# Patient Record
Sex: Male | Born: 1935 | Race: White | Hispanic: No | Marital: Married | Smoking: Former smoker
Health system: Southern US, Community
[De-identification: ages and names within clinical notes are randomized; demographics above are authoritative.]

## PROBLEM LIST (undated history)

## (undated) DIAGNOSIS — N400 Enlarged prostate without lower urinary tract symptoms: Secondary | ICD-10-CM

## (undated) DIAGNOSIS — G8929 Other chronic pain: Secondary | ICD-10-CM

## (undated) DIAGNOSIS — K635 Polyp of colon: Secondary | ICD-10-CM

## (undated) DIAGNOSIS — M545 Low back pain: Secondary | ICD-10-CM

## (undated) DIAGNOSIS — K222 Esophageal obstruction: Secondary | ICD-10-CM

## (undated) DIAGNOSIS — M1990 Unspecified osteoarthritis, unspecified site: Secondary | ICD-10-CM

## (undated) DIAGNOSIS — K219 Gastro-esophageal reflux disease without esophagitis: Secondary | ICD-10-CM

## (undated) DIAGNOSIS — G458 Other transient cerebral ischemic attacks and related syndromes: Secondary | ICD-10-CM

## (undated) DIAGNOSIS — E785 Hyperlipidemia, unspecified: Secondary | ICD-10-CM

## (undated) DIAGNOSIS — J339 Nasal polyp, unspecified: Secondary | ICD-10-CM

## (undated) DIAGNOSIS — I10 Essential (primary) hypertension: Secondary | ICD-10-CM

## (undated) HISTORY — DX: Nasal polyp, unspecified: J33.9

## (undated) HISTORY — DX: Polyp of colon: K63.5

## (undated) HISTORY — DX: Esophageal obstruction: K22.2

## (undated) HISTORY — DX: Other transient cerebral ischemic attacks and related syndromes: G45.8

## (undated) HISTORY — PX: JOINT REPLACEMENT: SHX530

## (undated) HISTORY — DX: Benign prostatic hyperplasia without lower urinary tract symptoms: N40.0

## (undated) HISTORY — PX: COLONOSCOPY W/ POLYPECTOMY: SHX1380

## (undated) HISTORY — PX: CATARACT EXTRACTION W/ INTRAOCULAR LENS  IMPLANT, BILATERAL: SHX1307

## (undated) HISTORY — DX: Essential (primary) hypertension: I10

## (undated) HISTORY — DX: Hyperlipidemia, unspecified: E78.5

---

## 1995-07-25 DIAGNOSIS — G458 Other transient cerebral ischemic attacks and related syndromes: Secondary | ICD-10-CM

## 1995-07-25 HISTORY — DX: Other transient cerebral ischemic attacks and related syndromes: G45.8

## 1995-07-25 HISTORY — PX: SUBCLAVIAN STENT PLACEMENT: SUR1038

## 1997-07-24 HISTORY — PX: GANGLION CYST EXCISION: SHX1691

## 1998-07-24 HISTORY — PX: PROSTATE BIOPSY: SHX241

## 1998-11-26 ENCOUNTER — Other Ambulatory Visit: Admission: RE | Admit: 1998-11-26 | Discharge: 1998-11-26 | Payer: Self-pay | Admitting: Urology

## 1999-07-25 HISTORY — PX: KNEE ARTHROSCOPY: SUR90

## 2000-01-31 ENCOUNTER — Ambulatory Visit (HOSPITAL_BASED_OUTPATIENT_CLINIC_OR_DEPARTMENT_OTHER): Admission: RE | Admit: 2000-01-31 | Discharge: 2000-01-31 | Payer: Self-pay | Admitting: Orthopedic Surgery

## 2001-07-24 HISTORY — PX: ESOPHAGEAL DILATION: SHX303

## 2001-08-20 ENCOUNTER — Encounter: Payer: Self-pay | Admitting: Orthopedic Surgery

## 2001-08-20 ENCOUNTER — Encounter: Admission: RE | Admit: 2001-08-20 | Discharge: 2001-08-20 | Payer: Self-pay | Admitting: Orthopedic Surgery

## 2001-08-22 ENCOUNTER — Encounter (INDEPENDENT_AMBULATORY_CARE_PROVIDER_SITE_OTHER): Payer: Self-pay | Admitting: *Deleted

## 2001-08-22 ENCOUNTER — Ambulatory Visit (HOSPITAL_BASED_OUTPATIENT_CLINIC_OR_DEPARTMENT_OTHER): Admission: RE | Admit: 2001-08-22 | Discharge: 2001-08-22 | Payer: Self-pay | Admitting: Orthopedic Surgery

## 2004-09-27 ENCOUNTER — Ambulatory Visit: Payer: Self-pay | Admitting: Gastroenterology

## 2004-10-13 ENCOUNTER — Ambulatory Visit: Payer: Self-pay | Admitting: Gastroenterology

## 2004-12-12 ENCOUNTER — Ambulatory Visit: Payer: Self-pay | Admitting: Internal Medicine

## 2005-01-09 ENCOUNTER — Ambulatory Visit: Payer: Self-pay | Admitting: Internal Medicine

## 2005-03-20 ENCOUNTER — Ambulatory Visit: Payer: Self-pay | Admitting: Internal Medicine

## 2005-04-10 ENCOUNTER — Ambulatory Visit: Payer: Self-pay

## 2005-05-15 ENCOUNTER — Ambulatory Visit: Payer: Self-pay | Admitting: Internal Medicine

## 2005-08-23 ENCOUNTER — Ambulatory Visit: Payer: Self-pay | Admitting: Internal Medicine

## 2005-12-19 ENCOUNTER — Ambulatory Visit: Payer: Self-pay | Admitting: Internal Medicine

## 2006-01-02 ENCOUNTER — Ambulatory Visit (HOSPITAL_BASED_OUTPATIENT_CLINIC_OR_DEPARTMENT_OTHER): Admission: RE | Admit: 2006-01-02 | Discharge: 2006-01-02 | Payer: Self-pay | Admitting: Orthopedic Surgery

## 2006-01-02 ENCOUNTER — Encounter (INDEPENDENT_AMBULATORY_CARE_PROVIDER_SITE_OTHER): Payer: Self-pay | Admitting: Specialist

## 2006-01-16 ENCOUNTER — Ambulatory Visit: Payer: Self-pay | Admitting: Internal Medicine

## 2006-12-04 ENCOUNTER — Encounter: Payer: Self-pay | Admitting: Internal Medicine

## 2006-12-04 ENCOUNTER — Ambulatory Visit: Payer: Self-pay | Admitting: Internal Medicine

## 2006-12-12 DIAGNOSIS — K222 Esophageal obstruction: Secondary | ICD-10-CM | POA: Insufficient documentation

## 2006-12-12 DIAGNOSIS — R972 Elevated prostate specific antigen [PSA]: Secondary | ICD-10-CM | POA: Insufficient documentation

## 2006-12-12 DIAGNOSIS — Z872 Personal history of diseases of the skin and subcutaneous tissue: Secondary | ICD-10-CM | POA: Insufficient documentation

## 2006-12-12 DIAGNOSIS — R42 Dizziness and giddiness: Secondary | ICD-10-CM | POA: Insufficient documentation

## 2006-12-12 DIAGNOSIS — R7989 Other specified abnormal findings of blood chemistry: Secondary | ICD-10-CM | POA: Insufficient documentation

## 2006-12-12 DIAGNOSIS — Z9889 Other specified postprocedural states: Secondary | ICD-10-CM | POA: Insufficient documentation

## 2007-04-11 ENCOUNTER — Ambulatory Visit: Payer: Self-pay | Admitting: Internal Medicine

## 2007-04-11 DIAGNOSIS — E782 Mixed hyperlipidemia: Secondary | ICD-10-CM | POA: Insufficient documentation

## 2007-04-11 DIAGNOSIS — K219 Gastro-esophageal reflux disease without esophagitis: Secondary | ICD-10-CM | POA: Insufficient documentation

## 2007-04-11 DIAGNOSIS — I10 Essential (primary) hypertension: Secondary | ICD-10-CM | POA: Insufficient documentation

## 2007-04-11 DIAGNOSIS — N400 Enlarged prostate without lower urinary tract symptoms: Secondary | ICD-10-CM | POA: Insufficient documentation

## 2007-04-15 ENCOUNTER — Encounter (INDEPENDENT_AMBULATORY_CARE_PROVIDER_SITE_OTHER): Payer: Self-pay | Admitting: *Deleted

## 2007-04-16 ENCOUNTER — Ambulatory Visit: Payer: Self-pay | Admitting: Gastroenterology

## 2007-04-17 ENCOUNTER — Ambulatory Visit: Payer: Self-pay | Admitting: Internal Medicine

## 2007-04-18 ENCOUNTER — Encounter (INDEPENDENT_AMBULATORY_CARE_PROVIDER_SITE_OTHER): Payer: Self-pay | Admitting: *Deleted

## 2007-04-23 ENCOUNTER — Telehealth (INDEPENDENT_AMBULATORY_CARE_PROVIDER_SITE_OTHER): Payer: Self-pay | Admitting: *Deleted

## 2007-05-15 ENCOUNTER — Ambulatory Visit: Payer: Self-pay | Admitting: Gastroenterology

## 2007-05-15 DIAGNOSIS — K449 Diaphragmatic hernia without obstruction or gangrene: Secondary | ICD-10-CM | POA: Insufficient documentation

## 2007-06-27 ENCOUNTER — Ambulatory Visit: Payer: Self-pay | Admitting: Internal Medicine

## 2007-07-01 LAB — CONVERTED CEMR LAB
ALT: 18 units/L (ref 0–53)
AST: 17 units/L (ref 0–37)
Cholesterol: 165 mg/dL (ref 0–200)
HDL: 45.5 mg/dL (ref >39.0)
Hgb A1c MFr Bld: 5.9 % (ref 4.6–6.0)
LDL Cholesterol: 91 mg/dL (ref 0–99)
Total CHOL/HDL Ratio: 3.6
Triglycerides: 145 mg/dL (ref 0–149)
VLDL: 29 mg/dL (ref 0–40)

## 2007-09-21 ENCOUNTER — Encounter: Payer: Self-pay | Admitting: Internal Medicine

## 2007-10-14 ENCOUNTER — Encounter: Payer: Self-pay | Admitting: Internal Medicine

## 2007-10-25 ENCOUNTER — Encounter: Admission: RE | Admit: 2007-10-25 | Discharge: 2007-10-25 | Payer: Self-pay | Admitting: Sports Medicine

## 2007-11-13 ENCOUNTER — Encounter: Payer: Self-pay | Admitting: Internal Medicine

## 2007-11-25 ENCOUNTER — Encounter: Payer: Self-pay | Admitting: Internal Medicine

## 2007-12-12 ENCOUNTER — Telehealth (INDEPENDENT_AMBULATORY_CARE_PROVIDER_SITE_OTHER): Payer: Self-pay | Admitting: *Deleted

## 2007-12-30 ENCOUNTER — Ambulatory Visit: Payer: Self-pay | Admitting: Internal Medicine

## 2008-01-06 LAB — CONVERTED CEMR LAB
ALT: 18 units/L (ref 0–53)
AST: 20 units/L (ref 0–37)
Cholesterol: 164 mg/dL (ref 0–200)
HDL: 41.7 mg/dL (ref >39.0)
Hgb A1c MFr Bld: 6.2 % — ABNORMAL HIGH (ref 4.6–6.0)
LDL Cholesterol: 91 mg/dL (ref 0–99)
Total CHOL/HDL Ratio: 3.9
Triglycerides: 159 mg/dL — ABNORMAL HIGH (ref 0–149)
VLDL: 32 mg/dL (ref 0–40)

## 2008-03-16 ENCOUNTER — Telehealth (INDEPENDENT_AMBULATORY_CARE_PROVIDER_SITE_OTHER): Payer: Self-pay | Admitting: *Deleted

## 2008-03-17 ENCOUNTER — Telehealth: Payer: Self-pay | Admitting: Gastroenterology

## 2008-06-04 ENCOUNTER — Ambulatory Visit: Payer: Self-pay | Admitting: Internal Medicine

## 2008-06-04 DIAGNOSIS — M129 Arthropathy, unspecified: Secondary | ICD-10-CM | POA: Insufficient documentation

## 2008-06-04 DIAGNOSIS — M48061 Spinal stenosis, lumbar region without neurogenic claudication: Secondary | ICD-10-CM | POA: Insufficient documentation

## 2008-06-08 ENCOUNTER — Ambulatory Visit: Payer: Self-pay | Admitting: Internal Medicine

## 2008-06-16 ENCOUNTER — Encounter (INDEPENDENT_AMBULATORY_CARE_PROVIDER_SITE_OTHER): Payer: Self-pay | Admitting: *Deleted

## 2008-06-16 LAB — CONVERTED CEMR LAB
ALT: 23 units/L (ref 0–53)
AST: 26 units/L (ref 0–37)
Albumin: 4.3 g/dL (ref 3.5–5.2)
Alkaline Phosphatase: 62 units/L (ref 39–117)
BUN: 24 mg/dL — ABNORMAL HIGH (ref 6–23)
Basophils Absolute: 0 10*3/uL (ref 0.0–0.1)
Basophils Relative: 0.3 % (ref 0.0–3.0)
Bilirubin, Direct: 0.1 mg/dL (ref 0.0–0.3)
Cholesterol: 152 mg/dL (ref 0–200)
Creatinine, Ser: 1.3 mg/dL (ref 0.4–1.5)
Eosinophils Absolute: 0.2 10*3/uL (ref 0.0–0.7)
Eosinophils Relative: 4 % (ref 0.0–5.0)
HCT: 41 % (ref 39.0–52.0)
HDL: 44.8 mg/dL (ref >39.0)
Hemoglobin: 14.5 g/dL (ref 13.0–17.0)
Hgb A1c MFr Bld: 6 % (ref 4.6–6.0)
LDL Cholesterol: 88 mg/dL (ref 0–99)
Lymphocytes Relative: 34.5 % (ref 12.0–46.0)
MCHC: 35.3 g/dL (ref 30.0–36.0)
MCV: 93.2 fL (ref 78.0–100.0)
Monocytes Absolute: 0.3 10*3/uL (ref 0.1–1.0)
Monocytes Relative: 7.3 % (ref 3.0–12.0)
Neutro Abs: 2.4 10*3/uL (ref 1.4–7.7)
Neutrophils Relative %: 53.9 % (ref 43.0–77.0)
Platelets: 186 10*3/uL (ref 150–400)
Potassium: 5 meq/L (ref 3.5–5.1)
RBC: 4.4 M/uL (ref 4.22–5.81)
RDW: 12.7 % (ref 11.5–14.6)
TSH: 2.35 microintl units/mL (ref 0.35–5.50)
Total Bilirubin: 1 mg/dL (ref 0.3–1.2)
Total CHOL/HDL Ratio: 3.4
Total Protein: 7.4 g/dL (ref 6.0–8.3)
Triglycerides: 96 mg/dL (ref 0–149)
Uric Acid, Serum: 6.9 mg/dL (ref 4.0–7.8)
VLDL: 19 mg/dL (ref 0–40)
WBC: 4.4 10*3/uL — ABNORMAL LOW (ref 4.5–10.5)

## 2008-07-24 HISTORY — PX: TOTAL SHOULDER ARTHROPLASTY: SHX126

## 2008-10-02 ENCOUNTER — Telehealth (INDEPENDENT_AMBULATORY_CARE_PROVIDER_SITE_OTHER): Payer: Self-pay | Admitting: *Deleted

## 2008-10-05 ENCOUNTER — Ambulatory Visit: Payer: Self-pay | Admitting: Gastroenterology

## 2008-10-07 ENCOUNTER — Encounter: Payer: Self-pay | Admitting: Internal Medicine

## 2008-10-19 ENCOUNTER — Ambulatory Visit: Payer: Self-pay | Admitting: Gastroenterology

## 2008-10-19 LAB — HM COLONOSCOPY: HM Colonoscopy: ABNORMAL

## 2008-10-20 ENCOUNTER — Encounter: Payer: Self-pay | Admitting: Gastroenterology

## 2008-10-26 ENCOUNTER — Encounter: Payer: Self-pay | Admitting: Internal Medicine

## 2008-11-06 ENCOUNTER — Encounter: Payer: Self-pay | Admitting: Internal Medicine

## 2008-11-24 ENCOUNTER — Telehealth (INDEPENDENT_AMBULATORY_CARE_PROVIDER_SITE_OTHER): Payer: Self-pay | Admitting: *Deleted

## 2008-12-01 ENCOUNTER — Ambulatory Visit: Payer: Self-pay | Admitting: Gastroenterology

## 2008-12-01 DIAGNOSIS — K5900 Constipation, unspecified: Secondary | ICD-10-CM | POA: Insufficient documentation

## 2008-12-01 DIAGNOSIS — Z86010 Personal history of colonic polyps: Secondary | ICD-10-CM | POA: Insufficient documentation

## 2008-12-01 LAB — CONVERTED CEMR LAB
Ferritin: 69.5 ng/mL (ref 22.0–322.0)
Folate: 8.5 ng/mL
Iron: 103 ug/dL (ref 42–165)
Saturation Ratios: 27.2 % (ref 20.0–50.0)
Transferrin: 270.5 mg/dL (ref 212.0–360.0)
Vitamin B-12: 552 pg/mL (ref 211–911)

## 2009-01-19 ENCOUNTER — Encounter: Payer: Self-pay | Admitting: Internal Medicine

## 2009-01-22 ENCOUNTER — Ambulatory Visit: Payer: Self-pay | Admitting: Internal Medicine

## 2009-01-22 DIAGNOSIS — M1990 Unspecified osteoarthritis, unspecified site: Secondary | ICD-10-CM | POA: Insufficient documentation

## 2009-02-01 ENCOUNTER — Encounter: Admission: RE | Admit: 2009-02-01 | Discharge: 2009-02-01 | Payer: Self-pay | Admitting: Family Medicine

## 2009-02-01 ENCOUNTER — Encounter: Payer: Self-pay | Admitting: Internal Medicine

## 2009-02-02 ENCOUNTER — Encounter (INDEPENDENT_AMBULATORY_CARE_PROVIDER_SITE_OTHER): Payer: Self-pay | Admitting: *Deleted

## 2009-02-10 ENCOUNTER — Encounter: Payer: Self-pay | Admitting: Internal Medicine

## 2009-02-16 ENCOUNTER — Inpatient Hospital Stay (HOSPITAL_COMMUNITY): Admission: RE | Admit: 2009-02-16 | Discharge: 2009-02-18 | Payer: Self-pay | Admitting: Orthopedic Surgery

## 2009-03-04 ENCOUNTER — Encounter: Payer: Self-pay | Admitting: Internal Medicine

## 2009-03-31 ENCOUNTER — Encounter: Payer: Self-pay | Admitting: Internal Medicine

## 2009-04-28 ENCOUNTER — Encounter: Payer: Self-pay | Admitting: Internal Medicine

## 2009-06-28 ENCOUNTER — Encounter: Payer: Self-pay | Admitting: Internal Medicine

## 2009-07-07 ENCOUNTER — Telehealth (INDEPENDENT_AMBULATORY_CARE_PROVIDER_SITE_OTHER): Payer: Self-pay | Admitting: *Deleted

## 2009-07-13 ENCOUNTER — Telehealth (INDEPENDENT_AMBULATORY_CARE_PROVIDER_SITE_OTHER): Payer: Self-pay | Admitting: *Deleted

## 2009-07-29 ENCOUNTER — Encounter: Payer: Self-pay | Admitting: Internal Medicine

## 2009-09-15 ENCOUNTER — Telehealth (INDEPENDENT_AMBULATORY_CARE_PROVIDER_SITE_OTHER): Payer: Self-pay | Admitting: *Deleted

## 2009-10-07 ENCOUNTER — Ambulatory Visit: Payer: Self-pay | Admitting: Internal Medicine

## 2009-10-11 LAB — CONVERTED CEMR LAB
ALT: 22 units/L (ref 0–53)
AST: 21 units/L (ref 0–37)
Albumin: 4.7 g/dL (ref 3.5–5.2)
Alkaline Phosphatase: 68 units/L (ref 39–117)
BUN: 25 mg/dL — ABNORMAL HIGH (ref 6–23)
Basophils Absolute: 0 10*3/uL (ref 0.0–0.1)
Basophils Relative: 0.7 % (ref 0.0–3.0)
Bilirubin, Direct: 0.2 mg/dL (ref 0.0–0.3)
CO2: 31 meq/L (ref 19–32)
Calcium: 9.5 mg/dL (ref 8.4–10.5)
Chloride: 103 meq/L (ref 96–112)
Cholesterol: 151 mg/dL (ref 0–200)
Creatinine, Ser: 1.2 mg/dL (ref 0.4–1.5)
Eosinophils Absolute: 0.2 10*3/uL (ref 0.0–0.7)
Eosinophils Relative: 2.7 % (ref 0.0–5.0)
GFR calc non Af Amer: 62.33 mL/min (ref >60)
Glucose, Bld: 116 mg/dL — ABNORMAL HIGH (ref 70–99)
HCT: 42.1 % (ref 39.0–52.0)
HDL: 53.6 mg/dL (ref >39.00)
Hemoglobin: 14 g/dL (ref 13.0–17.0)
Hgb A1c MFr Bld: 6.1 % (ref 4.6–6.5)
LDL Cholesterol: 71 mg/dL (ref 0–99)
Lymphocytes Relative: 24.2 % (ref 12.0–46.0)
Lymphs Abs: 1.4 10*3/uL (ref 0.7–4.0)
MCHC: 33.4 g/dL (ref 30.0–36.0)
MCV: 95.1 fL (ref 78.0–100.0)
Monocytes Absolute: 0.4 10*3/uL (ref 0.1–1.0)
Monocytes Relative: 7 % (ref 3.0–12.0)
Neutro Abs: 3.6 10*3/uL (ref 1.4–7.7)
Neutrophils Relative %: 65.4 % (ref 43.0–77.0)
Platelets: 173 10*3/uL (ref 150.0–400.0)
Potassium: 4.6 meq/L (ref 3.5–5.1)
RBC: 4.42 M/uL (ref 4.22–5.81)
RDW: 13 % (ref 11.5–14.6)
Sodium: 140 meq/L (ref 135–145)
TSH: 3.1 microintl units/mL (ref 0.35–5.50)
Total Bilirubin: 1.1 mg/dL (ref 0.3–1.2)
Total CHOL/HDL Ratio: 3
Total Protein: 7.4 g/dL (ref 6.0–8.3)
Triglycerides: 131 mg/dL (ref 0.0–149.0)
VLDL: 26.2 mg/dL (ref 0.0–40.0)
WBC: 5.6 10*3/uL (ref 4.5–10.5)

## 2009-10-28 ENCOUNTER — Encounter: Payer: Self-pay | Admitting: Internal Medicine

## 2009-12-27 ENCOUNTER — Telehealth (INDEPENDENT_AMBULATORY_CARE_PROVIDER_SITE_OTHER): Payer: Self-pay | Admitting: *Deleted

## 2010-01-17 ENCOUNTER — Encounter: Payer: Self-pay | Admitting: Internal Medicine

## 2010-01-28 ENCOUNTER — Telehealth: Payer: Self-pay | Admitting: Gastroenterology

## 2010-02-25 ENCOUNTER — Encounter: Payer: Self-pay | Admitting: Internal Medicine

## 2010-08-14 ENCOUNTER — Encounter: Payer: Self-pay | Admitting: Sports Medicine

## 2010-08-21 LAB — CONVERTED CEMR LAB
ALT: 20 units/L (ref 0–53)
ALT: 20 units/L (ref 0–53)
AST: 16 units/L (ref 0–37)
AST: 21 units/L (ref 0–37)
Albumin: 3.8 g/dL (ref 3.5–5.2)
Alkaline Phosphatase: 64 units/L (ref 39–117)
BUN: 22 mg/dL (ref 6–23)
BUN: 24 mg/dL — ABNORMAL HIGH (ref 6–23)
Basophils Absolute: 0 10*3/uL (ref 0.0–0.1)
Basophils Relative: 0.5 % (ref 0.0–1.0)
Bilirubin, Direct: 0.1 mg/dL (ref 0.0–0.3)
CO2: 30 meq/L (ref 19–32)
Calcium: 9.2 mg/dL (ref 8.4–10.5)
Chloride: 104 meq/L (ref 96–112)
Cholesterol, target level: 200 mg/dL
Cholesterol: 188 mg/dL (ref 0–200)
Creatinine, Ser: 0.9 mg/dL (ref 0.4–1.5)
Creatinine, Ser: 1 mg/dL (ref 0.4–1.5)
Eosinophils Absolute: 0.1 10*3/uL (ref 0.0–0.6)
Eosinophils Relative: 1.7 % (ref 0.0–5.0)
GFR calc Af Amer: 94 mL/min
GFR calc non Af Amer: 78 mL/min
Glucose, Bld: 111 mg/dL — ABNORMAL HIGH (ref 70–99)
HCT: 41.6 % (ref 39.0–52.0)
HDL goal, serum: 40 mg/dL
HDL: 44.3 mg/dL (ref >39.0)
Hemoglobin: 14.4 g/dL (ref 13.0–17.0)
Hgb A1c MFr Bld: 6 % (ref 4.6–6.5)
Hgb A1c MFr Bld: 6.2 % — ABNORMAL HIGH (ref 4.6–6.0)
LDL Cholesterol: 116 mg/dL — ABNORMAL HIGH (ref 0–99)
LDL Goal: 130 mg/dL
Lymphocytes Relative: 19.6 % (ref 12.0–46.0)
MCHC: 34.6 g/dL (ref 30.0–36.0)
MCV: 92.4 fL (ref 78.0–100.0)
Monocytes Absolute: 0.4 10*3/uL (ref 0.2–0.7)
Monocytes Relative: 5.3 % (ref 3.0–11.0)
Neutro Abs: 4.9 10*3/uL (ref 1.4–7.7)
Neutrophils Relative %: 72.9 % (ref 43.0–77.0)
Platelets: 189 10*3/uL (ref 150–400)
Potassium: 4.6 meq/L (ref 3.5–5.1)
Potassium: 4.7 meq/L (ref 3.5–5.1)
RBC: 4.5 M/uL (ref 4.22–5.81)
RDW: 12.7 % (ref 11.5–14.6)
Sodium: 139 meq/L (ref 135–145)
Total Bilirubin: 1 mg/dL (ref 0.3–1.2)
Total CHOL/HDL Ratio: 4.2
Total Protein: 6.9 g/dL (ref 6.0–8.3)
Triglycerides: 137 mg/dL (ref 0–149)
VLDL: 27 mg/dL (ref 0–40)
WBC: 6.7 10*3/uL (ref 4.5–10.5)

## 2010-08-23 NOTE — Letter (Signed)
Summary: Letter Concerning Spinal Orthosis Device/[facility]  Letter Concerning Spinal Orthosis Device/[facility]   Imported By: Lanelle Bal 06/09/2008 09:06:26  _____________________________________________________________________  External Attachment:    Type:   Image     Comment:   External Document

## 2010-08-23 NOTE — Letter (Signed)
Summary: [facility]  [facility]   Imported By: Lanelle Bal 11/15/2009 11:45:35  _____________________________________________________________________  External Attachment:    Type:   Image     Comment:   External Document

## 2010-08-23 NOTE — Procedures (Signed)
Summary: Colonoscopy   Colonoscopy  Procedure date:  10/19/2008  Findings:      Location:  [facility].    Procedures Next Due Date:    Colonoscopy: 10/2013  COLONOSCOPY PROCEDURE REPORT  PATIENT:  Joshua, Lopez  MR#:  [id number] BIRTHDATE:   09/10/1929, 78 yrs. old   GENDER:   male  ENDOSCOPIST:   Vania Rea. Jarold Motto, MD, [facility] Referred by: Marga Melnick, M.D.  PROCEDURE DATE:  10/19/2008 PROCEDURE:  Colonoscopy with snare polypectomy ASA CLASS:   Class II INDICATIONS: history of hyperplastic polyps   MEDICATIONS:    Fentanyl 50 mcg IV, Versed 4 mg IV  DESCRIPTION OF PROCEDURE:   After the risks benefits and alternatives of the procedure were thoroughly explained, informed consent was obtained.  Digital rectal exam was performed and revealed no abnormalities.   The LB [id number] endoscope was introduced through the anus and advanced to the cecum, which was identified by both the appendix and ileocecal valve, without limitations.  The quality of the prep was good, using MoviPrep.  The instrument was then slowly withdrawn as the colon was fully examined. <<PROCEDUREIMAGES>>      <<OLD IMAGES>>  FINDINGS:  A sessile polyp was found in the distal transverse colon. Polyp was snared without cautery. Retrieval was successful. snare polyp  This was otherwise a normal examination of the colon. PERIANAL PAPILLAE AND SMALL INTERNAL HEMORRHOIDS NOTED.   Retroflexed views in the rectum revealed no abnormalities.    The scope was then withdrawn from the patient and the procedure completed.  COMPLICATIONS:   None  ENDOSCOPIC IMPRESSION:  1) Sessile polyp in the distal transverse colon  2) Otherwise normal examination  PROBABLE HYPERPLASTIC POLYP.I DOUBT HE HAS ADENOMAS. RECOMMENDATIONS:  1) await pathology results  REPEAT EXAM:   No   _______________________________ Vania Rea. Jarold Motto, MD, [facility]  CC:        REPORT OF SURGICAL PATHOLOGY   Case #:  [id number] Patient Name: Joshua Lopez, Joshua Lopez. Office Chart Number:  [id number]   MRN: [id number] Pathologist: Beulah Gandy. Luisa Hart, MD DOB/Age  01-28-1930 (Age: 27)    Gender: M Date Taken:  10/19/2008 Date Received: 10/19/2008   FINAL DIAGNOSIS   ***MICROSCOPIC EXAMINATION AND DIAGNOSIS***   COLON, TRANSVERSE POLYP:  TUBULAR ADENOMA.  NO HIGH GRADE DYSPLASIA OR MALIGNANCY IDENTIFIED.   mw Date Reported:  10/20/2008     Beulah Gandy. Luisa Hart, MD *** Electronically Signed Out By JDP ***     October 20, 2008 MRN: [id number]    [facility] [address] [place], [place]  [address]    Dear Joshua Lopez,  I am pleased to inform you that the colon polyp(s) removed during your recent colonoscopy was (were) found to be benign (no cancer detected) upon pathologic examination.  I recommend you have a repeat colonoscopy examination in 5 years to look for recurrent polyps, as having colon polyps increases your risk for having recurrent polyps or even colon cancer in the future. Your small polyp was an adenoma. Depending on your health in 5 years we will consider repeat exam. However, at this time I see no need for further followup currently.  Should you develop new or worsening symptoms of abdominal pain, bowel habit changes or bleeding from the rectum or bowels, please schedule an evaluation with either your primary care physician or with me.  Additional information/recommendations:  __ No further action with gastroenterology is needed at this time. Please      follow-up with your primary  care physician for your other healthcare      needs.  __ Please call [phone] to schedule a return visit to review your      situation.  __ Please keep your follow-up visit as already scheduled.  _xx_ Continue treatment plan as outlined the day of your exam.  Please call us if you are having persistent problems or have questions about your condition that have not been fully answered at this time.   Sincerely,  Mardella Layman MD [facility]  This letter has been electronically signed by your physician.   This report was created from the original endoscopy report, which was reviewed and signed by the above listed endoscopist.

## 2010-08-23 NOTE — Letter (Signed)
Summary: Results Follow up Letter  [facility] [place]  [address] [place], [place] [address]   Phone: [phone]  Fax: [phone]    02/02/2009 MRN: [id number]  Joshua Lopez [address] [place], [place]  [address]  Dear Mr. Stella,  The following are the results of your recent test(s):  Test         Result    Pap Smear:        Normal _____  Not Normal _____ Comments: ______________________________________________________ Cholesterol: LDL(Bad cholesterol):         Your goal is less than:         HDL (Good cholesterol):       Your goal is more than: Comments:  ______________________________________________________ Mammogram:        Normal _____  Not Normal _____ Comments:  ___________________________________________________________________ Hemoccult:        Normal _____  Not normal _______ Comments:    _____________________________________________________________________ Other Tests: PLEASE COPY OF LABS FROM 01/22/09 AND COMMENTS    We routinely do not discuss normal results over the telephone.  If you desire a copy of the results, or you have any questions about this information we can discuss them at your next office visit.   Sincerely,

## 2010-08-23 NOTE — Assessment & Plan Note (Signed)
Summary: surgery clearance//fd   Vital Signs:  Patient profile:   77 year old male Height:      69.5 inches Weight:      211.38 pounds Temp:     97.7 degrees F oral Pulse rate:   78 / minute Resp:     16 per minute BP sitting:   114 / 68  (left arm)  Vitals Entered By: Ardyth Man (January 22, 2009 11:41 AM) CC: surgery clearance, Pre-op Evaluation Is Patient Diabetic? No Pain Assessment Patient in pain? no       Have you ever been in a relationship where you felt threatened, hurt or afraid?No   Does patient need assistance? Functional Status Self care Ambulation Normal   Primary Care Provider:  Marga Melnick, MD  CC:  surgery clearance and Pre-op Evaluation.  History of Present Illness: He has seen Dr Farris Has & Dr Kellie Simmering for shoulder syndrome with intermittent positional pain & chronic soreness @ rest; injections did not help.Dr Kellie Simmering referred to  Dr Shelle Iron who  recommended surgery; he preferred Dr Sherene Sires group  (Dr Dion Saucier) peform surgery. PMH & Dr Jinny Sanders Urology eval & Dr Norval Gable colonoscopy findings this year  reviewed. (elevated PSA,BPH, sessile transverse colon polyp). End stage OA of shoulder diagnosed by Dr Kellie Simmering.  Pre-Op Evaluation      I  saw  this delightful patient today for Pre-op Evaluation.  The patient denies respiratory symptoms, GI bleeding, chest pain, edema, PND, heavy ETOH use, and smoking.  Patient has no history of unstable or severe angina and decompensated CHF.  Positive PMH placing the patient at moderate risk for surgery includes advanced age(l).  Patient has no history of mild angina(m), previous MI(m), compensated CHF(m), diabetes(m), renal insufficiency(m), abnormal ECG(l), rhythm other than sinus(l), low functional capacity(l), stroke history(l), and uncontrolled HTN(l).  There is no history of antiplatelet agents, chronic steroids, warfarin, diabetes meds, antianginal meds, and bleeding disorder.    Allergies: 1)  ! Asa  Past  History:  Past Medical History: Esophageal stricture ; fasting hyperglycemia Hypertension Benign prostatic hypertrophy;Elevated PSA, Dr Darvin Neighbours Colonic polyps, hx of 2006 & 2010 Hyperlipidemia  Past Surgical History: RIGHT SUBCLAVIAN STENOSIS 1997 PROSTATE BIOSPY-11/1998 Colon polypectomy 2006 &  2010, Dr Karie Georges 1999  Review of Systems General:  Denies fatigue and weight loss. ENT:  Denies difficulty swallowing and hoarseness. CV:  Denies leg cramps with exertion and shortness of breath with exertion. GI:  Denies bloody stools, dark tarry stools, and indigestion. MS:  See HPI; Complains of joint pain and low back pain; denies joint redness, joint swelling, mid back pain, cramps, muscle weakness, and thoracic pain. Derm:  Denies poor wound healing. Neuro:  Denies brief paralysis, disturbances in coordination, and weakness; L ulnar radicular symptoms; ? tennis elbow. Endo:  Denies excessive hunger, excessive thirst, and excessive urination.  Physical Exam  General:  Appears younger than age,well-nourished,in no acute distress; alert,appropriate and cooperative throughout examination Neck:  No deformities, masses, or tenderness noted. Lungs:  Normal respiratory effort, chest expands symmetrically. Lungs are clear to auscultation, no crackles or wheezes. Heart:  Normal rate and regular rhythm. S1 and S2 normal without gallop, click, rub . R subclavian bruit with R carotid & R heart base radiation Abdomen:  Bowel sounds positive,abdomen soft and non-tender without masses, organomegaly or hernias noted. Pulses:  R and L carotid,radial,dorsalis pedis and posterior tibial pulses are full and equal bilaterally. See Heart Extremities:  Pain with superior ROM of R  shoulder Neurologic:  alert & oriented X3 and DTRs symmetrical and normal.   Skin:  Intact without suspicious lesions or rashes Cervical Nodes:  No lymphadenopathy noted Axillary Nodes:  No palpable  lymphadenopathy Psych:  memory intact for recent and remote, normally interactive, and good eye contact.     Impression & Recommendations:  Problem # 1:  DEGENERATIVE JOINT DISEASE, ADVANCED (ICD-715.90)  His updated medication list for this problem includes:    Tramadol Hcl 50 Mg Tabs (Tramadol hcl) .Marland Kitchen... Take 1-2 q 6-8 hrs as needed  1 q 6-8 hrs as needed arthritis    Tylenol Ex St Arthritis Pain 500 Mg Tabs (Acetaminophen) .Marland Kitchen..Marland Kitchen Two tablets by mouth four times a day  Problem # 2:  HYPERTENSION, ESSENTIAL NOS (ICD-401.9)  Controlled His updated medication list for this problem includes:    Norvasc 5 Mg Tabs (Amlodipine besylate) .Marland Kitchen... 1 by mouth qd    Benazepril-hydrochlorothiazide 20-25 Mg Tabs (Benazepril-hydrochlorothiazide) .Marland Kitchen... 1 tablet by mouth once daily  Orders: EKG w/ Interpretation ([address]) Venipuncture ([address]) TLB-Creatinine, Blood (82565-CREA) TLB-Potassium (K+) (84132-K) TLB-ALT (SGPT) (84460-ALT) TLB-AST (SGOT) (84450-SGOT) TLB-BUN (Urea Nitrogen) (84520-BUN)  Problem # 3:  HYPERLIPIDEMIA (ICD-272.2)  His updated medication list for this problem includes:    Pravastatin Sodium 20 Mg Tabs (Pravastatin sodium) .Marland Kitchen... Take one tablet daily.  Orders: EKG w/ Interpretation (93000) TLB-ALT (SGPT) (84460-ALT) TLB-AST (SGOT) (84450-SGOT)  Problem # 4:  HYPERGLYCEMIA, HX OF (ICD-790.6)  Orders: TLB-A1C / Hgb A1C (Glycohemoglobin) (83036-A1C)  Problem # 5:  PSA, INCREASED (ICD-790.93) as per Dr Earlene Plater  Complete Medication List: 1)  Norvasc 5 Mg Tabs (Amlodipine besylate) .Marland Kitchen.. 1 by mouth qd 2)  Benazepril-hydrochlorothiazide 20-25 Mg Tabs (Benazepril-hydrochlorothiazide) .Marland Kitchen.. 1 tablet by mouth once daily 3)  Fish Oil Oil (Fish oil) .... 3 tabs daily 4)  Saw Palmetto  5)  Vit B12  6)  Pravastatin Sodium 20 Mg Tabs (Pravastatin sodium) .... Take one tablet daily. 7)  Protonix 40 Mg Tbec (Pantoprazole sodium) .Marland Kitchen.. 1 each day 30 minutes before meal 8)  Calcium  630mg /vit-d 400iu  .Marland Kitchen.. 1 by mouth two times a day 9)  Tramadol Hcl 50 Mg Tabs (Tramadol hcl) .... Take 1-2 q 6-8 hrs as needed  1 q 6-8 hrs as needed arthritis 10)  Tylenol Ex St Arthritis Pain 500 Mg Tabs (Acetaminophen) .... Two tablets by mouth four times a day  Patient Instructions: 1)  Make any corrections; take to Dr Dion Saucier with EKG copy. You are cleared for surgery

## 2010-08-23 NOTE — Letter (Signed)
Summary: Results Follow up Letter  [name] [place]  [address] [place], [place] [address]   Phone: [phone]  Fax: [phone]    04/18/2007 MRN: [id number]  Joshua Lopez [address] [place], [place]  [address]  Dear Mr. [name],  The following are the results of your recent test(s):  Test         Result    Pap Smear:        Normal _____  Not Normal _____ Comments: ______________________________________________________ Cholesterol: LDL(Bad cholesterol):         Your goal is less than:         HDL (Good cholesterol):       Your goal is more than: Comments:  ______________________________________________________ Mammogram:        Normal _____  Not Normal _____ Comments:  ___________________________________________________________________ Hemoccult:        Normal _X____  Not normal _______ Comments:    _____________________________________________________________________ Other Tests:    We routinely do not discuss normal results over the telephone.  If you desire a copy of the results, or you have any questions about this information we can discuss them at your next office visit.   Sincerely,

## 2010-08-23 NOTE — Assessment & Plan Note (Signed)
Summary: YEARLY/CDJ   Vital Signs:  Patient profile:   77 year old male Height:      70 inches Weight:      211.6 pounds BMI:     30.47 Temp:     97.4 degrees F oral Pulse rate:   66 / minute Resp:     16 per minute BP sitting:   156 / 85  (left arm) Cuff size:   regular  Vitals Entered By: Shonna Chock (October 07, 2009 8:37 AM) CC: Yearly follow-up, Last EKG was 01/2009, Hypertension Management, Lipid Management Comments REVIEWED MED LIST, PATIENT AGREED DOSE AND INSTRUCTION CORRECT    Primary Care Provider:  Marga Melnick, MD  CC:  Yearly follow-up, Last EKG was 01/2009, Hypertension Management, and Lipid Management.  History of Present Illness: See BP; BP 105/75  & 107/64 earlier this week. CVE as walking & machines @  gym 2-4X/week for 1 hr w/o symptoms.No diet. Last EKG pre op shoulder surgery, 9 months ago  Hypertension History:      He denies headache, chest pain, palpitations, dyspnea with exertion, orthopnea, PND, peripheral edema, visual symptoms, neurologic problems, syncope, and side effects from treatment.  He notes no problems with any antihypertensive medication side effects.        Positive major cardiovascular risk factors include male age 7 years old or older, hyperlipidemia, and hypertension.  Negative major cardiovascular risk factors include no history of diabetes, negative family history for ischemic heart disease, and non-tobacco-user status.        Further assessment for target organ damage reveals no history of ASHD, stroke/TIA, or peripheral vascular disease.    Lipid Management History:      Positive NCEP/ATP III risk factors include male age 28 years old or older and hypertension.  Negative NCEP/ATP III risk factors include non-diabetic, no family history for ischemic heart disease, non-tobacco-user status, no ASHD (atherosclerotic heart disease), no prior stroke/TIA, no peripheral vascular disease, and no history of aortic aneurysm.       Allergies: 1)  ! Jonne Ply  Past History:  Past Surgical History: RIGHT SUBCLAVIAN STENOSIS 1997 PROSTATE BIOSPY 2000, Dr Earlene Plater; Esophageal Dilation Colon polypectomy 2006 &  2010, Dr Jarold Motto GANGLIONECTOMY  LUE 1999; Total R shoulder , Dr Dion Saucier 2010  Family History: Father: murdered Mother: HTN,skin CA Siblings: bro skin CA, HTN; bro CBAG;  P uncle colon CA; PGM breast CA; P aunt breast CA; P aunt  ECT,colon CA; P uncle MI   Social History: no diet Retired, former Academic librarian Married Alcohol use-no Regular exercise-yes  Review of Systems Eyes:  Denies blurring, double vision, and vision loss-both eyes. ENT:  Complains of difficulty swallowing; denies hoarseness; Dysphagia 4X/year; "not enough for endoscopy". CV:  Denies leg cramps with exertion. GI:  Denies abdominal pain, bloody stools, dark tarry stools, and indigestion; Rapiflow caused constipation. GU:  He sees Dr Earlene Plater  annually. MS:  Complains of joint pain and low back pain; denies joint redness, joint swelling, mid back pain, and thoracic pain; L shoulder pain. Derm:  Denies lesion(s). Neuro:  Denies numbness and tingling. Endo:  Denies cold intolerance, excessive hunger, excessive thirst, excessive urination, and heat intolerance.  Physical Exam  General:  well-nourished; alert,appropriate and cooperative throughout examination; appears younger than age Neck:  No deformities, masses, or tenderness noted. Lungs:  Normal respiratory effort, chest expands symmetrically. Lungs are clear to auscultation, no crackles or wheezes. Heart:  normal rate, no murmur, no gallop, no rub, no JVD, no HJR, and  grade  1/2 -1/6 systolic murmur.   Abdomen:  Bowel sounds positive,abdomen soft and non-tender without masses, organomegaly or hernias noted. Genitalia:  Dr Earlene Plater Pulses:  R and L carotid,radial,dorsalis pedis and posterior tibial pulses are full and equal bilaterally Extremities:  No clubbing, cyanosis, edema. Flexion  &lateral deviation of fingers; crepitus of L > R shoulder Neurologic:  alert & oriented X3 and DTRs symmetrical and normal.   Skin:  Intact without suspicious lesions or rashes Cervical Nodes:  No lymphadenopathy noted Axillary Nodes:  No palpable lymphadenopathy Psych:  memory intact for recent and remote, normally interactive, and good eye contact.     Impression & Recommendations:  Problem # 1:  HYPERTENSION, ESSENTIAL NOS (ICD-401.9)  His updated medication list for this problem includes:    Norvasc 5 Mg Tabs (Amlodipine besylate) .Marland Kitchen... 1 by mouth qd  Orders: Venipuncture ([address]) TLB-BMP (Basic Metabolic Panel-BMET) (80048-METABOL)  Problem # 2:  HYPERLIPIDEMIA (ICD-272.2)  His updated medication list for this problem includes:    Pravastatin Sodium 20 Mg Tabs (Pravastatin sodium) .Marland Kitchen... Take one tablet daily.  Orders: Venipuncture ([address]) TLB-Lipid Panel (80061-LIPID) TLB-Hepatic/Liver Function Pnl (80076-HEPATIC) TLB-TSH (Thyroid Stimulating Hormone) (84443-TSH)  Problem # 3:  HYPERGLYCEMIA, HX OF (ICD-790.6)  Orders: Venipuncture ([address]11) TLB-A1C / Hgb A1C (Glycohemoglobin) (83036-A1C)  Problem # 4:  DEGENERATIVE JOINT DISEASE, ADVANCED (ICD-715.90)  His updated medication list for this problem includes:    Tramadol Hcl 50 Mg Tabs (Tramadol hcl) .Marland Kitchen... Take 1-2 q 6-8 hrs as needed  1 q 6-8 hrs as needed arthritis    Tylenol Ex St Arthritis Pain 500 Mg Tabs (Acetaminophen) .Marland Kitchen..Marland Kitchen Two tablets by mouth four times a day  Problem # 5:  COLONIC POLYPS, HX OF (ICD-V12.72)  as per Dr Jarold Motto  Orders: TLB-CBC Platelet - w/Differential (85025-CBCD)  Complete Medication List: 1)  Norvasc 5 Mg Tabs (Amlodipine besylate) .Marland Kitchen.. 1 by mouth qd 2)  Benazepril-hydrochlorothiazide 40-25 Mg Tabs  .Marland Kitchen.. 1 tablet by mouth once daily 3)  Fish Oil Oil (Fish oil) .... 3 tabs daily 4)  Saw Palmetto  5)  Vit B12  6)  Pravastatin Sodium 20 Mg Tabs (Pravastatin sodium) .... Take one  tablet daily. 7)  Protonix 40 Mg Tbec (Pantoprazole sodium) .Marland Kitchen.. 1 each day 30 minutes before meal 8)  Calcium 630mg /vit-d 400iu  .Marland Kitchen.. 1 by mouth two times a day 9)  Tramadol Hcl 50 Mg Tabs (Tramadol hcl) .... Take 1-2 q 6-8 hrs as needed  1 q 6-8 hrs as needed arthritis 10)  Tylenol Ex St Arthritis Pain 500 Mg Tabs (Acetaminophen) .... Two tablets by mouth four times a day 11)  Rapaflo 4 Mg Caps (Silodosin) .Marland Kitchen.. 1 by mouth at bedtime  Hypertension Assessment/Plan:      The patient's hypertensive risk group is category B: At least one risk factor (excluding diabetes) with no target organ damage.  His calculated 10 year risk of coronary heart disease is 18 %.  Today's blood pressure is 156/85.    Lipid Assessment/Plan:      Based on NCEP/ATP III, the patient's risk factor category is "2 or more risk factors and a calculated 10 year CAD risk of < 20%".  The patient's lipid goals are as follows: Total cholesterol goal is 200; LDL cholesterol goal is 130; HDL cholesterol goal is 40; Triglyceride goal is 150.  His LDL cholesterol goal has been met.    Patient Instructions: 1)  Check your Blood Pressure regularly. If it is above: 140/90 ON AVERAGE  you should make an appointment.Take BP cuff to ALL doctor visits. Prescriptions: PRAVASTATIN SODIUM 20 MG  TABS (PRAVASTATIN SODIUM) Take one tablet daily.  #90 x 3   Entered and Authorized by:   Marga Melnick MD   Signed by:   Marga Melnick MD on 10/07/2009   Method used:   Printed then faxed to ...       [facility]* (mail-order)             ,          Ph: [phone]       Fax: [phone]   RxID:   [id number] NORVASC 5 MG  TABS (AMLODIPINE BESYLATE) 1 by mouth qd  #90 x 3   Entered and Authorized by:   Marga Melnick MD   Signed by:   Marga Melnick MD on 10/07/2009   Method used:   Printed then faxed to ...       [facility]* (mail-order)             ,          Ph: [phone]       Fax: [phone]   RxID:    [id number] BENAZEPRIL-HYDROCHLOROTHIAZIDE  40-25 MG TABS 1 tablet by mouth once daily  #90 x 3   Entered and Authorized by:   Marga Melnick MD   Signed by:   Marga Melnick MD on 10/07/2009   Method used:   Printed then faxed to ...       [facility]* (mail-order)             ,          Ph: [phone]       Fax: [phone]   RxID:   [id number]

## 2010-08-23 NOTE — Letter (Signed)
Summary: [facility]  [facility]   Imported By: Lanelle Bal 04/12/2009 11:32:11  _____________________________________________________________________  External Attachment:    Type:   Image     Comment:   External Document

## 2010-08-23 NOTE — Letter (Signed)
Summary: [facility]  [facility]   Imported By: Lanelle Bal 10/12/2009 12:46:14  _____________________________________________________________________  External Attachment:    Type:   Image     Comment:   External Document

## 2010-08-23 NOTE — Letter (Signed)
Summary: [facility]  [facility]   Imported By: Lanelle Bal 01/31/2010 09:19:58  _____________________________________________________________________  External Attachment:    Type:   Image     Comment:   External Document

## 2010-08-23 NOTE — Progress Notes (Signed)
Summary: Refill Request  Phone Note Refill Request Message from:  Patient  Refills Requested: Medication #1:  BENAZEPRIL-HYDROCHLOROTHIAZIDE 20-25 MG TABS 1 tablet by mouth once daily [facility]   Method Requested: Fax to Mail Away Pharmacy Initial call taken by: Shonna Chock,  July 13, 2009 1:40 PM    Prescriptions: BENAZEPRIL-HYDROCHLOROTHIAZIDE 20-25 MG TABS (BENAZEPRIL-HYDROCHLOROTHIAZIDE) 1 tablet by mouth once daily  #90 x 0   Entered by:   Shonna Chock   Authorized by:   Marga Melnick MD   Signed by:   Shonna Chock on 07/13/2009   Method used:   Faxed to ...       [facility]* (mail-order)             ,          Ph: [phone]       Fax: [phone]   RxID:   [id number]

## 2010-08-23 NOTE — Letter (Signed)
Summary: [facility]  [facility]   Imported By: Lanelle Bal 03/07/2010 12:41:34  _____________________________________________________________________  External Attachment:    Type:   Image     Comment:   External Document

## 2010-08-23 NOTE — Progress Notes (Signed)
Summary: Refill Request  Phone Note Refill Request Message from:  Patient on [facility] T: [phone]  Refills Requested: Medication #1:  NORVASC 5 MG  TABS 1 by mouth qd   Dosage confirmed as above?Dosage Confirmed   Brand Name Necessary? No   Supply Requested: 3 months Patient's wife called and said her husband would run out before his next appointment in March. Can we fax this refill to [facility]?  Next Appointment Scheduled: 3.17.2011 Initial call taken by: Harold Barban,  September 15, 2009 2:01 PM    Prescriptions: NORVASC 5 MG  TABS (AMLODIPINE BESYLATE) 1 by mouth qd  #90 x 0   Entered by:   Shonna Chock   Authorized by:   Marga Melnick MD   Signed by:   Shonna Chock on 09/15/2009   Method used:   Electronically to        [facility]* (mail-order)             ,          Ph: [phone]       Fax: [phone]   RxID:   [id number]

## 2010-08-23 NOTE — Letter (Signed)
Summary: [facility]  [facility]   Imported By: Lanelle Bal 03/12/2009 15:11:02  _____________________________________________________________________  External Attachment:    Type:   Image     Comment:   External Document

## 2010-08-23 NOTE — Assessment & Plan Note (Signed)
   Vital Signs:  Patient Profile:   77 Years Old Male Weight:      206 pounds Temp:     97 degrees F oral Pulse rate:   84 / minute BP sitting:   110 / 62  (left arm)  Pt. in pain?   no  Vitals Entered By: Doristine Devoid (Dec 04, 2006 2:21 PM)                Chief Complaint:  Dizziness on and off.  History of Present Illness: onset 11/26/06 slept in camper bed ;head of bed tilted down; 5/6 & 5/7 felt swimmy head upon awakening;lasted 1 minute; no vertigo       Review of Systems  General      Denies chills, fever, and sweats.  ENT      Complains of nasal congestion and postnasal drainage.      Denies decreased hearing, earache, and ringing in ears.      no facial pain,fever,purulence  Neuro      Complains of poor balance.      Denies falling down, headaches, inability to speak, numbness, sensation of room spinning, tingling, and visual disturbances.  Allergy      Complains of sneezing.      Rx Equate Sinus   Physical Exam  Eyes:     arcus senilis;no nystagmus Ears:     before meals > BC;no lateralization Nose:     External nasal examination shows no deformity or inflammation. Nasal mucosa are pink and moist without lesions or exudates. Mouth:     Oral mucosa and oropharynx without lesions or exudates.  Teeth in good repair. Neck:     R bruit Neurologic:     No cranial nerve deficits noted. Station and gait are normal. Plantar reflexes are down-going bilaterally. DTRs are symmetrical throughout. Sensory, motor and coordinative functions appear intact.    Impression & Recommendations:  Problem # 1:  dizziness  Problem # 2:  PMH subclavian steal syn   Patient Instructions: 1)  avoid excess salt; Meclizine 25 mg  1/2 -1 Q 6-8 hrs as needed;report if no better

## 2010-08-23 NOTE — Assessment & Plan Note (Signed)
Summary: yearly check up   Vital Signs:  Patient Profile:   77 Years Old Male Height:     69.5 inches Weight:      208 pounds Temp:     97.8 degrees F oral Pulse rate:   76 / minute Resp:     15 per minute BP sitting:   124 / 78  (left arm) Cuff size:   large  Vitals Entered By: Shonna Chock (June 04, 2008 10:49 AM)                 Chief Complaint:  YEARLY CHECK-UP and NOT FASTING TODAY. DISCUSS RIGHT SHOULDER PAIN-? .  History of Present Illness: See Lipid & HTN Management panels.  Hypertension History:      He denies headache, chest pain, palpitations, dyspnea with exertion, orthopnea, PND, peripheral edema, visual symptoms, neurologic problems, syncope, and side effects from treatment.  He notes no problems with any antihypertensive medication side effects.  Further comments include: BP 112/76.        Positive major cardiovascular risk factors include male age 62 years old or older, hyperlipidemia, and hypertension.  Negative major cardiovascular risk factors include negative family history for ischemic heart disease and non-tobacco-user status.        Further assessment for target organ damage reveals no history of ASHD, stroke/TIA, or peripheral vascular disease.    Lipid Management History:      Positive NCEP/ATP III risk factors include male age 21 years old or older and hypertension.  Negative NCEP/ATP III risk factors include no family history for ischemic heart disease, non-tobacco-user status, no ASHD (atherosclerotic heart disease), no prior stroke/TIA, no peripheral vascular disease, and no history of aortic aneurysm.        Current Allergies (reviewed today): ! ASA  Past Medical History:    Esophageal stricture ; hyperglycemia    Hypertension    Benign prostatic hypertrophy;Elevated PSA, Dr Darvin Neighbours    Colonic polyps, hx of    Hyperlipidemia  Past Surgical History:    RIGHT SUBCLAVIAN STENOSIS 1997    PROSTATE BIOSPY-11/1998    Colon polypectomy  2006, repeat 2010, Dr Karie Georges 1999   Family History:    Father: neg    Mother: HTN,skin CA    Siblings: bro skin CA, HTN; bro CBAG;  P uncle colon CA; PGM breast CA; P aunt breast CA; P aunt  ECT,colon CA; P uncle MI  Social History:    no diet    Retired    Married    Alcohol use-no    Regular exercise-yes   Risk Factors:  Alcohol use:  no Exercise:  yes  Family History Risk Factors:    Family History of MI in females < 19 years old:  no    Family History of MI in males < 63 years old:  no   Review of Systems  General      Denies fatigue and weight loss.  Eyes      Denies blurring, double vision, and vision loss-both eyes.  ENT      Denies difficulty swallowing and hoarseness.  CV      Denies bluish discoloration of lips or nails and leg cramps with exertion.  Resp      Denies cough and sputum productive.  GI      Complains of indigestion.      Denies abdominal pain, bloody stools, and dark tarry stools.      PPI  controls ERD  GU      He saw Dr Earlene Plater 3/09  MS      Complains of joint pain.      Denies joint redness and joint swelling.      Dr Thurston Hole Rxed R shoulder injection  Derm      Denies changes in nail beds, dryness, and hair loss.  Neuro      Denies numbness and tingling.      [place]cca dizziness , better than pre SSS surgery in 1997  Endo      Denies cold intolerance, excessive hunger, excessive thirst, excessive urination, heat intolerance, polyuria, and weight change.   Physical Exam  General:     in no acute distress; alert,appropriate and cooperative throughout examination; appears younger Neck:     No deformities, masses, or tenderness noted. Lungs:     Normal respiratory effort, chest expands symmetrically. Lungs are clear to auscultation, no crackles or wheezes. Heart:     normal rate, regular rhythm, no gallop, no rub, no JVD, no HJR, and grade 1 /6 systolic murmur.   Abdomen:     Bowel sounds  positive,abdomen soft and non-tender without masses, organomegaly. Ventral hernia  noted. Genitalia:     Dr Earlene Plater Prostate:     Dr Earlene Plater Pulses:     R and L carotid,radial,dorsalis pedis and posterior tibial pulses are full and equal bilaterally. R carotid rumble Extremities:     No clubbing, cyanosis, edema. Severe mixed hand deformities  noted with normal full range of motion of all joints.   Neurologic:     alert & oriented X3 and DTRs symmetrical and normal.   Skin:     Intact without suspicious lesions or rashes Cervical Nodes:     No lymphadenopathy noted Psych:     normally interactive, good eye contact, not anxious appearing, and not depressed appearing.      Impression & Recommendations:  Problem # 1:  ARTHRITIS, GENERALIZED (ICD-716.99)  Problem # 2:  HYPERTENSION, ESSENTIAL NOS (ICD-401.9)  His updated medication list for this problem includes:    Norvasc 5 Mg Tabs (Amlodipine besylate) .Marland Kitchen... 1 by mouth qd    Benazepril Hcl 40 Mg Tabs (Benazepril hcl) .Marland Kitchen... 1/2 by mouth once daily  Orders: EKG w/ Interpretation (93000)   Problem # 3:  HYPERLIPIDEMIA (ICD-272.2)  His updated medication list for this problem includes:    Pravachol 20 Mg Tabs (Pravastatin sodium) .Marland Kitchen... 1 qhs    Pravastatin Sodium 20 Mg Tabs (Pravastatin sodium) .Marland Kitchen... Take one tablet daily.   Problem # 4:  HYPERGLYCEMIA, HX OF (ICD-790.6)  Complete Medication List: 1)  Doxycycline Hyclate 100 Mg Tabs (Doxycycline hyclate) .Marland Kitchen.. 1 by mouth bid 2)  Norvasc 5 Mg Tabs (Amlodipine besylate) .Marland Kitchen.. 1 by mouth qd 3)  Benazepril Hcl 40 Mg Tabs (Benazepril hcl) .... 1/2 by mouth once daily 4)  Fish Oil Oil (Fish oil) .... 3 tabs daily 5)  Ginkgo Biloba Qd  6)  Saw Palmetto  7)  Vit B12  8)  Pravachol 20 Mg Tabs (Pravastatin sodium) .Marland Kitchen.. 1 qhs 9)  Pravastatin Sodium 20 Mg Tabs (Pravastatin sodium) .... Take one tablet daily. 10)  Protonix 40 Mg Tbec (Pantoprazole sodium) .Marland Kitchen.. 1 each day 30 minutes  before meal 11)  Calcium 630mg /vit-d 400iu  .Marland Kitchen.. 1 by mouth two times a day 12)  Tramadol Hcl 50 Mg Tabs (Tramadol hcl) .Marland Kitchen.. 1 q 6-8 hrs as needed arthritis  Other Orders: Flu Vaccine 86yrs + (01751)  Administration Flu vaccine (G0008)  Hypertension Assessment/Plan:      The patient's hypertensive risk group is category B: At least one risk factor (excluding diabetes) with no target organ damage.  His calculated 10 year risk of coronary heart disease is 11 %.  Today's blood pressure is 124/78.    Lipid Assessment/Plan:      Based on NCEP/ATP III, the patient's risk factor category is "2 or more risk factors and a calculated 10 year CAD risk of < 20%".  From this information, the patient's calculated lipid goals are as follows: Total cholesterol goal is 200; LDL cholesterol goal is 130; HDL cholesterol goal is 40; Triglyceride goal is 150.  His LDL cholesterol goal has been met.     Patient Instructions: 1)  BUN,creat,K+ prior to visit, ICD-9:401.9 2)  Hepatic Panel prior to visit, ICD-9:995.20 3)  Lipid Panel prior to visit, ICD-9:272.4 4)  TSH prior to visit, ICD-9:272.4 5)  CBC w/ Diff prior to visit, ICD-9:211.3 6)  HbgA1C prior to visit, ICD-9:790.29; RA, uric acid, 716.99   Prescriptions: TRAMADOL HCL 50 MG TABS (TRAMADOL HCL) 1 q 6-8 hrs as needed arthritis  #30 x 1   Entered and Authorized by:   Marga Melnick MD   Signed by:   Marga Melnick MD on 06/04/2008   Method used:   Print then Give to Patient   RxID:   [id number] PRAVACHOL 20 MG  TABS (PRAVASTATIN SODIUM) 1 qhs  #90 x 3   Entered and Authorized by:   Marga Melnick MD   Signed by:   Marga Melnick MD on 06/04/2008   Method used:   Print then Give to Patient   RxID:   [id number] BENAZEPRIL HCL 40 MG TABS (BENAZEPRIL HCL) 1/2 by mouth once daily  #90 x 1   Entered and Authorized by:   Marga Melnick MD   Signed by:   Marga Melnick MD on 06/04/2008   Method used:   Print then Give to Patient   RxID:    [id number] NORVASC 5 MG  TABS (AMLODIPINE BESYLATE) 1 by mouth qd  #90 x 3   Entered and Authorized by:   Marga Melnick MD   Signed by:   Marga Melnick MD on 06/04/2008   Method used:   Print then Give to Patient   RxID:   [id number]  ] Flu Vaccine Consent Questions     Do you have a history of severe allergic reactions to this vaccine? no    Any prior history of allergic reactions to egg and/or gelatin? no    Do you have a sensitivity to the preservative Thimersol? no    Do you have a past history of Guillan-Barre Syndrome? no    Do you currently have an acute febrile illness? no    Have you ever had a severe reaction to latex? no    Vaccine information given and explained to patient? yes    Are you currently pregnant? no    Lot Number:[id number]   Exp Date:01/20/2009   Site Given  Right Deltoid IM

## 2010-08-23 NOTE — Consult Note (Signed)
Summary: Stacey Drain, MD Rheumatology  Stacey Drain, MD Rheumatology   Imported By: Lanelle Bal 10/16/2008 15:07:38  _____________________________________________________________________  External Attachment:    Type:   Image     Comment:   External Document

## 2010-08-23 NOTE — Progress Notes (Signed)
Summary: amlodipine and pravastatin   Phone Note Refill Request Message from:  Patient on July 07, 2009 4:20 PM  Refills Requested: Medication #1:  NORVASC 5 MG  TABS 1 by mouth qd  Medication #2:  PRAVASTATIN SODIUM 20 MG  TABS Take one tablet daily. Initial call taken by: Doristine Devoid,  July 07, 2009 4:20 PM  Follow-up for Phone Call        spoke w/ patient wife aware prescription sent to [facility] and that he due for yearly exam so appt scheduled ..............Marland KitchenDoristine Devoid  July 07, 2009 4:22 PM     Prescriptions: PRAVASTATIN SODIUM 20 MG  TABS (PRAVASTATIN SODIUM) Take one tablet daily.  #90 x 0   Entered by:   Doristine Devoid   Authorized by:   Marga Melnick MD   Signed by:   Doristine Devoid on 07/07/2009   Method used:   Faxed to ...       [facility]* (mail-order)             ,          Ph: [phone]       Fax: [phone]   RxID:   [id number] NORVASC 5 MG  TABS (AMLODIPINE BESYLATE) 1 by mouth qd  #90 x 0   Entered by:   Doristine Devoid   Authorized by:   Marga Melnick MD   Signed by:   Doristine Devoid on 07/07/2009   Method used:   Faxed to ...       [facility]* (mail-order)             ,          Ph: [phone]       Fax: [phone]   RxID:   [id number]

## 2010-08-23 NOTE — Procedures (Signed)
Summary: Gastroenterology colon  Gastroenterology colon   Imported By: Milford Cage CMA 10/07/2007 14:26:46  _____________________________________________________________________  External Attachment:    Type:   Image     Comment:   External Document

## 2010-08-23 NOTE — Letter (Signed)
Summary: Results Follow up Letter  [facility]  [address] [place], [place] [address]   Phone: [phone]  Fax: [phone]    06/16/2008 MRN: [id number]  LEELYNN WHETSEL [address] [place], [place]  [address]  Dear Mr. Sorter,  The following are the results of your recent test(s):  Test         Result    Pap Smear:        Normal _____  Not Normal _____ Comments: ______________________________________________________ Cholesterol: LDL(Bad cholesterol):         Your goal is less than:         HDL (Good cholesterol):       Your goal is more than: Comments:  ______________________________________________________ Mammogram:        Normal _____  Not Normal _____ Comments:  ___________________________________________________________________ Hemoccult:        Normal _____  Not normal _______ Comments:    _____________________________________________________________________ Other Tests: PLEASE SEE ATTACHED LABS FROM 06/08/08 AND COMMENTS    We routinely do not discuss normal results over the telephone.  If you desire a copy of the results, or you have any questions about this information we can discuss them at your next office visit.   Sincerely,

## 2010-08-23 NOTE — Letter (Signed)
Summary: [facility]  [facility]   Imported By: Lanelle Bal 08/10/2009 11:48:43  _____________________________________________________________________  External Attachment:    Type:   Image     Comment:   External Document

## 2010-08-23 NOTE — Progress Notes (Signed)
Summary: RX  Phone Note Call from Patient Call back at Home Phone [phone]   Caller: Spouse/faye Call For: Adalynn Corne Reason for Call: Refill Medication, Talk to Nurse Details for Reason: RX Summary of Call: needs a new RX-protonix-uses mail order 2yr supply please mail to home address  Initial call taken by: Guadlupe Spanish [facility],  March 17, 2008 11:16 AM  Follow-up for Phone Call        rx sent pts wife aware. Follow-up by: Harlow Mares CMA,  March 17, 2008 11:26 AM    New/Updated Medications: PROTONIX 40 MG  TBEC (PANTOPRAZOLE SODIUM) 1 each day 30 minutes before meal   Prescriptions: PROTONIX 40 MG  TBEC (PANTOPRAZOLE SODIUM) 1 each day 30 minutes before meal  #90 x 3   Entered by:   Harlow Mares CMA   Authorized by:   Mardella Layman MD FACG,FAGA   Signed by:   Harlow Mares CMA on 03/17/2008   Method used:   Electronically to        [facility] (mail-order)             ,          Ph: [phone]       Fax: [phone]   RxID:   [id number]

## 2010-08-23 NOTE — Progress Notes (Signed)
Summary: pravastatin-dr hopperi  Phone Note Call from Patient Call back at Home Phone [phone]   Caller: Patient Summary of Call: pt received lab result with suggestion to take pravastatin 20mg  - please call in to [facility] [address] Initial call taken by: Okey Regal [place],  April 23, 2007 3:03 PM  Follow-up for Phone Call        Pt. aware and rxs called into [facility]. ...................................................................Ardyth Man  April 23, 2007 4:06 PM  Follow-up by: Ardyth Man,  April 23, 2007 4:06 PM    New/Updated Medications: PRAVASTATIN SODIUM 20 MG  TABS (PRAVASTATIN SODIUM) Take one tablet daily.   Prescriptions: PRAVASTATIN SODIUM 20 MG  TABS (PRAVASTATIN SODIUM) Take one tablet daily.  #30 x 3   Entered by:   Ardyth Man   Authorized by:   Marga Melnick MD   Signed by:   Ardyth Man on 04/23/2007   Method used:   Telephoned to ...       [facility]  [address]  [phone]       [address]       [place], [place]  [address]       Ph: [phone] or [phone]       Fax: [phone]   RxID:   [id number]

## 2010-08-23 NOTE — Letter (Signed)
Summary: [facility]  [facility]   Imported By: Lanelle Bal 07/03/2009 09:41:25  _____________________________________________________________________  External Attachment:    Type:   Image     Comment:   External Document

## 2010-08-23 NOTE — Letter (Signed)
Summary: [facility]   Imported By: Freddy Jaksch 10/14/2007 10:05:39  _____________________________________________________________________  External Attachment:    Type:   Image     Comment:   External Document

## 2010-08-23 NOTE — Progress Notes (Signed)
Summary: Medication  Phone Note Call from Patient Call back at Home Phone [phone]   Caller: Patient Call For: Dr. Jarold Motto Reason for Call: Refill Medication Summary of Call: pt. needs a refill on his Pantoprazole sent to [facility]....requesting a year supply Initial call taken by: Karna Christmas,  January 28, 2010 4:21 PM  Follow-up for Phone Call        Rx sent as requested. Follow-up by: Ashok Cordia RN,  January 31, 2010 8:08 AM    New/Updated Medications: PROTONIX 40 MG  TBEC (PANTOPRAZOLE SODIUM) 1 each day 30 minutes before meal Prescriptions: PROTONIX 40 MG  TBEC (PANTOPRAZOLE SODIUM) 1 each day 30 minutes before meal  #90 x 3   Entered by:   Ashok Cordia RN   Authorized by:   Mardella Layman MD [facility]   Signed by:   Ashok Cordia RN on 01/31/2010   Method used:   Electronically to        [facility] (retail)             ,          Ph: [phone]       Fax: [phone]   RxID:   [id number]

## 2010-08-23 NOTE — Progress Notes (Signed)
Summary: Request for STAT referral  Phone Note Call from Patient Call back at Home Phone [phone]   Caller: Spouse-Faye Summary of Call: Patient with ongoing right shoulder pain(seen in 05/2008). Patient was seen at [facility] and was told athritis (nothing to be done about it). Patient would like a referral (STAT if Possible) to Dr.Truslow [phone]), on a scale of 1-10 (10) pain level.   Dr.Hopper do you agree with patient's request?  ***Ok to leave information on machine if Wife not avaliable*** Initial call taken by: Shonna Chock,  October 02, 2008 9:26 AM  Follow-up for Phone Call        see prednisone 10 mg Rx; also referral Follow-up by: Marga Melnick,  October 02, 2008 9:49 AM  Additional Follow-up for Phone Call Additional follow up Details #1::        left msg rx Prednisone  faxed to [facility] [address], also referral coordinator will be contacting in ref to referral .Kandice Hams  October 02, 2008 11:24 AM  Additional Follow-up by: Kandice Hams,  October 02, 2008 11:24 AM    New/Updated Medications: PREDNISONE 10 MG TABS (PREDNISONE) 1 three times a day with food   Prescriptions: PREDNISONE 10 MG TABS (PREDNISONE) 1 three times a day with food  #15 x 0   Entered by:   Kandice Hams   Authorized by:   Marga Melnick   Signed by:   Kandice Hams on 10/02/2008   Method used:   Faxed to ...       [facility]  [address]  [phone]* (retail)       [address]       [place], [place]  [address]       Ph: [phone] or [phone]       Fax: [phone]   RxID:   [id number]

## 2010-08-23 NOTE — Letter (Signed)
Summary: Results Follow up Letter  [facility] [place]  [address] [place], [place] [address]   Phone: [phone]  Fax: [phone]    04/15/2007 MRN: [id number]  Joshua Lopez [address] [place], [place]  [address]  Dear [name],  The following are the results of your recent test(s):  Test         Result    Pap Smear:        Normal _____  Not Normal _____ Comments: ______________________________________________________ Cholesterol: LDL(Bad cholesterol):         Your goal is less than:         HDL (Good cholesterol):       Your goal is more than: Comments:  ______________________________________________________ Mammogram:        Normal _____  Not Normal _____ Comments:  ___________________________________________________________________ Hemoccult:        Normal _____  Not normal _______ Comments:    _____________________________________________________________________ Other Tests:  Please see attached results and comments   We routinely do not discuss normal results over the telephone.  If you desire a copy of the results, or you have any questions about this information we can discuss them at your next office visit.   Sincerely,

## 2010-08-23 NOTE — Letter (Signed)
Summary: [facility]  [facility]   Imported By: Lanelle Bal 02/10/2009 12:11:03  _____________________________________________________________________  External Attachment:    Type:   Image     Comment:   External Document

## 2010-08-23 NOTE — Progress Notes (Signed)
Summary: Refill Request  Phone Note Refill Request Call back at [phone] Message from:  Pharmacy on December 27, 2009 9:41 AM  Refills Requested: Medication #1:  NORVASC 5 MG  TABS 1 by mouth qd   Dosage confirmed as above?Dosage Confirmed   Brand Name Necessary? No   Supply Requested: 3 months [facility]  Next Appointment Scheduled: none Initial call taken by: Harold Barban,  December 27, 2009 9:42 AM    Prescriptions: NORVASC 5 MG  TABS (AMLODIPINE BESYLATE) 1 by mouth qd  #90 x 2   Entered by:   Shonna Chock   Authorized by:   Marga Melnick MD   Signed by:   Shonna Chock on 12/27/2009   Method used:   Faxed to ...       [facility]* (mail-order)             ,          Ph: [phone]       Fax: [phone]   RxID:   [id number]

## 2010-08-30 ENCOUNTER — Telehealth (INDEPENDENT_AMBULATORY_CARE_PROVIDER_SITE_OTHER): Payer: Self-pay | Admitting: *Deleted

## 2010-09-08 NOTE — Progress Notes (Signed)
Summary: refill  Phone Note Refill Request Message from:  Patient  Refills Requested: Medication #1:  BENAZEPRIL-HYDROCHLOROTHIAZIDE  40-25 MG TABS 1 tablet by mouth once daily  Medication #2:  PROTONIX 40 MG  TBEC 1 each day 30 minutes before meal  Medication #3:  NORVASC 5 MG  TABS 1 by mouth qd [facility] [address] ---   Next Appointment Scheduled: cpx scheduled [id number]  Initial call taken by: Okey Regal [place],  August 30, 2010 8:41 AM    Prescriptions: PROTONIX 40 MG  TBEC (PANTOPRAZOLE SODIUM) 1 each day 30 minutes before meal  #90 x 0   Entered by:   Shonna Chock CMA   Authorized by:   Marga Melnick MD   Signed by:   Shonna Chock CMA on 08/30/2010   Method used:   Electronically to        [facility] [phone]* (retail)       [address]       [place], [place]  [address]       Ph: [phone]       Fax: [phone]   RxID:   [id number] NORVASC 5 MG  TABS (AMLODIPINE BESYLATE) 1 by mouth qd  #90 x 0   Entered by:   Shonna Chock CMA   Authorized by:   Marga Melnick MD   Signed by:   Shonna Chock CMA on 08/30/2010   Method used:   Electronically to        [facility] [phone]* (retail)       [address]       [place], [place]  [address]       Ph: [phone]       Fax: [phone]   RxID:   [id number] BENAZEPRIL-HYDROCHLOROTHIAZIDE  40-25 MG TABS 1 tablet by mouth once daily  #90 x 0   Entered by:   Shonna Chock CMA   Authorized by:   Marga Melnick MD   Signed by:   Shonna Chock CMA on 08/30/2010   Method used:   Faxed to ...       [facility] [address] [phone]* (retail)       [address]       [place], [place]  [address]       Ph: [phone]       Fax: [phone]   RxID:   [id number]

## 2010-09-17 ENCOUNTER — Encounter: Payer: Self-pay | Admitting: Internal Medicine

## 2010-10-30 LAB — CBC
HCT: 34.1 % — ABNORMAL LOW (ref 39.0–52.0)
HCT: 40.6 % (ref 39.0–52.0)
Hemoglobin: 11.9 g/dL — ABNORMAL LOW (ref 13.0–17.0)
Hemoglobin: 14 g/dL (ref 13.0–17.0)
MCHC: 34.5 g/dL (ref 30.0–36.0)
MCHC: 34.9 g/dL (ref 30.0–36.0)
MCV: 92.6 fL (ref 78.0–100.0)
MCV: 92.8 fL (ref 78.0–100.0)
Platelets: 137 10*3/uL — ABNORMAL LOW (ref 150–400)
Platelets: 192 10*3/uL (ref 150–400)
RBC: 3.69 MIL/uL — ABNORMAL LOW (ref 4.22–5.81)
RBC: 4.37 MIL/uL (ref 4.22–5.81)
RDW: 13 % (ref 11.5–15.5)
RDW: 13.1 % (ref 11.5–15.5)
WBC: 5.2 10*3/uL (ref 4.0–10.5)
WBC: 6 10*3/uL (ref 4.0–10.5)

## 2010-10-30 LAB — URINALYSIS, ROUTINE W REFLEX MICROSCOPIC
Bilirubin Urine: NEGATIVE
Glucose, UA: NEGATIVE mg/dL
Hgb urine dipstick: NEGATIVE
Ketones, ur: NEGATIVE mg/dL
Nitrite: NEGATIVE
Protein, ur: NEGATIVE mg/dL
Specific Gravity, Urine: 1.026 (ref 1.005–1.030)
Urobilinogen, UA: 0.2 mg/dL (ref 0.0–1.0)
pH: 5 (ref 5.0–8.0)

## 2010-10-30 LAB — ABO/RH: ABO/RH(D): B POS

## 2010-10-30 LAB — BASIC METABOLIC PANEL
BUN: 14 mg/dL (ref 6–23)
BUN: 22 mg/dL (ref 6–23)
CO2: 25 mEq/L (ref 19–32)
CO2: 27 mEq/L (ref 19–32)
Calcium: 8.4 mg/dL (ref 8.4–10.5)
Calcium: 9.3 mg/dL (ref 8.4–10.5)
Chloride: 105 mEq/L (ref 96–112)
Chloride: 108 mEq/L (ref 96–112)
Creatinine, Ser: 1.04 mg/dL (ref 0.4–1.5)
Creatinine, Ser: 1.17 mg/dL (ref 0.4–1.5)
GFR calc Af Amer: >60 mL/min (ref >60)
GFR calc Af Amer: >60 mL/min (ref >60)
GFR calc non Af Amer: >60 mL/min (ref >60)
GFR calc non Af Amer: >60 mL/min (ref >60)
Glucose, Bld: 129 mg/dL — ABNORMAL HIGH (ref 70–99)
Glucose, Bld: 138 mg/dL — ABNORMAL HIGH (ref 70–99)
Potassium: 4.3 mEq/L (ref 3.5–5.1)
Potassium: 4.7 mEq/L (ref 3.5–5.1)
Sodium: 139 mEq/L (ref 135–145)
Sodium: 139 mEq/L (ref 135–145)

## 2010-10-30 LAB — TYPE AND SCREEN
ABO/RH(D): B POS
Antibody Screen: NEGATIVE

## 2010-11-02 ENCOUNTER — Encounter: Payer: Self-pay | Admitting: Internal Medicine

## 2010-11-02 ENCOUNTER — Ambulatory Visit (INDEPENDENT_AMBULATORY_CARE_PROVIDER_SITE_OTHER): Payer: Medicare Other | Admitting: Internal Medicine

## 2010-11-02 VITALS — BP 114/68 | HR 76 | Temp 98.3°F | Resp 14 | Ht 69.25 in | Wt 206.0 lb

## 2010-11-02 DIAGNOSIS — K222 Esophageal obstruction: Secondary | ICD-10-CM

## 2010-11-02 DIAGNOSIS — E785 Hyperlipidemia, unspecified: Secondary | ICD-10-CM

## 2010-11-02 DIAGNOSIS — K219 Gastro-esophageal reflux disease without esophagitis: Secondary | ICD-10-CM

## 2010-11-02 DIAGNOSIS — I10 Essential (primary) hypertension: Secondary | ICD-10-CM

## 2010-11-02 DIAGNOSIS — Z23 Encounter for immunization: Secondary | ICD-10-CM

## 2010-11-02 DIAGNOSIS — E782 Mixed hyperlipidemia: Secondary | ICD-10-CM

## 2010-11-02 DIAGNOSIS — Z0000 Encounter for general adult medical examination without abnormal findings: Secondary | ICD-10-CM

## 2010-11-02 LAB — HEPATIC FUNCTION PANEL
ALT: 23 U/L (ref 0–53)
AST: 20 U/L (ref 0–37)
Albumin: 4.3 g/dL (ref 3.5–5.2)
Alkaline Phosphatase: 62 U/L (ref 39–117)
Bilirubin, Direct: 0.2 mg/dL (ref 0.0–0.3)
Total Bilirubin: 1.3 mg/dL — ABNORMAL HIGH (ref 0.3–1.2)
Total Protein: 6.8 g/dL (ref 6.0–8.3)

## 2010-11-02 LAB — CBC WITH DIFFERENTIAL/PLATELET
Basophils Absolute: 0 10*3/uL (ref 0.0–0.1)
Basophils Relative: 0.5 % (ref 0.0–3.0)
Eosinophils Absolute: 0.2 10*3/uL (ref 0.0–0.7)
Eosinophils Relative: 3.6 % (ref 0.0–5.0)
HCT: 41.8 % (ref 39.0–52.0)
Hemoglobin: 14.5 g/dL (ref 13.0–17.0)
Lymphocytes Relative: 24.1 % (ref 12.0–46.0)
Lymphs Abs: 1.4 10*3/uL (ref 0.7–4.0)
MCHC: 34.6 g/dL (ref 30.0–36.0)
MCV: 93.5 fl (ref 78.0–100.0)
Monocytes Absolute: 0.4 10*3/uL (ref 0.1–1.0)
Monocytes Relative: 6.9 % (ref 3.0–12.0)
Neutro Abs: 3.7 10*3/uL (ref 1.4–7.7)
Neutrophils Relative %: 64.9 % (ref 43.0–77.0)
Platelets: 180 10*3/uL (ref 150.0–400.0)
RBC: 4.47 Mil/uL (ref 4.22–5.81)
RDW: 13.9 % (ref 11.5–14.6)
WBC: 5.8 10*3/uL (ref 4.5–10.5)

## 2010-11-02 LAB — LIPID PANEL
Cholesterol: 144 mg/dL (ref 0–200)
HDL: 53.4 mg/dL (ref >39.00)
LDL Cholesterol: 71 mg/dL (ref 0–99)
Total CHOL/HDL Ratio: 3
Triglycerides: 99 mg/dL (ref 0.0–149.0)
VLDL: 19.8 mg/dL (ref 0.0–40.0)

## 2010-11-02 LAB — BASIC METABOLIC PANEL
BUN: 32 mg/dL — ABNORMAL HIGH (ref 6–23)
CO2: 28 mEq/L (ref 19–32)
Calcium: 9.1 mg/dL (ref 8.4–10.5)
Chloride: 100 mEq/L (ref 96–112)
Creatinine, Ser: 1.2 mg/dL (ref 0.4–1.5)
GFR: 60.41 mL/min (ref >60.00)
Glucose, Bld: 87 mg/dL (ref 70–99)
Potassium: 4.4 mEq/L (ref 3.5–5.1)
Sodium: 138 mEq/L (ref 135–145)

## 2010-11-02 LAB — TSH: TSH: 2.44 u[IU]/mL (ref 0.35–5.50)

## 2010-11-02 MED ORDER — PNEUMOCOCCAL VAC POLYVALENT 25 MCG/0.5ML IJ INJ
0.5000 mL | INJECTION | Freq: Once | INTRAMUSCULAR | Status: AC
  Administered 2010-11-02: 0.5 mL via INTRAMUSCULAR

## 2010-11-02 NOTE — Patient Instructions (Signed)
Preventive Health Care: Exercise at least 30-45 minutes a day,  3-4 days a week.  Eat a low-fat diet with lots of fruits and vegetables, up to 7-9 servings per day. Avoid obesity; your goal is waist measurement < 40 inches.Consume less than 40 grams of sugar per day from foods & drinks with High Fructose Corn Sugar as #2,3 or # 4 on label. Eye Doctor - have an eye exam @ least annually.                                                         Depression is common in our stressful world.If you're feeling down or losing interest in things you normally enjoy, please call .  

## 2010-11-02 NOTE — Progress Notes (Signed)
Subjective:    Patient ID: Joshua Lopez, male    DOB: 08-22-1929, 77 y.o.   MRN: [id number]  HPI Medicare Wellness Visit:  The following psychosocial & medical history were reviewed as required by Medicare.   Social history: caffeine 3/4 cup, 1 glass tea , alcohol no ,  tobacco use : smoked 1 ppd 1948 , quit 1975 ; exercise walking  1 mile & machines [facility].   Home & personal  safety no issues, activities of daily living no limitations , seatbelt use yes , and smoke alarm employment yes .  Power of AttorneyLliving Will status yes  Hearing and vision evaluation hearing loss on L ;aidhad been  recommended , he declined. Orientation normal x3 , memory & recall ; 2 of 3,  math  completed poorly( 7th grade education) and mood & affect   normal  Travel history [place] 2010 , immunization status up to date , transfusion history no, and preventive health surveillance up to date, Dental care seen annually . Chart reviewed &  Updated. Active issues reviewed & addressed.        Review of Systems Patient reports no  Vision changes,anorexia, weight change, fever ,adenopathy, persistant / recurrent hoarseness, swallowing issues, chest pain,palpitations, edema,persistant / recurrent cough, hemoptysis, dyspnea(rest, exertional), gastrointestinal  bleeding (melena, rectal bleeding), abdominal pain, excessive heart burn, GU symptoms( dysuria, hematuria, pyuria, ) syncope, focal weakness, memory loss,numbness & tingling, skin/hair/nail changes,depression, anxiety, abnormal bruising/bleeding.   He has chronic musculoskeletal symptoms/signs in LS spine;he has seen  Dr Murphy/[facility]. No better with Tramadol or orl steroids.  Possible MRI pending. Occasional PNDyspnea.     Objective:   Physical Exam Gen.: Healthy and well-nourished in appearance. Alert, appropriate and cooperative throughout exam. Head: Normocephalic without obvious abnormalities; pattern   alopecia. Eyes: No corneal or conjunctival  inflammation noted. Pupils equal round reactive to light and accommodation.Arcus senilis Ears: External  ear exam reveals no significant lesions or deformities. Canals clear .TMs normal. Hearing loss as noted Nose: External nasal exam reveals no deformity or inflammation. Nasal mucosa are pink and moist. No lesions or exudates noted. Septum  Slightly dislocated to L&  deviated to R.  Mouth: Oral mucosa and oropharynx reveal no lesions or exudates. Teeth in good repair. Neck: No deformities, masses, or tenderness noted. Range of motion normal. Thyroid  w/o nodules. Lungs: Normal respiratory effort; chest expands symmetrically. Lungs are clear to auscultation without rales, wheezes, or increased work of breathing. Heart: Normal rate and rhythm. Normal S1 and S2. No gallop, click, or rub.No  Murmur. S4 Abdomen: Bowel sounds normal; abdomen soft and nontender. No masses, organomegaly . Ventral  Hernia  noted. Genitalia: as per Dr Isabel Caprice. Musculoskeletal/extremities: . No clubbing, cyanosis, edema.Mixed arthritic hand changes with flexion contracture  deformities noted. Range of motion  normal .Tone & strength  normal.Joints normal. Nail health  good. Vascular: Carotid, radial artery, dorsalis pedis and dorsalis posterior tibial pulses are full and equal.R carotid bruit  present. Neurologic: Alert and oriented x3. Deep tendon reflexes symmetrical and normal. Skin: Intact without suspicious lesions or rashes. Neg SLR  Lymph: No cervical, axillary, or inguinal lymphadenopathy present.  Psych: Mood and affect are normal. Normally interactive  Assessment & Plan:  #1 Medicare wellness visit; requirements addressed  #2 chronic low back syndrome; he is under active treatment by Dr. Sherene Sires group.  #3 hypertension, well controlled  #4 dyslipidemia, on statin therapy  Plan: Plan will be performed to assess his  present hepatorenal function and lipid levels.

## 2010-11-02 NOTE — Assessment & Plan Note (Signed)
NMR 2007: LDL 140(1604/822), HDL 55, TG 129. LDL goal = <120, ideally <  90.

## 2010-11-04 ENCOUNTER — Encounter: Payer: Self-pay | Admitting: Internal Medicine

## 2010-11-10 ENCOUNTER — Telehealth: Payer: Self-pay | Admitting: Internal Medicine

## 2010-11-10 MED ORDER — PRAVASTATIN SODIUM 20 MG PO TABS: 20.0000 mg | ORAL_TABLET | Freq: Every day | ORAL | Status: DC

## 2010-11-10 NOTE — Telephone Encounter (Signed)
Patient needs prescription for Pravastatin called into [facility], [facility] [facility] [employer], [place]---this is a new presciption for [facility]----wants 90 day prescriptions plus three refills

## 2010-11-28 ENCOUNTER — Other Ambulatory Visit: Payer: Self-pay | Admitting: Sports Medicine

## 2010-11-28 DIAGNOSIS — IMO0002 Reserved for concepts with insufficient information to code with codable children: Secondary | ICD-10-CM

## 2010-11-29 ENCOUNTER — Ambulatory Visit
Admission: RE | Admit: 2010-11-29 | Discharge: 2010-11-29 | Disposition: A | Discharge status: Home / Self Care | Payer: Medicare Other | Source: Ambulatory Visit | Attending: Sports Medicine | Admitting: Sports Medicine

## 2010-11-29 DIAGNOSIS — IMO0002 Reserved for concepts with insufficient information to code with codable children: Secondary | ICD-10-CM

## 2010-12-06 NOTE — Op Note (Signed)
Joshua Lopez, Joshua Lopez NO.:  [id number]   MEDICAL RECORD NO.:  [id number]          PATIENT TYPE:  INP   LOCATION:  5020                         FACILITY:  [facility]   PHYSICIAN:  Eulas Post, MD    DATE OF BIRTH:  [birth year]   DATE OF PROCEDURE:  02/16/2009  DATE OF DISCHARGE:                               OPERATIVE REPORT   FIRST ASSISTANT:  Skip Mayer, PA-C   PREOPERATIVE DIAGNOSIS:  Right shoulder osteoarthritis.   POSTOPERATIVE DIAGNOSIS:  Right shoulder osteoarthritis.   OPERATIVE PROCEDURE:  Right total shoulder arthroplasty.   OPERATIVE IMPLANTS:  Biomet comprehensive shoulder system primary  standard length shoulder stem, size 14 x 122 mm porous-coated Press-Fit  with a size 54 x 21 x 64 Versa-Dial humeral head and a size medium  glenoid component with 3 pegs and a central ingrowth peg using a  cemented technique for all the pegs except for the central hole.   PREOPERATIVE INDICATIONS:  Joshua Lopez is a 77 year old man, who  had end-stage osteoarthritis of the right shoulder.  He failed  conservative treatment and elected to undergo the above-named  procedures.  The risks, benefits, and alternatives were discussed  preoperatively including, but not limited to risks of infection,  bleeding, nerve injury, shoulder dislocation, tuberosity, nonunion,  stiffness, the need for revision surgery, loosening of the prosthesis or  glenoid, cardiopulmonary complications, among others, and he is willing  to proceed.   OPERATIVE FINDINGS:  There was grade 4 osteoarthritis of the glenoid as  well as of the humeral head with significant osteophyte formation and  flattening of the humeral head.   OPERATIVE PROCEDURE:  The patient was brought to the operating room and  placed in the supine position.  He had already received a regional  block.  The patient underwent general anesthesia.  The Foley was placed.  The right upper extremity was prepped and draped  in usual sterile  fashion.  Deltopectoral approach was performed.  The cephalic vein was  retracted medially.  The biceps tendon was exposed and the rotator  interval opened.  The biceps was tenodesed to the pectoralis tendon.  There was significant hourglass formation of the biceps.  We then  exposed subscapularis and used an osteotome to perform a lesser  tuberosity osteotomy.  Tack stitches were placed.   We then performed releases of the capsule and on both the glenoid and  humeral side in order to optimize freedom of motion.  The humerus was  exposed and we performed our proximal humeral head cut using the  appropriate jig.  All the osteophytes were removed.  We then exposed the  glenoid.   The labrum was excised and the glenoid was sized appropriately.  It  almost fit a large, however, I chose a medium in order to have more  leeway with the appropriate positioning of it, as well the anterior  portion of the glenoid had fairly thin bone, and some of it was  osteophyte forming the platform.  Therefore, we reamed sequentially to  the appropriate sized reamer and placed the guide  in the appropriate  version.  I took slightly more off the anterior than off the posterior  in order to achieve adequate position and appropriate bone stock.  We  then applied the template and drilled our holes placing peg  sequentially.  The guide was then removed and we drilled the hole for  the central peg.  A feeler was used in to all of the holes and there  were no breeches through the cortex.  This had excellent fit, and the  glenoid was intrinsically stable based on the bony configuration.  We  then placed cement on the back of the glenoid component everywhere  except for the central peg, which had an ingrowth component to it and  then impacted the central peg and the glenoid into place.  This provided  excellent stability and the prosthesis sat flush.   We then turned our attention to the humeral  replacement.  Drill holes  were made in the bed of the lesser tuberosity.  The FiberWire was placed  in horizontal mattress type configuration.  We also actually prior to  placing these drill holes and FiberWire, we sequentially broached and  the 14 provided the appropriate fit.  Appropriate version was achieved,  and we trialed and was found to have appropriate alignment and excellent  range of motion.  There was appropriate translation anteriorly and  posteriorly on the glenoid face.   We then impacted our real prosthesis and placed our humeral head and  then repaired subscapularis through the bone tunnels directly through  the lesser tuberosity osteotomy.  This had excellent fixation.  We  repaired the rotator interval with the arm in 20 degrees of external  rotation.  We irrigated the wounds copiously and closed the  deltopectoral fascia with Vicryl.  I did place some humeral head bone  graft into the osteotomy site in order to optimize bone healing.  We  then closed the deltopectoral interval with the cephalic vein anterior  using Ethibond.  A 3-0 Vicryl followed by subcutaneous 4-0 Monocryl were  then applied, and Steri-Strips and sterile gauze.  The patient was  awakened and returned to PACU in stable and satisfactory condition.  There were no complications, and he tolerated the procedure well.  Total  blood loss was approximately 150 mL.      Eulas Post, MD  Electronically Signed     JPL/MEDQ  D:  02/16/2009  T:  02/17/2009  Job:  [id number]

## 2010-12-06 NOTE — Discharge Summary (Signed)
Joshua Lopez, Joshua Lopez NO.:  [id number]   MEDICAL RECORD NO.:  [id number]          PATIENT TYPE:  INP   LOCATION:  5020                         FACILITY:  [facility]   PHYSICIAN:  Eulas Post, MD    DATE OF BIRTH:  1930/01/16   DATE OF ADMISSION:  02/16/2009  DATE OF DISCHARGE:  02/18/2009                               DISCHARGE SUMMARY   ADMISSION DIAGNOSIS:  Right shoulder osteoarthritis.   DISCHARGE DIAGNOSIS:  Right shoulder osteoarthritis.   PRIMARY PROCEDURE:  Right total shoulder arthroplasty performed on February 16, 2009.   Discharge medications include Phenergan and Percocet as needed.  He is  resuming his home medications.   HOSPITAL COURSE:  Joshua Lopez is a 77 year old gentleman who had  right shoulder osteoarthritis.  He elected to undergo total shoulder  arthroplasty.  He tolerated the procedure well and postoperatively did  not have any complications.  His hemoglobin and hematocrit were  monitored and remained stable.  He was given sequential compression  devices, early ambulation, and TED hose for DVT prophylaxis.  He was  working with physical therapy doing pendulums swings.  His pain was  controlled on p.o. analgesics.  He received perioperative Ancef for  antimicrobial prophylaxis.  He was doing well at the time of discharge.  It was planned to be discharged home with followup with me in 2 weeks.  There were no complications, and he benefited maximally from this  hospital stay.      Eulas Post, MD  Electronically Signed     JPL/MEDQ  D:  02/18/2009  T:  02/18/2009  Job:  [id number]

## 2010-12-06 NOTE — Assessment & Plan Note (Signed)
[facility]                         GASTROENTEROLOGY OFFICE NOTE   Joshua, Lopez                       MRN:          [id number]  DATE:04/16/2007                            DOB:          06-12-1930    Joshua Lopez is a 77 year old white male patient of Dr. Richarda Osmond who  has peripheral vascular disease with previous multiple surgical  operations by Dr. Jerilee Field. He had been followed by Dr. Corinda Gubler for  recurrent colon polyps, chronic acid reflux disease, and last had a  peptic stricture of his esophagus with dilation in 2003. He is up to  date on his colonoscopy. He otherwise has been in good health except for  essential hypertension, BPH, and hyperlipidemia. He currently is on:  1. Benazepril 5 mg a day.  2. Norvasc 5 mg a day.  3. Saw palmetto daily.  4. Variety of supplements.   HE IN THE PAST HAS HAD REACTIONS TO SALICYLATES.   He comes to the office today complaining of recurrent reflux symptoms  with progressive solid food dysphagia. He was supposed to be chronic  Protonix but stopped this medication several months ago because of  concerns about hip thinning. He has been using p.r.n. Tums but  continues to have intermittent dysphagia from bread and meats. Recent  laboratory data per Dr. Alwyn Ren showed a normal CBC, and metabolic  profile, and hemoglobin A1C. The patient denies lower GI problems or  hepatobiliary complaints.   He is awake and alert and in no acute distress. He is 5 feet 9 inches  and weighs 206 pounds. Blood pressure is 140/70 and pulse was 64 and  regular.  I could not appreciate stigmata of chronic liver disease.  CHEST: Clear and he was in a regular rhythm without murmurs, gallops, or  rubs.  ABDOMEN: Showed no organomegaly, masses, or tenderness.  Peripheral extremities were unremarkable.  </Assessment?>  Joshua Lopez has recurrent acid reflux and peptic stricture of esophagus  which will require endoscopy and  dilation.   RECOMMENDATIONS:  1. I have restarted Protonix 40 mg 30 minutes before supper with      standard antireflux maneuvers.  2. Outpatient endoscopy and dilation at his convenience.  3. Continue other medications as per Dr. Alwyn Ren.     Vania Rea. Jarold Motto, MD, Caleen Essex, FAGA  Electronically Signed   DRP/MedQ  DD: 04/16/2007  DT: 04/16/2007  Job #: [id number]

## 2010-12-09 NOTE — Op Note (Signed)
NAMEOSAMAH, SCHMADER NO.:  [id number]   MEDICAL RECORD NO.:  [id number]          PATIENT TYPE:  AMB   LOCATION:  DSC                          FACILITY:  [facility]   PHYSICIAN:  Cindee Salt, M.D.       DATE OF BIRTH:  06-Feb-1930   DATE OF PROCEDURE:  01/02/2006  DATE OF DISCHARGE:                                 OPERATIVE REPORT   PREOPERATIVE DIAGNOSIS:  Status post puncture wound with questionable  foreign body right thumb.   POSTOPERATIVE DIAGNOSIS:  Status post puncture wound with questionable  foreign body right thumb.   OPERATION:  Exploration, debridement of right thumb.   SURGEON:  Cindee Salt, M.D.   ASSISTANT:  Carolyne Fiscal R.N.   ANESTHESIA:  Metacarpal block.   HISTORY:  The patient is a 77 year old male with a 3 weeks' history of  swelling, drainage of his thumb.  He suffered a puncture wound; was felt to  have a piece of wood which had penetrated, and he was unable to remove this.  It has been painful for him, he desires exploration and possible removal.  He has attempted to remove this on several occasions; and has been  unsuccessful.   DESCRIPTION OF PROCEDURE:  The patient is brought to the operating room  where a metacarpal block was given with 1% Xylocaine without epinephrine;  and he was prepped using Betadine scrub and solution right arm free.  A  Penrose drain was used for tourniquet control at the base of the finger,  after adequate anesthesia was afforded.  An incision was made over the  entrance wound in the mid lateral line on the ulnar aspect of the thumb,  distal phalanx, carried down through subcutaneous tissue.  An area of  altered fatty tissue and what appeared to be a small granuloma was  immediately encountered on the puncture area.  No further lesions were  identified.  With blunt sharp dissection this was dissected free and sent to  pathology.  The remainder of the pulp was explored; and no further foreign  material was noted.  The  wound was irrigated and closed with interrupted 5-0  nylon sutures.  A sterile compressive dressing was applied.  The patient  tolerated the procedure well; and was discharged home to return to the [facility] [place] in 1 week on Vicodin.           ______________________________  Cindee Salt, M.D.     GK/MEDQ  D:  01/02/2006  T:  01/02/2006  Job:  [id number]

## 2010-12-09 NOTE — Op Note (Signed)
[place]  Patient:    Joshua Lopez, Joshua Lopez Visit Number: [id number] MRN: [id number]          Service Type: DSU Location: [facility] Attending Physician:  Ronne Binning Dictated by:   Nicki Reaper, M.D. Proc. Date: 08/22/01 Admit Date:  08/22/2001                             Operative Report  PREOPERATIVE DIAGNOSIS: Volar wrist ganglion, left wrist.  POSTOPERATIVE DIAGNOSIS: Volar wrist ganglion, left wrist.  OPERATION/PROCEDURE: Excision of volar wrist ganglion, left wrist.  SURGEON: Nicki Reaper, M.D.  ASSISTANT: Joaquin Courts, R.N.  ANESTHESIA: IV regional.  ANESTHESIOLOGIST: Halford Decamp, M.D.  INDICATIONS FOR PROCEDURE: The patient is a 77 year old male, with a history of a large mass on the volar radial aspect of his left wrist.  He desires removal of this.  DESCRIPTION OF PROCEDURE: The patient was brought to the operating room, where an upper arm IV regional anesthetic was carried out without difficulty.  He was prepped and draped using Betadine scrubbing solution with the left arm free.  A carpal lunate incision was made over the mass, carried down through subcutaneous tissue.  Bleeders were electrocauterized.  The radial artery was identified and protected.  The dissection was carried down to the cyst.  A large multiloculated cystic structure was identified and with blunt and sharp dissection this was dissected free.  It was found to come from the volar radial carpal joint between the deep and superficial branches of the radial artery.  Both were protected.  The entire mass was excised and sent.  The area of the wrist capsule was debrided with a rongeur.  The wound was copiously irrigated with saline.  The subcutaneous tissue was closed with interrupted 4-0 Vicryl and the skin with interrupted 5-0 nylon suture.  A sterile compressive dressing and splint were applied.  The patient tolerated the procedure well and was taken to  the recovery room for observation in satisfactory condition.  He is discharged home to return to the [facility] [place] in one week, on Vicodin and Keflex. Dictated by:   Nicki Reaper, M.D. Attending Physician:  Ronne Binning DD:  08/22/01 TD:  08/22/01 Job: [id number]

## 2010-12-09 NOTE — Op Note (Signed)
[place]. [facility]  Patient:    Joshua Lopez, Joshua Lopez                       MRN: [id number] Proc. Date: 01/31/00 Adm. Date:  [id number] Attending:  Twana First                           Operative Report  PREOPERATIVE DIAGNOSIS:  Left knee medial meniscus tear.  POSTOPERATIVE DIAGNOSES: 1. Left knee medial and lateral meniscal tears. 2. 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  PROCEDURE: 1. Left knee EUA followed by arthroscopic partial medial and lateral    meniscectomies. 2. Left knee medial compartment and patellofemoral chondroplasty.  SURGEON:  Elana Alm. Thurston Hole, M.D.  ASSISTANT:  None.  ANESTHESIA:  General.  OPERATIVE TIME:  Forty minutes.  COMPLICATIONS:  None.  INDICATIONS FOR PROCEDURE:  The patient is a 77 year old gentleman who has had four to five months of increasing left knee pain with signs and symptoms consistent with medial meniscus tear, early DJD that has failed conservative care, and is now to undergo arthroscopy.  DESCRIPTION OF PROCEDURE:  The patient was brought to the operating room on January 31, 2000, and placed on the operating table in the supine position. After an adequate level of general anesthesia was obtained, the left knee was examined under anesthesia.  Range of motion from 0 to 130 degrees, 1+ crepitation, and mild varus deformity.  Knee stable to ligamentous exam.  The knee was sterilely injected with 0.25% Marcaine with epinephrine.  The leg was prepped using sterile Betadine and draped using sterile technique.  Initially, through an inferolateral portal, the arthroscope with a pump attached was placed, and through an inferomedial portal, an arthroscopic probe was placed.  On initial inspection of the medial compartment, he was found to have 50% grade 3 chondromalacia of the medial femoral condyle which was thoroughly debrided.  Medial tibial plateau showed diffuse grade 2  changes. He had complex tearing of the posterior medial horn of the medial meniscus of which 50% was resected back to a stable rim.  The intercondylar notch was inspected.  The anterior and posterior cruciate ligaments were found to be normal.  Lateral compartment inspected.  Articular cartilage, lateral femoral condyle, and lateral tibial plateau showed only mild grade 1 and 2 changes. Lateral meniscus tear of posterior and lateral horn.  Twenty-five percent of the inner surface was resected back to a stable rim.  Patellofemoral joint showed grade 3 chondromalacia on the patella which was debrided.  Grade 1 and 2 changes on the femur.  Patella tracked normally.  Mild synovitis in the medial and lateral gutters were debrided, otherwise they were free of pathology.  After this was done, it was felt that all pathology had been satisfactorily addressed.  The instruments were removed.  Portals were closed with 3-0 nylon suture and injected with 0.25% Marcaine with epinephrine and 5 mg of morphine.  Sterile dressings applied and the patient awakened and taken to the recovery room in stable condition.  FOLLOWUP CARE:  The patient will be followed as an outpatient on Vicodin and Naprosyn.  I will see him back in the office in a week for sutures out and follow up. DD:  01/31/00 TD:  02/01/00 Job: [id number]

## 2010-12-20 ENCOUNTER — Other Ambulatory Visit: Payer: Self-pay | Admitting: Internal Medicine

## 2010-12-20 MED ORDER — AMLODIPINE BESYLATE 5 MG PO TABS: 5.0000 mg | ORAL_TABLET | Freq: Every day | ORAL | Status: DC

## 2010-12-20 NOTE — Telephone Encounter (Signed)
RX sent to pharmacy  

## 2011-01-04 ENCOUNTER — Other Ambulatory Visit: Payer: Self-pay | Admitting: Sports Medicine

## 2011-01-04 DIAGNOSIS — IMO0002 Reserved for concepts with insufficient information to code with codable children: Secondary | ICD-10-CM

## 2011-01-05 ENCOUNTER — Other Ambulatory Visit: Payer: Self-pay | Admitting: Sports Medicine

## 2011-01-05 ENCOUNTER — Ambulatory Visit
Admission: RE | Admit: 2011-01-05 | Discharge: 2011-01-05 | Disposition: A | Discharge status: Home / Self Care | Payer: Medicare Other | Source: Ambulatory Visit | Attending: Sports Medicine | Admitting: Sports Medicine

## 2011-01-05 DIAGNOSIS — IMO0002 Reserved for concepts with insufficient information to code with codable children: Secondary | ICD-10-CM

## 2011-01-21 ENCOUNTER — Other Ambulatory Visit: Payer: Self-pay | Admitting: Internal Medicine

## 2011-03-01 ENCOUNTER — Other Ambulatory Visit: Payer: Self-pay | Admitting: Internal Medicine

## 2011-03-02 MED ORDER — BENAZEPRIL-HYDROCHLOROTHIAZIDE 20-25 MG PO TABS: 1.0000 | ORAL_TABLET | Freq: Two times a day (BID) | ORAL | Status: DC

## 2011-03-02 NOTE — Telephone Encounter (Signed)
Patient called pharmacy to check status and pharmacy told patient that doctors office had said to "discontinue" this medication--patient is confused and asks that we call pharmacy to straighten it out

## 2011-03-02 NOTE — Telephone Encounter (Signed)
Left message on voicemail for pharmacy to change rx from #60 to #180 with 1 refill

## 2011-07-21 ENCOUNTER — Encounter: Payer: Self-pay | Admitting: Internal Medicine

## 2011-07-21 ENCOUNTER — Ambulatory Visit (INDEPENDENT_AMBULATORY_CARE_PROVIDER_SITE_OTHER): Payer: Medicare Other | Admitting: Internal Medicine

## 2011-07-21 DIAGNOSIS — R05 Cough: Secondary | ICD-10-CM

## 2011-07-21 DIAGNOSIS — R059 Cough, unspecified: Secondary | ICD-10-CM

## 2011-07-21 DIAGNOSIS — Z23 Encounter for immunization: Secondary | ICD-10-CM

## 2011-07-21 DIAGNOSIS — I10 Essential (primary) hypertension: Secondary | ICD-10-CM

## 2011-07-21 MED ORDER — LOSARTAN POTASSIUM-HCTZ 100-12.5 MG PO TABS: 1.0000 | ORAL_TABLET | Freq: Every day | ORAL | Status: DC

## 2011-07-21 MED ORDER — MONTELUKAST SODIUM 10 MG PO TABS: 10.0000 mg | ORAL_TABLET | Freq: Every day | ORAL | Status: DC

## 2011-07-21 NOTE — Progress Notes (Signed)
Subjective:    Patient ID: Joshua Lopez, male    DOB: Mar 10, 1930, 77 y.o.   MRN: [id number]  HPI Respiratory tract infection Onset/symptoms:3 months ago as dry cough Progression of symptoms:to cough with yellow sputum  Treatments/response:Amox,Z pack, Prednisone, inhaler, cough syrup from [facility]/ "90 % better". CXray better. Cough better with candy Present symptoms: Fever/chills/sweats:no Frontal headache:no Facial pain:no Nasal purulence:no Sore throat:no Lymphadenopathy:no Wheezing/shortness of breath:no Cough/sputum/hemoptysis:sputum now clear & scant Associated extrinsic/allergic symptoms:itchy eyes/ sneezing:some Past medical history: Seasonal allergies; yes/asthma:no Smoking history:quit 1975           Review of Systems     Objective:   Physical Exam General appearance is of good health and nourishment; no acute distress or increased work of breathing is present.  No  lymphadenopathy about the head, neck, or axilla noted.   Eyes: No conjunctival inflammation or lid edema is present.  Ears:  External ear exam shows no significant lesions or deformities.  Otoscopic examination reveals clear canals, tympanic membranes are intact bilaterally without bulging, retraction, inflammation or discharge.  Nose:  External nasal examination shows no deformity or inflammation. Nasal mucosa are dry without lesions or exudates. No septal dislocation .No obstruction to airflow.   Oral exam: Dental hygiene is good; lips and gums are healthy appearing.There is no oropharyngeal erythema or exudate noted.   Heart:  Normal rate and regular rhythm. S1 and S2 normal without gallop, murmur, click, rub or other extra sounds.   Lungs:Chest clear to auscultation; no wheezes, rhonchi,rales ,or rubs present.No increased work of breathing. Dry cough  Extremities:  No cyanosis, edema, or clubbing  noted    Skin: Warm & dry .          Assessment & Plan:   #1 cough, protracted  period history suggests that initially it may have been viral or bacterial; additionally history suggests possible extrinsic component. The most likely cause of the persistent cough at this time would be reaction to his ACE inhibitor.  Clinically there is no evidence of reactive airways disease at this time.  He'll be placed on Singulair 10 mg daily and switched to an ARB in place with ACE inhibitor. The pathophysiology of ACE-induced cough was discussed.

## 2011-07-21 NOTE — Progress Notes (Signed)
Addended by: Regis Bill on: 07/21/2011 12:56 PM   Modules accepted: Orders

## 2011-07-21 NOTE — Patient Instructions (Signed)
Blood Pressure Goal  Ideally is an AVERAGE < 135/85. This AVERAGE should be calculated from @ least 5-7 BP readings taken @ different times of day on different days of week. You should not respond to isolated BP readings , but rather the AVERAGE for that week .Stop Benazepril/HCT.

## 2011-09-10 ENCOUNTER — Encounter (HOSPITAL_COMMUNITY): Payer: Self-pay | Admitting: *Deleted

## 2011-09-10 ENCOUNTER — Other Ambulatory Visit: Payer: Self-pay

## 2011-09-10 ENCOUNTER — Emergency Department (HOSPITAL_COMMUNITY): Payer: Medicare Other

## 2011-09-10 ENCOUNTER — Emergency Department (HOSPITAL_COMMUNITY)
Admission: EM | Admit: 2011-09-10 | Discharge: 2011-09-10 | Disposition: A | Discharge status: Home / Self Care | Payer: Medicare Other | Attending: Emergency Medicine | Admitting: Emergency Medicine

## 2011-09-10 DIAGNOSIS — E785 Hyperlipidemia, unspecified: Secondary | ICD-10-CM | POA: Diagnosis not present

## 2011-09-10 DIAGNOSIS — I10 Essential (primary) hypertension: Secondary | ICD-10-CM | POA: Insufficient documentation

## 2011-09-10 DIAGNOSIS — R42 Dizziness and giddiness: Secondary | ICD-10-CM | POA: Diagnosis not present

## 2011-09-10 DIAGNOSIS — N400 Enlarged prostate without lower urinary tract symptoms: Secondary | ICD-10-CM | POA: Diagnosis not present

## 2011-09-10 DIAGNOSIS — IMO0002 Reserved for concepts with insufficient information to code with codable children: Secondary | ICD-10-CM | POA: Insufficient documentation

## 2011-09-10 DIAGNOSIS — R55 Syncope and collapse: Secondary | ICD-10-CM | POA: Diagnosis not present

## 2011-09-10 DIAGNOSIS — Z79899 Other long term (current) drug therapy: Secondary | ICD-10-CM | POA: Insufficient documentation

## 2011-09-10 DIAGNOSIS — R404 Transient alteration of awareness: Secondary | ICD-10-CM | POA: Diagnosis not present

## 2011-09-10 LAB — CBC
HCT: 38.2 % — ABNORMAL LOW (ref 39.0–52.0)
Hemoglobin: 13.2 g/dL (ref 13.0–17.0)
MCH: 30.9 pg (ref 26.0–34.0)
MCHC: 34.6 g/dL (ref 30.0–36.0)
MCV: 89.5 fL (ref 78.0–100.0)
Platelets: 196 10*3/uL (ref 150–400)
RBC: 4.27 MIL/uL (ref 4.22–5.81)
RDW: 13.4 % (ref 11.5–15.5)
WBC: 6.8 10*3/uL (ref 4.0–10.5)

## 2011-09-10 LAB — CARDIAC PANEL(CRET KIN+CKTOT+MB+TROPI)
CK, MB: 2.1 ng/mL (ref 0.3–4.0)
Relative Index: INVALID (ref 0.0–2.5)
Total CK: 60 U/L (ref 7–232)
Troponin I: <0.3 ng/mL (ref <0.30)

## 2011-09-10 LAB — DIFFERENTIAL
Basophils Absolute: 0 10*3/uL (ref 0.0–0.1)
Basophils Relative: 0 % (ref 0–1)
Eosinophils Absolute: 0.2 10*3/uL (ref 0.0–0.7)
Eosinophils Relative: 2 % (ref 0–5)
Lymphocytes Relative: 17 % (ref 12–46)
Lymphs Abs: 1.2 10*3/uL (ref 0.7–4.0)
Monocytes Absolute: 0.4 10*3/uL (ref 0.1–1.0)
Monocytes Relative: 5 % (ref 3–12)
Neutro Abs: 5.1 10*3/uL (ref 1.7–7.7)
Neutrophils Relative %: 75 % (ref 43–77)

## 2011-09-10 LAB — BASIC METABOLIC PANEL
BUN: 19 mg/dL (ref 6–23)
CO2: 26 mEq/L (ref 19–32)
Calcium: 9.4 mg/dL (ref 8.4–10.5)
Chloride: 105 mEq/L (ref 96–112)
Creatinine, Ser: 1.06 mg/dL (ref 0.50–1.35)
GFR calc Af Amer: 75 mL/min — ABNORMAL LOW (ref >90)
GFR calc non Af Amer: 65 mL/min — ABNORMAL LOW (ref >90)
Glucose, Bld: 104 mg/dL — ABNORMAL HIGH (ref 70–99)
Potassium: 4.1 mEq/L (ref 3.5–5.1)
Sodium: 140 mEq/L (ref 135–145)

## 2011-09-10 LAB — GLUCOSE, CAPILLARY: Glucose-Capillary: 159 mg/dL — ABNORMAL HIGH (ref 70–99)

## 2011-09-10 LAB — PROTIME-INR
INR: 0.99 (ref 0.00–1.49)
Prothrombin Time: 13.3 seconds (ref 11.6–15.2)

## 2011-09-10 LAB — APTT: aPTT: 31 seconds (ref 24–37)

## 2011-09-10 NOTE — ED Notes (Signed)
Patient remains on monitor and resting with family at bedside.

## 2011-09-10 NOTE — ED Notes (Signed)
Patient resting and remains on monitor with NAD at this time and family at bedside.

## 2011-09-10 NOTE — ED Notes (Signed)
Pt placed back on monitor,continuous pulse oximetry and blood pressure cuff; pt stated "I don't think I need the oxygen any more. I'm breathing ok"; family at bedside

## 2011-09-10 NOTE — ED Notes (Signed)
Per EMS- patient was sitting in Sunday school and passed out. On arrival patient was A&O. BP148/78 with HR in normal range. CBG 132. Patient allergic to ASA.

## 2011-09-10 NOTE — ED Notes (Addendum)
Patient state he was in church this morning and started to feel dizzy and passed out during the service. Patient denies chest pain. Patient states left arm tingling. Patient placed on monitor and oxygen 2L with sats of 93%. Family at bedside. EKG captured on arrival.

## 2011-09-10 NOTE — ED Notes (Signed)
Readjusted stretcher for pt to more of a lying position

## 2011-09-10 NOTE — ED Notes (Signed)
RN taking lunch break. Morrie Sheldon, RN to care for patient.

## 2011-09-10 NOTE — ED Notes (Signed)
Pt undressed, in gown, on monitor, continuous pulse oximetry, blood pressure cuff and oxygen [place] (2L) with assistance from [place], EMT; EKG performed

## 2011-09-10 NOTE — ED Notes (Signed)
Patient resting and remains on monitor with NAD and family at bedside.

## 2011-09-10 NOTE — ED Provider Notes (Addendum)
History     CSN: [id number]  Arrival date & time 09/10/11  1103   First MD Initiated Contact with Patient 09/10/11 1105      Chief Complaint  Patient presents with  . Loss of Consciousness    (Consider location/radiation/quality/duration/timing/severity/associated sxs/prior treatment) HPI Comments: The patient is a 78 year old male with past medical history significant for hypertension, but no history of coronary artery disease, congestive heart failure, or stroke. The patient reports that he was sitting in a chair at church, in a very warm room, and he began to feel very warm, flushed, lightheaded and then reportedly had a syncopal episode, without falling out of the chair, during which he was unresponsive for several seconds before regaining consciousness. He denies any chest pain, palpitations, shortness of breath, nausea, or vomiting before or after the syncopal episode. He had no confusion, and was back to his baseline mental status immediately on resuming consciousness. On arrival, the patient is awake, alert, and oriented to person, place, time, and event, and in no apparent distress, reporting no symptoms and stating that he feels "fine".  Patient is a 78 y.o. male presenting with syncope. The history is provided by the patient, the spouse and the EMS personnel.  Loss of Consciousness This is a new problem. The current episode started less than 1 hour ago. The problem has been resolved. Pertinent negatives include no chest pain, no abdominal pain, no headaches and no shortness of breath.    Past Medical History  Diagnosis Date  . Esophageal stricture   . Hypertension   . BPH (benign prostatic hypertrophy)     Dr. Isabel Caprice monitors  elevated PSA  . Colon polyp     2006 and 2010  . Hyperlipidemia     NMR  2007  . Subclavian steal syndrome 1997    Past Surgical History  Procedure Date  . Subclavian stent placement 1997    right side  . Prostate biopsy 2000    Dr. Earlene Plater  .  Esophageal dilation 2003  . Colonoscopy w/ polypectomy     2006 and 2010; HYPERPLASTIC polyp and  . Ganglion cyst excision 1999    LUE  . Total shoulder arthroplasty 2010    Dr. Dion Saucier  . Knee arthroscopy 2001    Family History  Problem Relation Age of Onset  . Sudden death Father     murdered  . Hypertension Mother   . Skin cancer Mother   . Skin cancer Brother   . Hypertension Brother   . Coronary artery disease Brother     CABG  . Colon cancer Paternal Uncle   . Breast cancer Paternal Grandmother   . Breast cancer Paternal Aunt   . Colon cancer Paternal Aunt   . Heart attack Paternal Uncle     History  Substance Use Topics  . Smoking status: Never Smoker   . Smokeless tobacco: Not on file  . Alcohol Use: No      Review of Systems  Constitutional: Negative for fever, chills, diaphoresis, activity change, appetite change and fatigue.  HENT: Negative for ear pain, congestion, sore throat, rhinorrhea, neck pain, postnasal drip and sinus pressure.   Eyes: Negative for visual disturbance.  Respiratory: Negative for cough, chest tightness, shortness of breath and wheezing.   Cardiovascular: Positive for syncope. Negative for chest pain, palpitations and leg swelling.  Gastrointestinal: Negative for nausea, vomiting, abdominal pain and diarrhea.  Genitourinary: Negative.   Musculoskeletal: Negative for myalgias and back pain.  Skin: Negative  for color change, pallor and rash.  Neurological: Positive for syncope and light-headedness. Negative for dizziness, tremors, weakness, numbness and headaches.  Hematological: Does not bruise/bleed easily.  Psychiatric/Behavioral: Negative for confusion.    Allergies  Aspirin  Home Medications   Current Outpatient Rx  Name Route Sig Dispense Refill  . TYLENOL EXTRA STRENGTH PO Oral Take 500 mg by mouth 4 (four) times daily as needed. 2 po qid prn      . AMLODIPINE BESYLATE 5 MG PO TABS Oral Take 1 tablet (5 mg total) by  mouth daily. 90 tablet 2  . BENAZEPRIL-HYDROCHLOROTHIAZIDE 20-25 MG PO TABS      . CALCIUM CARBONATE-VITAMIN D 600-400 MG-UNIT PO TABS Oral Take 1 tablet by mouth 2 (two) times daily.      Marland Kitchen CLARITHROMYCIN 500 MG PO TABS      . VITAMIN B-12 CR PO Oral Take by mouth.      . OMEGA-3 FATTY ACIDS 1000 MG PO CAPS Oral Take 2 g by mouth daily. 3 tabs daily      . LOSARTAN POTASSIUM-HCTZ 100-12.5 MG PO TABS Oral Take 1 tablet by mouth daily. 30 tablet 5  . MONTELUKAST SODIUM 10 MG PO TABS Oral Take 1 tablet (10 mg total) by mouth at bedtime. 30 tablet 3  . PANTOPRAZOLE SODIUM 40 MG PO TBEC  TAKE ONE TABLET BY MOUTH EVERY DAY 30 MINUTES BEFORE MEAL 90 tablet 2  . PRAVASTATIN SODIUM 20 MG PO TABS Oral Take 1 tablet (20 mg total) by mouth daily. 90 tablet 3  . PREDNISONE 20 MG PO TABS      . PROAIR HFA 108 (90 BASE) MCG/ACT IN AERS      . SAW PALMETTO (SERENOA REPENS) PO Oral Take by mouth.      Marland Kitchen SILODOSIN 4 MG PO CAPS Oral Take 4 mg by mouth at bedtime.     . TAMSULOSIN HCL 0.4 MG PO CAPS      . TRAMADOL HCL 50 MG PO TABS Oral Take 50 mg by mouth every 6 (six) hours as needed. 1-2 po q 6-8 hrs prn arthritis       BP 127/82  Pulse 76  Temp(Src) 97.7 F (36.5 C) (Oral)  Resp 30  SpO2 95%  Physical Exam  Nursing note and vitals reviewed. Constitutional: He is oriented to person, place, and time. He appears well-developed and well-nourished. No distress.  HENT:  Head: Normocephalic and atraumatic.  Mouth/Throat: Oropharynx is clear and moist.  Eyes: EOM are normal. Pupils are equal, round, and reactive to light.  Neck: Normal range of motion. Neck supple. No JVD present. No tracheal deviation present.  Cardiovascular: Normal rate, regular rhythm, S1 normal, S2 normal, normal heart sounds and intact distal pulses.   No extrasystoles are present. PMI is not displaced.  Exam reveals no gallop and no friction rub.   No murmur heard. Pulmonary/Chest: Effort normal and breath sounds normal. No  accessory muscle usage or stridor. Not tachypneic. No respiratory distress. He has no decreased breath sounds. He has no wheezes. He has no rhonchi. He has no rales. He exhibits no tenderness, no bony tenderness, no crepitus and no retraction.  Abdominal: Soft. Bowel sounds are normal. He exhibits no distension and no mass. There is no tenderness. There is no rebound and no guarding.  Musculoskeletal: Normal range of motion. He exhibits no edema and no tenderness.  Neurological: He is alert and oriented to person, place, and time. No cranial nerve deficit. He exhibits  normal muscle tone.  Skin: Skin is warm and dry. No rash noted. He is not diaphoretic. No erythema. No pallor.  Psychiatric: He has a normal mood and affect. His behavior is normal. Judgment and thought content normal.    ED Course  Procedures (including critical care time)  Date: 09/10/2011  Rate: 80  Rhythm: normal sinus rhythm  QRS Axis: normal  Intervals: normal  ST/T Wave abnormalities: normal  Conduction Disutrbances:none  Narrative Interpretation: Non-provocative EKG  Old EKG Reviewed: none available   Labs Reviewed  GLUCOSE, CAPILLARY - Abnormal; Notable for the following:    Glucose-Capillary 159 (*)    All other components within normal limits   No results found.   No diagnosis found.    MDM  Anemia, arrhythmia, electrolyte abnormality, vasovagal syncope are all considered amongst other potential etiologies of the patient's syncopal event. Myocardial infarction is not thought to be likely as the patient has had no chest pain and has no history of coronary artery disease, with a normal ECG. Vasovagal syncope from peripheral capillary dye location and subsequent drop in blood pressure is thought to be the most likely etiology.  Felisa Bonier, MD 09/10/11 1142  3:50 PM The patient has had an uneventful course on telemetry monitoring while in the ED and all of his diagnostic tests are normal, and the  patient is asymptomatic. At this time I perceive the cause of his syncope to have been vasovagal syncope, likely due to peripheral vasodilation in a warm room and subsequent brief drop in blood pressure. Patient's blood pressure is stable and normal at this time. I directed him to followup this week with his primary care physician for reevaluation, and to return immediately by ambulance to the emergency department should he have another syncopal episode. Patient states his understanding of and agreement with the plan of care.  Felisa Bonier, MD 09/10/11 [phone]

## 2011-09-11 DIAGNOSIS — M25559 Pain in unspecified hip: Secondary | ICD-10-CM | POA: Diagnosis not present

## 2011-09-11 DIAGNOSIS — M659 Synovitis and tenosynovitis, unspecified: Secondary | ICD-10-CM | POA: Diagnosis not present

## 2011-09-13 ENCOUNTER — Encounter: Payer: Self-pay | Admitting: Internal Medicine

## 2011-09-13 ENCOUNTER — Ambulatory Visit (INDEPENDENT_AMBULATORY_CARE_PROVIDER_SITE_OTHER): Payer: Medicare Other | Admitting: Internal Medicine

## 2011-09-13 DIAGNOSIS — I10 Essential (primary) hypertension: Secondary | ICD-10-CM | POA: Diagnosis not present

## 2011-09-13 DIAGNOSIS — D649 Anemia, unspecified: Secondary | ICD-10-CM | POA: Diagnosis not present

## 2011-09-13 DIAGNOSIS — M5137 Other intervertebral disc degeneration, lumbosacral region: Secondary | ICD-10-CM | POA: Diagnosis not present

## 2011-09-13 DIAGNOSIS — M545 Low back pain, unspecified: Secondary | ICD-10-CM | POA: Diagnosis not present

## 2011-09-13 DIAGNOSIS — R0989 Other specified symptoms and signs involving the circulatory and respiratory systems: Secondary | ICD-10-CM

## 2011-09-13 DIAGNOSIS — R7989 Other specified abnormal findings of blood chemistry: Secondary | ICD-10-CM

## 2011-09-13 DIAGNOSIS — R55 Syncope and collapse: Secondary | ICD-10-CM

## 2011-09-13 DIAGNOSIS — M5430 Sciatica, unspecified side: Secondary | ICD-10-CM | POA: Diagnosis not present

## 2011-09-13 NOTE — Patient Instructions (Signed)
Repeat the isometric exercises discussed 4- 5 times prior to standing if you've been seated for a period of time.  

## 2011-09-13 NOTE — Progress Notes (Signed)
Subjective:    Patient ID: Joshua Lopez, male    DOB: 07-29-1929, 78 y.o.   MRN: [id number]  HPI He experienced frank syncope on 09/10/11; emergency room notes were reviewed. Blood pressure was low was 99/88 and respiratory rate as high as 27. Glucose was 104 & 159. GFR was 65. He exhibited a mild anemia with hematocrit 38.2. Chemistries and cardiac enzymes were otherwise normal. He's had no recurrence of syncope since.There is some question that this was related to vasodilation in the context of him getting overheated in a warm Sunday school class. Also prior to the event he had not been taking his generic Protonix but had mistakenly been taking pravastatin in its place. He states he was only taking pravastatin once a day.  Has a past history of syncope while visiting someone in the hospital and being exposed to ether fumes.  He had stenting of the right subclavian artery in 1997.    Review of Systems Syncopedescrition  :   Onset: within 5-10 min of sitting down   .  Context: not related to position change;coughing ; straining; pain.   LOC/ Duration : 1 min  .   Cardiac Prodrome: heart racing/ heart irregularity/ palpitations : no  .   Neuro Prodrome:headache:no; numbness & tingling : no  ; weakness : no.   Vertigo: no;gait dysfunction/falling:no  ; tremor: no.   ROS:no  diaphoresis  ; chest pain ;  hemoptysis  ;   dyspnea.   Seizure activity: limb movement : no as per wife ; no  urine / stool incontinence ; no body injury; he simply slumped forward      He is on physical therapy for chronic low back pain                                                                                                                                                 Objective:   Physical Exam  Gen. appearance: Well-nourished, in no distress Eyes: Extraocular motion intact, field of vision normal, vision grossly decreased, no nystagmus. Arcus senilis ENT: Canals clear, tympanic membranes normal, hearing  grossly decreased on L to whisper Neck: Normal range of motion, no masses, normal thyroid Cardiovascular: Rate and rhythm normal; no murmurs, gallops or extra heart sounds Sounds distant Muscle skeletal: Dupuytren's bilaterally; tone, &  strength normal Neuro:no cranial nerve deficit except slight asymmetric nasolabial folds, deep tendon  reflexes normal, gait normal. Romberg testing and finger to nose testing normal  Vascular:  right carotid bruit; no definite subclavian bruit. All pulses intact Lymph: No cervical or axillary LA Skin: Warm and dry without suspicious lesions or rashes Psych: no anxiety or mood change. Normally interactive and cooperative.         Assessment & Plan:  #1 syncope; in the context of sitting in a warm room  #2 hypertension,  well controlled  #3 right carotid bruit in the context of her past medical history of right subclavian steal syndrome  #4 anemia, mild  #5 hyperglycemia, nonfasting  Plan: See orders and recommendations

## 2011-09-14 ENCOUNTER — Other Ambulatory Visit: Payer: Self-pay | Admitting: Internal Medicine

## 2011-09-14 ENCOUNTER — Other Ambulatory Visit: Payer: Self-pay | Admitting: *Deleted

## 2011-09-14 DIAGNOSIS — R0989 Other specified symptoms and signs involving the circulatory and respiratory systems: Secondary | ICD-10-CM

## 2011-09-14 DIAGNOSIS — G458 Other transient cerebral ischemic attacks and related syndromes: Secondary | ICD-10-CM

## 2011-09-14 DIAGNOSIS — R55 Syncope and collapse: Secondary | ICD-10-CM

## 2011-09-14 DIAGNOSIS — M545 Low back pain, unspecified: Secondary | ICD-10-CM | POA: Diagnosis not present

## 2011-09-14 DIAGNOSIS — M5430 Sciatica, unspecified side: Secondary | ICD-10-CM | POA: Diagnosis not present

## 2011-09-14 DIAGNOSIS — M5137 Other intervertebral disc degeneration, lumbosacral region: Secondary | ICD-10-CM | POA: Diagnosis not present

## 2011-09-19 DIAGNOSIS — M545 Low back pain, unspecified: Secondary | ICD-10-CM | POA: Diagnosis not present

## 2011-09-19 DIAGNOSIS — M5137 Other intervertebral disc degeneration, lumbosacral region: Secondary | ICD-10-CM | POA: Diagnosis not present

## 2011-09-19 DIAGNOSIS — M5430 Sciatica, unspecified side: Secondary | ICD-10-CM | POA: Diagnosis not present

## 2011-09-21 DIAGNOSIS — M5137 Other intervertebral disc degeneration, lumbosacral region: Secondary | ICD-10-CM | POA: Diagnosis not present

## 2011-09-21 DIAGNOSIS — M545 Low back pain, unspecified: Secondary | ICD-10-CM | POA: Diagnosis not present

## 2011-09-21 DIAGNOSIS — M5430 Sciatica, unspecified side: Secondary | ICD-10-CM | POA: Diagnosis not present

## 2011-09-26 ENCOUNTER — Encounter (INDEPENDENT_AMBULATORY_CARE_PROVIDER_SITE_OTHER): Payer: Medicare Other

## 2011-09-26 DIAGNOSIS — R0989 Other specified symptoms and signs involving the circulatory and respiratory systems: Secondary | ICD-10-CM

## 2011-09-26 DIAGNOSIS — M5430 Sciatica, unspecified side: Secondary | ICD-10-CM | POA: Diagnosis not present

## 2011-09-26 DIAGNOSIS — M545 Low back pain, unspecified: Secondary | ICD-10-CM | POA: Diagnosis not present

## 2011-09-26 DIAGNOSIS — I6529 Occlusion and stenosis of unspecified carotid artery: Secondary | ICD-10-CM | POA: Diagnosis not present

## 2011-09-26 DIAGNOSIS — M5137 Other intervertebral disc degeneration, lumbosacral region: Secondary | ICD-10-CM | POA: Diagnosis not present

## 2011-09-28 DIAGNOSIS — M545 Low back pain, unspecified: Secondary | ICD-10-CM | POA: Diagnosis not present

## 2011-09-28 DIAGNOSIS — M5430 Sciatica, unspecified side: Secondary | ICD-10-CM | POA: Diagnosis not present

## 2011-09-28 DIAGNOSIS — M5137 Other intervertebral disc degeneration, lumbosacral region: Secondary | ICD-10-CM | POA: Diagnosis not present

## 2011-10-09 DIAGNOSIS — M25559 Pain in unspecified hip: Secondary | ICD-10-CM | POA: Diagnosis not present

## 2011-10-22 ENCOUNTER — Other Ambulatory Visit: Payer: Self-pay | Admitting: Internal Medicine

## 2011-11-12 ENCOUNTER — Other Ambulatory Visit: Payer: Self-pay | Admitting: Internal Medicine

## 2011-11-18 ENCOUNTER — Other Ambulatory Visit: Payer: Self-pay | Admitting: Internal Medicine

## 2011-11-20 DIAGNOSIS — M545 Low back pain, unspecified: Secondary | ICD-10-CM | POA: Diagnosis not present

## 2011-11-20 NOTE — Telephone Encounter (Signed)
Lipid/Hep 272.4/995.20  

## 2011-11-23 DIAGNOSIS — H2510 Age-related nuclear cataract, unspecified eye: Secondary | ICD-10-CM | POA: Diagnosis not present

## 2011-12-13 DIAGNOSIS — M19019 Primary osteoarthritis, unspecified shoulder: Secondary | ICD-10-CM | POA: Diagnosis not present

## 2011-12-13 DIAGNOSIS — H2510 Age-related nuclear cataract, unspecified eye: Secondary | ICD-10-CM | POA: Diagnosis not present

## 2011-12-20 DIAGNOSIS — H2510 Age-related nuclear cataract, unspecified eye: Secondary | ICD-10-CM | POA: Diagnosis not present

## 2011-12-20 DIAGNOSIS — H21569 Pupillary abnormality, unspecified eye: Secondary | ICD-10-CM | POA: Diagnosis not present

## 2011-12-27 DIAGNOSIS — H21569 Pupillary abnormality, unspecified eye: Secondary | ICD-10-CM | POA: Diagnosis not present

## 2011-12-27 DIAGNOSIS — H2510 Age-related nuclear cataract, unspecified eye: Secondary | ICD-10-CM | POA: Diagnosis not present

## 2011-12-27 DIAGNOSIS — H524 Presbyopia: Secondary | ICD-10-CM | POA: Diagnosis not present

## 2012-01-10 ENCOUNTER — Other Ambulatory Visit: Payer: Self-pay | Admitting: Internal Medicine

## 2012-01-18 ENCOUNTER — Other Ambulatory Visit: Payer: Self-pay | Admitting: Neurosurgery

## 2012-01-18 DIAGNOSIS — M4310 Spondylolisthesis, site unspecified: Secondary | ICD-10-CM | POA: Diagnosis not present

## 2012-01-18 DIAGNOSIS — M5137 Other intervertebral disc degeneration, lumbosacral region: Secondary | ICD-10-CM | POA: Diagnosis not present

## 2012-01-18 DIAGNOSIS — M47817 Spondylosis without myelopathy or radiculopathy, lumbosacral region: Secondary | ICD-10-CM | POA: Diagnosis not present

## 2012-01-18 DIAGNOSIS — M48061 Spinal stenosis, lumbar region without neurogenic claudication: Secondary | ICD-10-CM | POA: Diagnosis not present

## 2012-01-19 ENCOUNTER — Encounter: Payer: Self-pay | Admitting: Gastroenterology

## 2012-01-19 ENCOUNTER — Telehealth: Payer: Self-pay | Admitting: Gastroenterology

## 2012-01-19 ENCOUNTER — Telehealth: Payer: Self-pay | Admitting: *Deleted

## 2012-01-19 MED ORDER — LINACLOTIDE 145 MCG PO CAPS: 1.0000 | ORAL_CAPSULE | Freq: Every day | ORAL | Status: DC

## 2012-01-19 NOTE — Telephone Encounter (Signed)
linzess 145 mcg/day should help

## 2012-01-19 NOTE — Telephone Encounter (Signed)
Error

## 2012-01-19 NOTE — Telephone Encounter (Signed)
Please verify follow up with GI completed

## 2012-01-19 NOTE — Telephone Encounter (Signed)
GI f/u was completed.

## 2012-01-19 NOTE — Telephone Encounter (Signed)
Pt last seen 12/01/2008 and last COLON 10/19/08 with hemorrhoids and adenomatous polyp. Wife reports pt has been using Mobic and Ultram for back pain and has become constipated; a hemorrhoid also occurred. Last pm pt was hurting so bad, she gave him several suppositories and an enema with some results. Explained to wife I cannot get pt in until 01/23/12 with Willette Cluster, NP, but pt can use stool softeners and Miralax BID until then. If he gets into trouble, please call the doctor on call this weekend. Pt is having back surgery in July and he needs to be on a regime before he begins narcotic pain meds. Wife stated understanding. Dr Jarold Motto, any other orders and is the Miralax BID OK? Thanks.

## 2012-01-19 NOTE — Telephone Encounter (Signed)
Pt has contacted his GI in reference to this concern.

## 2012-01-19 NOTE — Telephone Encounter (Signed)
[facility] Triage Call Report Triage Record Num: [id number] Operator: Solon Palm Patient Name: Joshua Lopez Call Date & Time: 01/18/2012 8:55:34PM Patient Phone: [phone] PCP: Marga Melnick Patient Gender: Male PCP Fax : [phone] Patient DOB: 02/10/1930 Practice Name: [facility] Reason for Call: Caller: Fay/Spouse; PCP: Marga Melnick; CB#: [phone]; Call regarding severe constipation, onset 01/18/2012 x 2 hours ago. Has urge to go but no stool and has been straining. Is leaking some stool. Has used 3 glycerin suppositories, Fleets enema, and drinking prune juice. Last BM 01/17/2012, normal per patient and has 2 daily. Has had similar episode one week ago and finally had BM. All emergent symptoms of Constipation ruled out with Disposition See Provider within 72 Hours as " pain of feeling of pressure with BM at least twice within last week and not previously evaluated." Care Advice given per protocol and advised wife not to given any more medications other than those in Care Advice. Protocol(s) Used: Constipation Recommended Outcome per Protocol: See Provider within 72 Hours Reason for Outcome: Pain or feeling of pressure with bowel movement at least twice within last week AND not previously evaluated or not responding to provider recommended treatment Care Advice: ~ Call provider if symptoms worsen or new symptoms develop. ~ Decrease intake of binding foods such as cheese, white rice, meats, and bananas. ~ Try a warm bath or apply a warm, wet towel to rectal area to help relax the muscles in the area. Rectal pain or change in bowel habits including recurrent episodes or persistent constipation or constipation alternating with diarrhea, can be the only sign of a serious problem, such as polyps or a tumor. An evaluation by provider must be scheduled. ~ ~ SYMPTOM / CONDITION MANAGEMENT ~ CAUTIONS Constipation Care Measures: - Drink 8 to 10 glasses of  liquid per day, more if breastfeeding. - Drink warm water or coffee early in the morning. - Gradually increase dietary fiber (fresh fruits/vegetables, whole grain bread and cereals). - As tolerated, walk 30 minutes at a steady pace daily. - Consider nonprescription stool softeners (Colace) per label, pharmacist or provider recommendations. Stool softners are not habit forming as some stimulant laxatives may become. - Consider nonprescription bulk forming laxatives (such as Metamucil, FiberCon, Citrucel, etc.); follow package directions. - Avoid routine use of strong laxatives/enemas/suppositories unless ordered by provider. - Do not delay having bowel movement when having urge. - Keep a routine; attempt bowel movement within half hour after a meal or after some exercise. ~ 01/18/2012 9:27:51PM

## 2012-01-19 NOTE — Telephone Encounter (Signed)
Informed Ms. Joshua Lopez Dr Jarold Motto has ordered pt to begin [name] daily for constipation. The 1st capsule may " open him up " to where Miralax daily and a stool softener can keep him regular at times. Wife stated understanding.

## 2012-01-23 ENCOUNTER — Other Ambulatory Visit (INDEPENDENT_AMBULATORY_CARE_PROVIDER_SITE_OTHER): Payer: Medicare Other

## 2012-01-23 ENCOUNTER — Ambulatory Visit (INDEPENDENT_AMBULATORY_CARE_PROVIDER_SITE_OTHER): Payer: Medicare Other | Admitting: Nurse Practitioner

## 2012-01-23 ENCOUNTER — Encounter: Payer: Self-pay | Admitting: Nurse Practitioner

## 2012-01-23 VITALS — BP 102/70 | HR 86 | Ht 69.5 in | Wt 209.6 lb

## 2012-01-23 DIAGNOSIS — K5900 Constipation, unspecified: Secondary | ICD-10-CM

## 2012-01-23 LAB — TSH: TSH: 5.08 u[IU]/mL (ref 0.35–5.50)

## 2012-01-23 NOTE — Patient Instructions (Addendum)
Please go to the basement level to have your labs drawn.  Continue the [name] once daily. Take Miralax twice daily.  Use Stool softners.  If you go 2 days without a bowel movement use suppositories.

## 2012-01-23 NOTE — Progress Notes (Signed)
01/23/2012 Joshua Lopez [id number] Oct 30, 1929   HISTORY OF PRESENT ILLNESS: Patient is a 78 year old male known to Dr. Jarold Motto for history of chronic GERD.  He was last seen in 2010 at the time of his screening colonoscopy which was normal except for a sessile polyp in the transverse colon. Colon pathology was compatible with a tubular adenoma.. Patient called the office several days ago for acute constipation after he had been taking Mobic and Ultram for back pain. Other than that, patient has not changed any of his medications recently. We started him on twice daily MiraLax , stool softeners and gave him daily samples of [name]. Bowel movements have  significantly improved and he is here today for followup. No weight loss. No nausea. No blood in his stool. Last blood work was in February 2013, CBC was normal. He is almost out of [name] samples. Patient scheduled for back surgery on 02/08/12.     Past Medical History  Diagnosis Date  . Esophageal stricture   . Hypertension   . BPH (benign prostatic hypertrophy)     Dr. Isabel Caprice monitors  elevated PSA  . Colon polyp     2006 and 2010  . Hyperlipidemia     NMR  2007  . Subclavian steal syndrome 1997   Past Surgical History  Procedure Date  . Subclavian stent placement 1997    right side  . Prostate biopsy 2000    Dr. Earlene Plater  . Esophageal dilation 2003  . Colonoscopy w/ polypectomy     2006 and 2010; HYPERPLASTIC polyp and  . Ganglion cyst excision 1999    LUE  . Total shoulder arthroplasty 2010    Dr. Dion Saucier  . Knee arthroscopy 2001    reports that he has never smoked. He has never used smokeless tobacco. He reports that he does not drink alcohol or use illicit drugs. family history includes Breast cancer in his paternal aunt and paternal grandmother; Colon cancer in his paternal aunt and paternal uncle; Coronary artery disease in his brother; Heart attack in his paternal uncle; Hypertension in his brother and mother; Skin cancer  in his brother and mother; and Sudden death in his father. Allergies  Allergen Reactions  . Aspirin Anaphylaxis      Outpatient Encounter Prescriptions as of 01/23/2012  Medication Sig Dispense Refill  . amLODipine (NORVASC) 5 MG tablet TAKE ONE TABLET BY MOUTH EVERY DAY  90 tablet  0  . Calcium Carbonate-Vitamin D (CALTRATE 600+D PO) Take by mouth 2 (two) times daily.      . Cyanocobalamin (VITAMIN B-12 CR PO) Take 1 tablet by mouth daily.       . fish oil-omega-3 fatty acids 1000 MG capsule Take 1 g by mouth 2 (two) times daily.       . Linaclotide 145 MCG CAPS Take 1 capsule by mouth daily.  12 capsule  0  . losartan-hydrochlorothiazide (HYZAAR) 100-12.5 MG per tablet TAKE ONE TABLET BY MOUTH EVERY DAY  30 tablet  5  . montelukast (SINGULAIR) 10 MG tablet TAKE ONE TABLET BY MOUTH AT BEDTIME  30 tablet  5  . pantoprazole (PROTONIX) 40 MG tablet Take 40 mg by mouth daily. 30 minutes before a meal      . polyethylene glycol (MIRALAX / GLYCOLAX) packet Take 17 g by mouth daily.      . pravastatin (PRAVACHOL) 20 MG tablet TAKE ONE TABLET BY MOUTH EVERY DAY  90 tablet  0  . pravastatin (PRAVACHOL) 20 MG tablet TAKE  ONE TABLET BY MOUTH EVERY DAY  30 tablet  0  . SAW PALMETTO, SERENOA REPENS, PO Take 1 tablet by mouth daily.       . Tamsulosin HCl (FLOMAX) 0.4 MG CAPS 0.4 mg.       . traMADol (ULTRAM) 50 MG tablet Take 50 mg by mouth every 6 (six) hours as needed. For  Arthritis pain      . DISCONTD: montelukast (SINGULAIR) 10 MG tablet Take 10 mg by mouth at bedtime.      Marland Kitchen DISCONTD: pantoprazole (PROTONIX) 40 MG tablet TAKE ONE TABLET BY MOUTH EVERY DAY 30 MINUTES BEFORE MEALS  90 tablet  1  . DISCONTD: PRESCRIPTION MEDICATION Apply 1 application topically daily. Compounded cream for itching      . Acetaminophen (TYLENOL EXTRA STRENGTH PO) Take 1,000 mg by mouth 4 (four) times daily as needed. For pain      . amoxicillin (AMOXIL) 500 MG capsule Take 2,000 mg by mouth as needed. One hour before  dental appointment         REVIEW OF SYSTEMS  : Positive for arthritis, back pain and excessive urination. All other systems reviewed and negative except where noted in the History of Present Illness.   PHYSICAL EXAM: BP 102/70  Pulse 86  Ht 5' 9.5" (1.765 m)  Wt 209 lb 9.6 oz (95.074 kg)  BMI 30.51 kg/m2  SpO2 97% General: Well developed white male in no acute distress Head: Normocephalic and atraumatic Eyes:  sclerae anicteric,conjunctive pink. Ears: Normal auditory acuity Neck: Supple, no masses.  Lungs: Clear throughout to auscultation Heart: Regular rate and rhythm Abdomen: Soft, non distended, nontender. No masses or hepatomegaly noted. Normal Bowel sounds Rectal: Not done Musculoskeletal: Symmetrical with no gross deformities  Skin: No lesions on visible extremities Extremities: No edema or deformities noted Neurological: Alert oriented x 4, grossly nonfocal Cervical Nodes:  No significant cervical adenopathy Psychological:  Alert and cooperative. Normal mood and affect  ASSESSMENT AND PLAN;  1. Acute constipation, possibly secondary to Ultram though it does not usually cause significant constipation. He has not changed his medication regimen and any other way. Will check a TSH.  Bowel movements are back to normal after starting patient on stool softeners, twice daily MiraLax and daily [name] a few days ago. Apparently insurance denied [name]. Continue current bowel regimen, this may need to be adjusted if stools become loose or if he becomes further constipated following back surgery. We will give him additional samples of [name] while awaiting prior authorization from his insurance company.  2. GERD, asymptomatic on daily PPI

## 2012-01-24 ENCOUNTER — Encounter (HOSPITAL_COMMUNITY): Payer: Self-pay | Admitting: Pharmacist

## 2012-01-24 NOTE — Progress Notes (Signed)
I agree with assessment and plans 

## 2012-01-29 ENCOUNTER — Encounter (HOSPITAL_COMMUNITY)
Admission: RE | Admit: 2012-01-29 | Discharge: 2012-01-29 | Disposition: A | Discharge status: Home / Self Care | Payer: Medicare Other | Source: Ambulatory Visit | Attending: Neurosurgery | Admitting: Neurosurgery

## 2012-01-29 ENCOUNTER — Encounter (HOSPITAL_COMMUNITY): Payer: Self-pay | Admitting: Pharmacy Technician

## 2012-01-29 ENCOUNTER — Encounter (HOSPITAL_COMMUNITY): Payer: Self-pay

## 2012-01-29 HISTORY — DX: Unspecified osteoarthritis, unspecified site: M19.90

## 2012-01-29 HISTORY — DX: Gastro-esophageal reflux disease without esophagitis: K21.9

## 2012-01-29 LAB — BASIC METABOLIC PANEL
BUN: 29 mg/dL — ABNORMAL HIGH (ref 6–23)
CO2: 26 mEq/L (ref 19–32)
Calcium: 9.2 mg/dL (ref 8.4–10.5)
Chloride: 100 mEq/L (ref 96–112)
Creatinine, Ser: 1.48 mg/dL — ABNORMAL HIGH (ref 0.50–1.35)
GFR calc Af Amer: 50 mL/min — ABNORMAL LOW (ref >90)
GFR calc non Af Amer: 43 mL/min — ABNORMAL LOW (ref >90)
Glucose, Bld: 138 mg/dL — ABNORMAL HIGH (ref 70–99)
Potassium: 4.6 mEq/L (ref 3.5–5.1)
Sodium: 136 mEq/L (ref 135–145)

## 2012-01-29 LAB — CBC
HCT: 39.2 % (ref 39.0–52.0)
Hemoglobin: 13.5 g/dL (ref 13.0–17.0)
MCH: 30.6 pg (ref 26.0–34.0)
MCHC: 34.4 g/dL (ref 30.0–36.0)
MCV: 88.9 fL (ref 78.0–100.0)
Platelets: 178 10*3/uL (ref 150–400)
RBC: 4.41 MIL/uL (ref 4.22–5.81)
RDW: 13.1 % (ref 11.5–15.5)
WBC: 5.8 10*3/uL (ref 4.0–10.5)

## 2012-01-29 LAB — SURGICAL PCR SCREEN
MRSA, PCR: NEGATIVE
Staphylococcus aureus: NEGATIVE

## 2012-01-29 LAB — TYPE AND SCREEN
ABO/RH(D): B POS
Antibody Screen: NEGATIVE

## 2012-01-29 NOTE — Pre-Procedure Instructions (Signed)
20 Joshua Lopez  01/29/2012   Your procedure is scheduled on:  February 08, 2012 Thursday at 0730 AM  Report to [facility] at [phone].  Call this number if you have problems the morning of surgery: [phone]   Remember:   Do not eat food or drink:After Midnight. Wednesday      Take these medicines the morning of surgery with A SIP OF WATER: Amlodipine[Norvasc], Protonix, and Tramadol[Ultram]   Do not wear jewelry.  Do not wear lotions,or  powders. You may wear deodorant.  Do not shave 48 hours prior to surgery. Men may shave face and neck.  Do not bring valuables to the hospital.  Contacts, dentures or bridgework may not be worn into surgery.  Leave suitcase in the car. After surgery it may be brought to your room.  For patients admitted to the hospital, checkout time is 11:00 AM the day of discharge.   Patients discharged the day of surgery will not be allowed to drive home.    Special Instructions: CHG Shower Use Special Wash: 1/2 bottle night before surgery and 1/2 bottle morning of surgery.   Please read over the following fact sheets that you were given: Pain Booklet, Coughing and Deep Breathing, Blood Transfusion Information, MRSA Information and Surgical Site Infection Prevention

## 2012-01-30 NOTE — Consult Note (Signed)
Anesthesia Chart Review:  Patient is a 78 year old male scheduled for L2-4 decompressive laminectomies with L2-3, L3-4 PLIF on 02/08/12 by Dr. Newell Coral.  History includes former smoker, HTN, subclavian steal syndrome '97 s/p right SCA stent or right carotid-subclavian bypass, GERD, HLD, BPH, esophageal stricture s/p dilation, arthritis, right shoulder replacement '10.  He was evaluated for syncope in the ED on 09/10/11.  This occurred while at church and when sitting in a hot room.  His EKG, CXR, and labs including cardiac enzymes X 1 were felt benign.  Dr. Doylene Canard, the ED physician felt the most likely cause was a vasovagal response due to peripheral vasodilation with hypotension related to being in a warm room.  He was not admitted and did not see Cardiology or Neurology.  He did follow-up with his PCP Dr. Marga Melnick who ordered some follow-up labs and a carotid duplex.  EKG on 09/10/11 showed NSR, borderline LAD.  Carotid duplex on 09/26/11 showed 0-39% ICA stenosis bilateral, turbulent flow with elevated velocities in the right proximal CCA at the anastomosis of the [place] and vertebral artery.  CXR on 09/10/11 showed no acute cardiopulmonary process.   Labs reviewed.  BUN/Cr 29/1.48, glucose 138, CBC WNL. T&S done.  Cr was up to 1.3 in 2009, but most recently has been 1.0-1.2.  I'll repeat a BMET on arrival to ensure stability.  I also forwarded results to his PCP for additional recommendations, if any.    I reviewed syncope history with Anesthesiologist Dr. Ivin Booty.  Plan to proceed  If no recurrent syncope and follow-up labs are reasonable.    Shonna Chock, PA-C 01/30/12 1550

## 2012-02-01 ENCOUNTER — Encounter: Payer: Self-pay | Admitting: Internal Medicine

## 2012-02-01 ENCOUNTER — Ambulatory Visit (INDEPENDENT_AMBULATORY_CARE_PROVIDER_SITE_OTHER): Payer: Medicare Other | Admitting: Internal Medicine

## 2012-02-01 VITALS — BP 120/78 | HR 83 | Temp 97.6°F | Resp 12 | Ht 69.03 in | Wt 205.2 lb

## 2012-02-01 DIAGNOSIS — M48061 Spinal stenosis, lumbar region without neurogenic claudication: Secondary | ICD-10-CM

## 2012-02-01 DIAGNOSIS — N289 Disorder of kidney and ureter, unspecified: Secondary | ICD-10-CM

## 2012-02-01 DIAGNOSIS — I10 Essential (primary) hypertension: Secondary | ICD-10-CM | POA: Diagnosis not present

## 2012-02-01 NOTE — Patient Instructions (Addendum)
BUN, creatinine, and GFR  all assess kidney function. To protect the kidneys it  is important to control your blood pressure and sugar. You should also stay well hydrated. Drink to thirst, up to 40 ounces of fluids a day.  PLEASE BRING THESE INSTRUCTIONS TO FOLLOW UP  LAB APPOINTMENT 7/15.This will guarantee correct labs are drawn, eliminating need for repeat blood sampling ( needle sticks ! ). Diagnoses /Codes: renal insufficiency; BMET  Please try to go on My Chart within the next 24 hours to allow me to release the results directly to you.

## 2012-02-01 NOTE — Progress Notes (Signed)
Subjective:    Patient ID: Joshua Lopez, male    DOB: 09/30/1929, 78 y.o.   MRN: [id number]  HPI Dr. Newell Coral has scheduled his surgery for 02/08/12. L2-4 decompressive laminectomies with L2, 3 and L3, 4 posterior lumbar interbody fusion with interbody prosthesis and posterior lateral arthrodesis and posterior segment instrumentation are planned.  He has constant pain and soreness in the lumbosacral area bilaterally. He has difficulty even standing up and ambulation is painful.  There is no associated numbness, tingling, or weakness in the legs. He has no urinary stool incontinence.  Preoperative assessment was requested because of a rise in his creatinine; creatinine was 1.06 on 07/09/12 but has risen to 1.48 as of 7/8. BUN has increased from 19 up to 29. GFR is decreased from 65 to 43. There's been no associated anemia.    Review of Systems Because of severe constipation he was Rxed [name], MiraLax, and stool softener 6/28. He has up to 2 bowel movements a day which are loose. He denies frank diarrhea. He had advised by his neurosurgeon to start taking calcium supplementation and increase dairy in his diet  He does have a history of prostatic hypertrophy with incomplete voiding and hesitancy. The symptoms basically are stable since his last urologic assessment. He is  seen by the urologist annually. He is on generic Flomax    Objective:   Physical Exam General appearance is one of good health and nourishment w/o distress.  Eyes: No conjunctival inflammation or scleral icterus is present.  Oral exam: Dental hygiene is good; lips and gums are healthy appearing.There is no oropharyngeal erythema or exudate noted.   Heart:  Normal rate and regular rhythm. S1 and S2 normal without gallop, murmur, click, rub S 4    Lungs:Chest clear to auscultation; no wheezes, rhonchi,rales ,or rubs present.No increased work of breathing.   Abdomen: bowel sounds normal, soft and non-tender without  masses, organomegaly or hernias noted.  No guarding or rebound   Skin:Warm & dry.  Intact without suspicious lesions or rashes ; no jaundice or tenting  Lymphatic: No lymphadenopathy is noted about the head, neck, axilla areas.   Pulses: All pulses are intact; rumbling R carotid bruit. No renal artery bruits are present  Musculoskeletal: He exhibits discomfort lying down and sitting up. The pain in the right lumbosacral area/hip is aggravated by knee flexion and elevation of the right leg             Assessment & Plan:  #1 lumbar degenerative disc disease with spinal stenosis; this is incapacitating and surgery is certainly necessary  #2 mild renal insufficiency; there is no definite etiology for this in reviewing his medications. He and his wife maintain his oral hydration is good. This will need to be monitored. If it's progressive; losartan hydrochlorothiazide will need to be changed to ARB  without HCTZ. He will continue force oral hydration and renal function will be rechecked preoperatively.  #3 hypertension well-controlled. This is not causing #2

## 2012-02-05 ENCOUNTER — Other Ambulatory Visit (INDEPENDENT_AMBULATORY_CARE_PROVIDER_SITE_OTHER): Payer: Medicare Other

## 2012-02-05 ENCOUNTER — Other Ambulatory Visit: Payer: Self-pay | Admitting: Internal Medicine

## 2012-02-05 DIAGNOSIS — I10 Essential (primary) hypertension: Secondary | ICD-10-CM | POA: Diagnosis not present

## 2012-02-05 LAB — BASIC METABOLIC PANEL
BUN: 27 mg/dL — ABNORMAL HIGH (ref 6–23)
CO2: 28 mEq/L (ref 19–32)
Calcium: 9.4 mg/dL (ref 8.4–10.5)
Chloride: 105 mEq/L (ref 96–112)
Creatinine, Ser: 1.2 mg/dL (ref 0.4–1.5)
GFR: 63.17 mL/min (ref >60.00)
Glucose, Bld: 115 mg/dL — ABNORMAL HIGH (ref 70–99)
Potassium: 4.4 mEq/L (ref 3.5–5.1)
Sodium: 141 mEq/L (ref 135–145)

## 2012-02-06 NOTE — Progress Notes (Signed)
Labs only

## 2012-02-07 MED ORDER — CEFAZOLIN SODIUM-DEXTROSE 2-3 GM-% IV SOLR
2.0000 g | INTRAVENOUS | Status: AC
  Administered 2012-02-08: 1 g via INTRAVENOUS
  Administered 2012-02-08: 2 g via INTRAVENOUS
  Filled 2012-02-07: qty 50

## 2012-02-08 ENCOUNTER — Inpatient Hospital Stay (HOSPITAL_COMMUNITY)
Admission: RE | Admit: 2012-02-08 | Discharge: 2012-02-14 | DRG: 460 | Disposition: A | Discharge status: Home / Self Care | Payer: Medicare Other | Source: Ambulatory Visit | Attending: Neurosurgery | Admitting: Neurosurgery

## 2012-02-08 ENCOUNTER — Encounter (HOSPITAL_COMMUNITY): Payer: Self-pay | Admitting: *Deleted

## 2012-02-08 ENCOUNTER — Encounter (HOSPITAL_COMMUNITY): Payer: Self-pay | Admitting: Vascular Surgery

## 2012-02-08 ENCOUNTER — Encounter (HOSPITAL_COMMUNITY)
Admission: RE | Disposition: A | Discharge status: Home / Self Care | Payer: Self-pay | Source: Ambulatory Visit | Attending: Neurosurgery

## 2012-02-08 ENCOUNTER — Inpatient Hospital Stay (HOSPITAL_COMMUNITY): Payer: Medicare Other

## 2012-02-08 ENCOUNTER — Inpatient Hospital Stay (HOSPITAL_COMMUNITY): Payer: Medicare Other | Admitting: Vascular Surgery

## 2012-02-08 DIAGNOSIS — M48061 Spinal stenosis, lumbar region without neurogenic claudication: Secondary | ICD-10-CM | POA: Diagnosis not present

## 2012-02-08 DIAGNOSIS — K222 Esophageal obstruction: Secondary | ICD-10-CM | POA: Diagnosis present

## 2012-02-08 DIAGNOSIS — Z808 Family history of malignant neoplasm of other organs or systems: Secondary | ICD-10-CM

## 2012-02-08 DIAGNOSIS — Z87891 Personal history of nicotine dependence: Secondary | ICD-10-CM | POA: Diagnosis not present

## 2012-02-08 DIAGNOSIS — I10 Essential (primary) hypertension: Secondary | ICD-10-CM | POA: Diagnosis present

## 2012-02-08 DIAGNOSIS — M4310 Spondylolisthesis, site unspecified: Secondary | ICD-10-CM | POA: Diagnosis not present

## 2012-02-08 DIAGNOSIS — Z96619 Presence of unspecified artificial shoulder joint: Secondary | ICD-10-CM

## 2012-02-08 DIAGNOSIS — Z79899 Other long term (current) drug therapy: Secondary | ICD-10-CM

## 2012-02-08 DIAGNOSIS — Z803 Family history of malignant neoplasm of breast: Secondary | ICD-10-CM | POA: Diagnosis not present

## 2012-02-08 DIAGNOSIS — Z860100 Personal history of colon polyps, unspecified: Secondary | ICD-10-CM

## 2012-02-08 DIAGNOSIS — K5900 Constipation, unspecified: Secondary | ICD-10-CM | POA: Diagnosis present

## 2012-02-08 DIAGNOSIS — E785 Hyperlipidemia, unspecified: Secondary | ICD-10-CM | POA: Diagnosis present

## 2012-02-08 DIAGNOSIS — M545 Low back pain, unspecified: Secondary | ICD-10-CM | POA: Diagnosis not present

## 2012-02-08 DIAGNOSIS — K449 Diaphragmatic hernia without obstruction or gangrene: Secondary | ICD-10-CM | POA: Diagnosis present

## 2012-02-08 DIAGNOSIS — Z8249 Family history of ischemic heart disease and other diseases of the circulatory system: Secondary | ICD-10-CM

## 2012-02-08 DIAGNOSIS — Z800 Family history of malignant neoplasm of digestive organs: Secondary | ICD-10-CM | POA: Diagnosis not present

## 2012-02-08 DIAGNOSIS — Q762 Congenital spondylolisthesis: Secondary | ICD-10-CM

## 2012-02-08 DIAGNOSIS — N138 Other obstructive and reflux uropathy: Secondary | ICD-10-CM | POA: Diagnosis present

## 2012-02-08 DIAGNOSIS — K219 Gastro-esophageal reflux disease without esophagitis: Secondary | ICD-10-CM | POA: Diagnosis present

## 2012-02-08 DIAGNOSIS — R972 Elevated prostate specific antigen [PSA]: Secondary | ICD-10-CM | POA: Diagnosis present

## 2012-02-08 DIAGNOSIS — M48062 Spinal stenosis, lumbar region with neurogenic claudication: Secondary | ICD-10-CM | POA: Diagnosis not present

## 2012-02-08 DIAGNOSIS — R079 Chest pain, unspecified: Secondary | ICD-10-CM | POA: Diagnosis not present

## 2012-02-08 DIAGNOSIS — N401 Enlarged prostate with lower urinary tract symptoms: Secondary | ICD-10-CM | POA: Diagnosis present

## 2012-02-08 DIAGNOSIS — M51379 Other intervertebral disc degeneration, lumbosacral region without mention of lumbar back pain or lower extremity pain: Principal | ICD-10-CM | POA: Diagnosis present

## 2012-02-08 DIAGNOSIS — M47817 Spondylosis without myelopathy or radiculopathy, lumbosacral region: Secondary | ICD-10-CM | POA: Diagnosis not present

## 2012-02-08 DIAGNOSIS — R339 Retention of urine, unspecified: Secondary | ICD-10-CM | POA: Diagnosis present

## 2012-02-08 DIAGNOSIS — Z886 Allergy status to analgesic agent status: Secondary | ICD-10-CM

## 2012-02-08 DIAGNOSIS — M5137 Other intervertebral disc degeneration, lumbosacral region: Principal | ICD-10-CM | POA: Diagnosis present

## 2012-02-08 DIAGNOSIS — R062 Wheezing: Secondary | ICD-10-CM | POA: Diagnosis not present

## 2012-02-08 HISTORY — DX: Low back pain: M54.5

## 2012-02-08 HISTORY — PX: POSTERIOR FUSION LUMBAR SPINE: SUR632

## 2012-02-08 HISTORY — DX: Other chronic pain: G89.29

## 2012-02-08 LAB — BASIC METABOLIC PANEL
BUN: 22 mg/dL (ref 6–23)
CO2: 29 mEq/L (ref 19–32)
Calcium: 9.2 mg/dL (ref 8.4–10.5)
Chloride: 104 mEq/L (ref 96–112)
Creatinine, Ser: 1.23 mg/dL (ref 0.50–1.35)
GFR calc Af Amer: 63 mL/min — ABNORMAL LOW (ref >90)
GFR calc non Af Amer: 54 mL/min — ABNORMAL LOW (ref >90)
Glucose, Bld: 125 mg/dL — ABNORMAL HIGH (ref 70–99)
Potassium: 4.5 mEq/L (ref 3.5–5.1)
Sodium: 142 mEq/L (ref 135–145)

## 2012-02-08 SURGERY — POSTERIOR LUMBAR FUSION 2 LEVEL
Anesthesia: General | Site: Back | Wound class: Clean

## 2012-02-08 MED ORDER — SODIUM CHLORIDE 0.9 % IV SOLN
INTRAVENOUS | Status: DC | PRN
  Administered 2012-02-08: 13:00:00 via INTRAVENOUS

## 2012-02-08 MED ORDER — WHITE PETROLATUM GEL
Status: AC
  Administered 2012-02-08: 17:00:00
  Filled 2012-02-08: qty 5

## 2012-02-08 MED ORDER — MIDAZOLAM HCL 5 MG/5ML IJ SOLN
INTRAMUSCULAR | Status: DC | PRN
  Administered 2012-02-08 (×2): 1 mg via INTRAVENOUS

## 2012-02-08 MED ORDER — ACETAMINOPHEN 650 MG RE SUPP: 650.0000 mg | RECTAL | Status: DC | PRN

## 2012-02-08 MED ORDER — DOCUSATE SODIUM 100 MG PO CAPS
100.0000 mg | ORAL_CAPSULE | Freq: Two times a day (BID) | ORAL | Status: DC
  Administered 2012-02-08 – 2012-02-14 (×9): 100 mg via ORAL
  Filled 2012-02-08 (×9): qty 1

## 2012-02-08 MED ORDER — ACETAMINOPHEN 325 MG PO TABS: 650.0000 mg | ORAL_TABLET | ORAL | Status: DC | PRN

## 2012-02-08 MED ORDER — CEFAZOLIN SODIUM 1-5 GM-% IV SOLN
INTRAVENOUS | Status: AC
  Filled 2012-02-08: qty 50

## 2012-02-08 MED ORDER — OXYCODONE HCL 5 MG PO TABS
5.0000 mg | ORAL_TABLET | ORAL | Status: DC | PRN
  Administered 2012-02-08 – 2012-02-09 (×3): 10 mg via ORAL
  Administered 2012-02-09: 5 mg via ORAL
  Administered 2012-02-09 – 2012-02-11 (×9): 10 mg via ORAL
  Filled 2012-02-08 (×12): qty 2
  Filled 2012-02-08: qty 1

## 2012-02-08 MED ORDER — ALUM & MAG HYDROXIDE-SIMETH 200-200-20 MG/5ML PO SUSP
30.0000 mL | Freq: Four times a day (QID) | ORAL | Status: DC | PRN
  Administered 2012-02-10: 30 mL via ORAL
  Filled 2012-02-08: qty 30

## 2012-02-08 MED ORDER — CYCLOBENZAPRINE HCL 10 MG PO TABS
10.0000 mg | ORAL_TABLET | Freq: Three times a day (TID) | ORAL | Status: DC | PRN
  Administered 2012-02-09 – 2012-02-11 (×6): 10 mg via ORAL
  Filled 2012-02-08 (×6): qty 1

## 2012-02-08 MED ORDER — ONDANSETRON HCL 4 MG/2ML IJ SOLN: 4.0000 mg | Freq: Once | INTRAMUSCULAR | Status: DC | PRN

## 2012-02-08 MED ORDER — MENTHOL 3 MG MT LOZG: 1.0000 | LOZENGE | OROMUCOSAL | Status: DC | PRN

## 2012-02-08 MED ORDER — PHENOL 1.4 % MT LIQD: 1.0000 | OROMUCOSAL | Status: DC | PRN

## 2012-02-08 MED ORDER — TAMSULOSIN HCL 0.4 MG PO CAPS
0.4000 mg | ORAL_CAPSULE | Freq: Every day | ORAL | Status: DC
  Administered 2012-02-08 – 2012-02-13 (×5): 0.4 mg via ORAL
  Filled 2012-02-08 (×7): qty 1

## 2012-02-08 MED ORDER — NEOSTIGMINE METHYLSULFATE 1 MG/ML IJ SOLN
INTRAMUSCULAR | Status: DC | PRN
  Administered 2012-02-08: 4 mg via INTRAVENOUS

## 2012-02-08 MED ORDER — THROMBIN 20000 UNITS EX KIT
PACK | CUTANEOUS | Status: DC | PRN
  Administered 2012-02-08: 20000 [IU] via TOPICAL

## 2012-02-08 MED ORDER — MORPHINE SULFATE 4 MG/ML IJ SOLN: 4.0000 mg | INTRAMUSCULAR | Status: DC | PRN

## 2012-02-08 MED ORDER — LOSARTAN POTASSIUM-HCTZ 100-12.5 MG PO TABS
1.0000 | ORAL_TABLET | Freq: Every morning | ORAL | Status: DC
Start: 2012-02-08 — End: 2012-02-08

## 2012-02-08 MED ORDER — HYDROCODONE-ACETAMINOPHEN 5-325 MG PO TABS
1.0000 | ORAL_TABLET | ORAL | Status: DC | PRN
  Administered 2012-02-09 – 2012-02-14 (×12): 2 via ORAL
  Filled 2012-02-08 (×13): qty 2

## 2012-02-08 MED ORDER — EPHEDRINE SULFATE 50 MG/ML IJ SOLN
INTRAMUSCULAR | Status: DC | PRN
  Administered 2012-02-08 (×5): 10 mg via INTRAVENOUS

## 2012-02-08 MED ORDER — HYDROXYZINE HCL 25 MG PO TABS: 50.0000 mg | ORAL_TABLET | ORAL | Status: DC | PRN

## 2012-02-08 MED ORDER — PHENYLEPHRINE HCL 10 MG/ML IJ SOLN
INTRAMUSCULAR | Status: DC | PRN
  Administered 2012-02-08 (×10): 80 ug via INTRAVENOUS

## 2012-02-08 MED ORDER — LINACLOTIDE 145 MCG PO CAPS
145.0000 ug | ORAL_CAPSULE | Freq: Every day | ORAL | Status: DC
  Administered 2012-02-09 – 2012-02-14 (×4): 145 ug via ORAL
  Filled 2012-02-08 (×6): qty 1

## 2012-02-08 MED ORDER — PROPOFOL 10 MG/ML IV EMUL
INTRAVENOUS | Status: DC | PRN
  Administered 2012-02-08: 130 mg via INTRAVENOUS

## 2012-02-08 MED ORDER — PANTOPRAZOLE SODIUM 40 MG PO TBEC
40.0000 mg | DELAYED_RELEASE_TABLET | Freq: Every day | ORAL | Status: DC
  Administered 2012-02-09 – 2012-02-14 (×6): 40 mg via ORAL
  Filled 2012-02-08 (×6): qty 1

## 2012-02-08 MED ORDER — ONDANSETRON HCL 4 MG/2ML IJ SOLN
INTRAMUSCULAR | Status: DC | PRN
  Administered 2012-02-08: 4 mg via INTRAVENOUS

## 2012-02-08 MED ORDER — ALBUMIN HUMAN 5 % IV SOLN
INTRAVENOUS | Status: DC | PRN
  Administered 2012-02-08: 10:00:00 via INTRAVENOUS

## 2012-02-08 MED ORDER — KETOROLAC TROMETHAMINE 30 MG/ML IJ SOLN
15.0000 mg | Freq: Four times a day (QID) | INTRAMUSCULAR | Status: AC
  Administered 2012-02-09 – 2012-02-10 (×6): 15 mg via INTRAVENOUS
  Filled 2012-02-08 (×8): qty 1

## 2012-02-08 MED ORDER — FENTANYL CITRATE 0.05 MG/ML IJ SOLN
INTRAMUSCULAR | Status: DC | PRN
  Administered 2012-02-08: 100 ug via INTRAVENOUS
  Administered 2012-02-08 (×2): 50 ug via INTRAVENOUS

## 2012-02-08 MED ORDER — LINACLOTIDE 145 MCG PO CAPS
145.0000 ug | ORAL_CAPSULE | Freq: Every day | ORAL | Status: DC
  Filled 2012-02-08: qty 1

## 2012-02-08 MED ORDER — HYDROXYZINE HCL 50 MG/ML IM SOLN
50.0000 mg | INTRAMUSCULAR | Status: DC | PRN
  Filled 2012-02-08: qty 1

## 2012-02-08 MED ORDER — ROCURONIUM BROMIDE 100 MG/10ML IV SOLN
INTRAVENOUS | Status: DC | PRN
  Administered 2012-02-08: 50 mg via INTRAVENOUS

## 2012-02-08 MED ORDER — BUPIVACAINE HCL (PF) 0.5 % IJ SOLN
INTRAMUSCULAR | Status: DC | PRN
  Administered 2012-02-08: 50 mL

## 2012-02-08 MED ORDER — MAGNESIUM HYDROXIDE 400 MG/5ML PO SUSP: 30.0000 mL | Freq: Every day | ORAL | Status: DC | PRN

## 2012-02-08 MED ORDER — KETOROLAC TROMETHAMINE 30 MG/ML IJ SOLN: 15.0000 mg | Freq: Once | INTRAMUSCULAR | Status: DC

## 2012-02-08 MED ORDER — AMLODIPINE BESYLATE 5 MG PO TABS
5.0000 mg | ORAL_TABLET | Freq: Every morning | ORAL | Status: DC
  Administered 2012-02-09 – 2012-02-14 (×6): 5 mg via ORAL
  Filled 2012-02-08 (×6): qty 1

## 2012-02-08 MED ORDER — POLYETHYLENE GLYCOL 3350 17 G PO PACK
17.0000 g | PACK | Freq: Two times a day (BID) | ORAL | Status: DC
  Administered 2012-02-08 – 2012-02-14 (×8): 17 g via ORAL
  Filled 2012-02-08 (×14): qty 1

## 2012-02-08 MED ORDER — BISACODYL 10 MG RE SUPP
10.0000 mg | Freq: Every day | RECTAL | Status: DC | PRN
  Administered 2012-02-11: 10 mg via RECTAL
  Filled 2012-02-08: qty 1

## 2012-02-08 MED ORDER — HYDROMORPHONE HCL PF 1 MG/ML IJ SOLN
INTRAMUSCULAR | Status: AC
  Filled 2012-02-08: qty 1

## 2012-02-08 MED ORDER — LIDOCAINE HCL (CARDIAC) 20 MG/ML IV SOLN
INTRAVENOUS | Status: DC | PRN
  Administered 2012-02-08: 100 mg via INTRAVENOUS

## 2012-02-08 MED ORDER — HEMOSTATIC AGENTS (NO CHARGE) OPTIME
TOPICAL | Status: DC | PRN
  Administered 2012-02-08: 1 via TOPICAL

## 2012-02-08 MED ORDER — HYDROCHLOROTHIAZIDE 25 MG PO TABS
12.5000 mg | ORAL_TABLET | Freq: Every day | ORAL | Status: DC
  Administered 2012-02-09 – 2012-02-12 (×4): 12.5 mg via ORAL
  Filled 2012-02-08 (×5): qty 0.5

## 2012-02-08 MED ORDER — SODIUM CHLORIDE 0.9 % IV SOLN
INTRAVENOUS | Status: AC
  Filled 2012-02-08: qty 500

## 2012-02-08 MED ORDER — ACETAMINOPHEN 10 MG/ML IV SOLN
INTRAVENOUS | Status: AC
  Filled 2012-02-08: qty 100

## 2012-02-08 MED ORDER — SODIUM CHLORIDE 0.9 % IV SOLN: 250.0000 mL | INTRAVENOUS | Status: DC

## 2012-02-08 MED ORDER — SODIUM CHLORIDE 0.9 % IR SOLN
Status: DC | PRN
  Administered 2012-02-08: 09:00:00

## 2012-02-08 MED ORDER — SODIUM CHLORIDE 0.9 % IJ SOLN: 3.0000 mL | INTRAMUSCULAR | Status: DC | PRN

## 2012-02-08 MED ORDER — ZOLPIDEM TARTRATE 5 MG PO TABS: 5.0000 mg | ORAL_TABLET | Freq: Every evening | ORAL | Status: DC | PRN

## 2012-02-08 MED ORDER — DEXTROSE-NACL 5-0.45 % IV SOLN: INTRAVENOUS | Status: DC

## 2012-02-08 MED ORDER — HYDROMORPHONE HCL PF 1 MG/ML IJ SOLN
0.2500 mg | INTRAMUSCULAR | Status: DC | PRN
  Administered 2012-02-08 (×3): 0.5 mg via INTRAVENOUS

## 2012-02-08 MED ORDER — SODIUM CHLORIDE 0.9 % IV SOLN
10.0000 mg | INTRAVENOUS | Status: DC | PRN
  Administered 2012-02-08: 5 ug/min via INTRAVENOUS

## 2012-02-08 MED ORDER — SIMVASTATIN 10 MG PO TABS
10.0000 mg | ORAL_TABLET | Freq: Every day | ORAL | Status: DC
  Administered 2012-02-08 – 2012-02-14 (×6): 10 mg via ORAL
  Filled 2012-02-08 (×7): qty 1

## 2012-02-08 MED ORDER — 0.9 % SODIUM CHLORIDE (POUR BTL) OPTIME
TOPICAL | Status: DC | PRN
  Administered 2012-02-08 (×2): 1000 mL

## 2012-02-08 MED ORDER — MONTELUKAST SODIUM 10 MG PO TABS
10.0000 mg | ORAL_TABLET | Freq: Every day | ORAL | Status: DC
  Administered 2012-02-08 – 2012-02-13 (×5): 10 mg via ORAL
  Filled 2012-02-08 (×7): qty 1

## 2012-02-08 MED ORDER — BACITRACIN 50000 UNITS IM SOLR
INTRAMUSCULAR | Status: AC
  Filled 2012-02-08: qty 1

## 2012-02-08 MED ORDER — ACETAMINOPHEN 10 MG/ML IV SOLN
INTRAVENOUS | Status: DC | PRN
  Administered 2012-02-08: 1000 mg via INTRAVENOUS

## 2012-02-08 MED ORDER — LOSARTAN POTASSIUM 50 MG PO TABS
100.0000 mg | ORAL_TABLET | Freq: Every day | ORAL | Status: DC
  Administered 2012-02-09 – 2012-02-14 (×6): 100 mg via ORAL
  Filled 2012-02-08 (×6): qty 2

## 2012-02-08 MED ORDER — VECURONIUM BROMIDE 10 MG IV SOLR
INTRAVENOUS | Status: DC | PRN
  Administered 2012-02-08 (×2): 5 mg via INTRAVENOUS
  Administered 2012-02-08: 2 mg via INTRAVENOUS

## 2012-02-08 MED ORDER — SODIUM CHLORIDE 0.9 % IJ SOLN
3.0000 mL | Freq: Two times a day (BID) | INTRAMUSCULAR | Status: DC
  Administered 2012-02-09 – 2012-02-11 (×4): 3 mL via INTRAVENOUS

## 2012-02-08 MED ORDER — GLYCOPYRROLATE 0.2 MG/ML IJ SOLN
INTRAMUSCULAR | Status: DC | PRN
  Administered 2012-02-08: 0.6 mg via INTRAVENOUS

## 2012-02-08 MED ORDER — LACTATED RINGERS IV SOLN
INTRAVENOUS | Status: DC | PRN
  Administered 2012-02-08 (×3): via INTRAVENOUS

## 2012-02-08 MED ORDER — ACETAMINOPHEN 10 MG/ML IV SOLN
1000.0000 mg | Freq: Four times a day (QID) | INTRAVENOUS | Status: AC
  Administered 2012-02-08 – 2012-02-09 (×4): 1000 mg via INTRAVENOUS
  Filled 2012-02-08 (×4): qty 100

## 2012-02-08 MED ORDER — LIDOCAINE-EPINEPHRINE 1 %-1:100000 IJ SOLN
INTRAMUSCULAR | Status: DC | PRN
  Administered 2012-02-08: 20 mL

## 2012-02-08 SURGICAL SUPPLY — 85 items
BAG DECANTER FOR FLEXI CONT (MISCELLANEOUS) ×2 IMPLANT
BENZOIN TINCTURE PRP APPL 2/3 (GAUZE/BANDAGES/DRESSINGS) IMPLANT
BLADE SURG ROTATE 9660 (MISCELLANEOUS) IMPLANT
BRUSH SCRUB EZ PLAIN DRY (MISCELLANEOUS) ×2 IMPLANT
BUR ACORN 6.0 ACORN (BURR) ×2 IMPLANT
BUR ACRON 5.0MM COATED (BURR) ×4 IMPLANT
BUR MATCHSTICK NEURO 3.0 LAGG (BURR) ×2 IMPLANT
CANISTER SUCTION 2500CC (MISCELLANEOUS) ×2 IMPLANT
CAP LCK SPNE (Orthopedic Implant) ×6 IMPLANT
CAP LOCK SPINE RADIUS (Orthopedic Implant) ×6 IMPLANT
CAP LOCKING (Orthopedic Implant) ×6 IMPLANT
CLOTH BEACON ORANGE TIMEOUT ST (SAFETY) ×2 IMPLANT
CONT SPEC 4OZ CLIKSEAL STRL BL (MISCELLANEOUS) ×2 IMPLANT
COVER BACK TABLE 24X17X13 BIG (DRAPES) IMPLANT
COVER TABLE BACK 60X90 (DRAPES) ×2 IMPLANT
CROSSLINK MEDIUM (Orthopedic Implant) ×2 IMPLANT
DERMABOND ADVANCED (GAUZE/BANDAGES/DRESSINGS) ×1
DERMABOND ADVANCED .7 DNX12 (GAUZE/BANDAGES/DRESSINGS) ×1 IMPLANT
DRAPE C-ARM 42X72 X-RAY (DRAPES) ×6 IMPLANT
DRAPE LAPAROTOMY 100X72X124 (DRAPES) ×2 IMPLANT
DRAPE POUCH INSTRU U-SHP 10X18 (DRAPES) ×2 IMPLANT
DRAPE PROXIMA HALF (DRAPES) IMPLANT
DRAPE SURG 17X23 STRL (DRAPES) ×2 IMPLANT
DRESSING TELFA 8X3 (GAUZE/BANDAGES/DRESSINGS) IMPLANT
DRSG EMULSION OIL 3X3 NADH (GAUZE/BANDAGES/DRESSINGS) IMPLANT
DURAPREP 26ML APPLICATOR (WOUND CARE) ×2 IMPLANT
ELECT REM PT RETURN 9FT ADLT (ELECTROSURGICAL) ×2
ELECTRODE REM PT RTRN 9FT ADLT (ELECTROSURGICAL) ×1 IMPLANT
GAUZE SPONGE 4X4 16PLY XRAY LF (GAUZE/BANDAGES/DRESSINGS) ×2 IMPLANT
GLOVE BIOGEL PI IND STRL 8 (GLOVE) ×2 IMPLANT
GLOVE BIOGEL PI INDICATOR 8 (GLOVE) ×2
GLOVE ECLIPSE 7.5 STRL STRAW (GLOVE) ×6 IMPLANT
GLOVE EXAM NITRILE LRG STRL (GLOVE) IMPLANT
GLOVE EXAM NITRILE MD LF STRL (GLOVE) ×4 IMPLANT
GLOVE EXAM NITRILE XL STR (GLOVE) IMPLANT
GLOVE EXAM NITRILE XS STR PU (GLOVE) IMPLANT
GLOVE INDICATOR 7.0 STRL GRN (GLOVE) ×4 IMPLANT
GLOVE SKINSENSE NS SZ7.0 (GLOVE) ×4
GLOVE SKINSENSE STRL SZ7.0 (GLOVE) ×4 IMPLANT
GOWN BRE IMP SLV AUR LG STRL (GOWN DISPOSABLE) ×2 IMPLANT
GOWN BRE IMP SLV AUR XL STRL (GOWN DISPOSABLE) ×8 IMPLANT
GOWN STRL REIN 2XL LVL4 (GOWN DISPOSABLE) IMPLANT
KIT BASIN OR (CUSTOM PROCEDURE TRAY) ×2 IMPLANT
KIT INFUSE SMALL (Orthopedic Implant) ×2 IMPLANT
KIT POSITION SURG JACKSON T1 (MISCELLANEOUS) ×2 IMPLANT
KIT ROOM TURNOVER OR (KITS) ×2 IMPLANT
MILL MEDIUM DISP (BLADE) ×2 IMPLANT
NEEDLE 18GX1X1/2 (RX/OR ONLY) (NEEDLE) ×2 IMPLANT
NEEDLE BONE MARROW 8GAX6 (NEEDLE) IMPLANT
NEEDLE HYPO 25X1 1.5 SAFETY (NEEDLE) ×2 IMPLANT
NEEDLE SPNL 18GX3.5 QUINCKE PK (NEEDLE) ×2 IMPLANT
NS IRRIG 1000ML POUR BTL (IV SOLUTION) ×2 IMPLANT
PACK LAMINECTOMY NEURO (CUSTOM PROCEDURE TRAY) ×2 IMPLANT
PACK VITOSS BIOACTIVE 10CC (Neuro Prosthesis/Implant) ×2 IMPLANT
PAD ARMBOARD 7.5X6 YLW CONV (MISCELLANEOUS) ×6 IMPLANT
PATTIES SURGICAL .5 X.5 (GAUZE/BANDAGES/DRESSINGS) ×2 IMPLANT
PATTIES SURGICAL .5 X1 (DISPOSABLE) IMPLANT
PATTIES SURGICAL 1X1 (DISPOSABLE) ×2 IMPLANT
PEEK PLIF AVS 11X25X4 (Peek) ×8 IMPLANT
ROD 5.5X60MM GREEN (Rod) ×2 IMPLANT
ROD 70MM (Rod) ×1 IMPLANT
ROD SPNL 70X5.5XNS TI RDS (Rod) ×1 IMPLANT
SCREW 5.75X45MM (Screw) ×8 IMPLANT
SCREW 5.75X50MM (Screw) ×4 IMPLANT
SPONGE GAUZE 4X4 12PLY (GAUZE/BANDAGES/DRESSINGS) ×2 IMPLANT
SPONGE LAP 4X18 X RAY DECT (DISPOSABLE) ×4 IMPLANT
SPONGE NEURO XRAY DETECT 1X3 (DISPOSABLE) ×2 IMPLANT
SPONGE SURGIFOAM ABS GEL 100 (HEMOSTASIS) IMPLANT
STAPLER SKIN PROX WIDE 3.9 (STAPLE) IMPLANT
STRIP BIOACTIVE VITOSS 25X100X (Neuro Prosthesis/Implant) ×2 IMPLANT
STRIP CLOSURE SKIN 1/2X4 (GAUZE/BANDAGES/DRESSINGS) ×2 IMPLANT
SUT PROLENE 6 0 BV (SUTURE) IMPLANT
SUT VIC AB 1 CT1 18XBRD ANBCTR (SUTURE) ×3 IMPLANT
SUT VIC AB 1 CT1 8-18 (SUTURE) ×3
SUT VIC AB 2-0 CP2 18 (SUTURE) ×6 IMPLANT
SYR 20CC LL (SYRINGE) ×2 IMPLANT
SYR 3ML LL SCALE MARK (SYRINGE) ×8 IMPLANT
SYR 5ML LL (SYRINGE) IMPLANT
SYR INSULIN 1ML 31GX6 SAFETY (SYRINGE) IMPLANT
TAPE CLOTH SURG 4X10 WHT LF (GAUZE/BANDAGES/DRESSINGS) ×2 IMPLANT
TOWEL OR 17X24 6PK STRL BLUE (TOWEL DISPOSABLE) ×4 IMPLANT
TOWEL OR 17X26 10 PK STRL BLUE (TOWEL DISPOSABLE) ×2 IMPLANT
TRAP SPECIMEN MUCOUS 40CC (MISCELLANEOUS) ×2 IMPLANT
TRAY FOLEY CATH 14FRSI W/METER (CATHETERS) ×2 IMPLANT
WATER STERILE IRR 1000ML POUR (IV SOLUTION) ×2 IMPLANT

## 2012-02-08 NOTE — Anesthesia Procedure Notes (Signed)
Procedure Name: Intubation Date/Time: 02/08/2012 7:36 AM Performed by: Quentin Ore Pre-anesthesia Checklist: Patient identified, Emergency Drugs available, Suction available, Patient being monitored and Timeout performed Patient Re-evaluated:Patient Re-evaluated prior to inductionOxygen Delivery Method: Circle system utilized Preoxygenation: Pre-oxygenation with 100% oxygen Intubation Type: IV induction Ventilation: Mask ventilation without difficulty Laryngoscope Size: Mac and 3 Grade View: Grade II Tube type: Oral Tube size: 7.5 mm Number of attempts: 1 Airway Equipment and Method: Stylet Placement Confirmation: ETT inserted through vocal cords under direct vision,  positive ETCO2,  CO2 detector and breath sounds checked- equal and bilateral Secured at: 22 cm Tube secured with: Tape Dental Injury: Teeth and Oropharynx as per pre-operative assessment

## 2012-02-08 NOTE — Anesthesia Preprocedure Evaluation (Addendum)
Anesthesia Evaluation  Patient identified by MRN, date of birth, ID band Patient awake    Reviewed: Allergy & Precautions, H&P , NPO status , Patient's Chart, lab work & pertinent test results, reviewed documented beta blocker date and time   Airway Mallampati: II TM Distance: >3 FB Neck ROM: Full    Dental  (+) Teeth Intact   Pulmonary former smoker,  breath sounds clear to auscultation        Cardiovascular hypertension, Pt. on medications     Neuro/Psych  Neuromuscular disease    GI/Hepatic GERD-  Controlled,  Endo/Other    Renal/GU      Musculoskeletal   Abdominal   Peds  Hematology   Anesthesia Other Findings   Reproductive/Obstetrics                         Anesthesia Physical Anesthesia Plan  ASA: III  Anesthesia Plan: General   Post-op Pain Management:    Induction: Intravenous  Airway Management Planned: Oral ETT  Additional Equipment:   Intra-op Plan:   Post-operative Plan: Extubation in OR  Informed Consent: I have reviewed the patients History and Physical, chart, labs and discussed the procedure including the risks, benefits and alternatives for the proposed anesthesia with the patient or authorized representative who has indicated his/her understanding and acceptance.   Dental advisory given  Plan Discussed with: CRNA and Surgeon  Anesthesia Plan Comments:        Anesthesia Quick Evaluation

## 2012-02-08 NOTE — Preoperative (Signed)
Beta Blockers   Reason not to administer Beta Blockers:Not Applicable 

## 2012-02-08 NOTE — Addendum Note (Signed)
Addendum  created 02/08/12 1455 by Quentin Ore, CRNA   Modules edited:Anesthesia Blocks and Procedures, Inpatient Notes

## 2012-02-08 NOTE — H&P (Signed)
Subjective:  Patient is a 78 y.o. male who is admitted for treatment of a multilevel multifactorial lumbar stenosis with multilevel dynamic degenerative spinal listhesis. Patient initially developed right lumbar radicular pain 15 months ago, more recently he's developed some left lumbar radicular discomfort, but the right side is still worse than the left. He's been treated with end-stage, analgesics, and epidural steroid injections, but in the end none of these have given her much in the way of relief. He is become more and more limited in his ability to walk because of pain. At this point he walk less than a quarter mile before he needs to rest, and then resume walking.  He was evaluated with x-rays and MRI scan. The x-rays show a dynamic grade 2 spondylolisthesis at L3-L4 and a grade 1 dynamic degenerative spinal disease at L2 on 3. MRI scan shows advanced facet arthropathy at both L3-4 and L2-3 and has multilevel multifactorial lumbar stenosis at both L3-4 and L2-3.  He's admitted now for lumbar decompression and arthrodesis specifically and L2-L4 decompressive lumbar laminectomy, and L2-3 and L3-4 posterior lumbar interbody arthrodesis with interbody implants and bone graft, and a bilateral L2-L4 posterior lumbar arthrodesis with postreduction patient and bone graft.    Patient Active Problem List   Diagnosis Date Noted  . DEGENERATIVE JOINT DISEASE, ADVANCED 01/22/2009  . CONSTIPATION 12/01/2008  . COLONIC POLYPS, HX OF 12/01/2008  . ARTHRITIS, GENERALIZED 06/04/2008  . SPINAL STENOSIS, LUMBAR 06/04/2008  . HIATAL HERNIA 05/15/2007  . HYPERLIPIDEMIA 04/11/2007  . HYPERTENSION, ESSENTIAL NOS 04/11/2007  . GERD 04/11/2007  . BENIGN PROSTATIC HYPERTROPHY 04/11/2007  . ESOPHAGEAL STRICTURE 12/12/2006  . DIZZINESS 12/12/2006  . HYPERGLYCEMIA, HX OF 12/12/2006  . PSA, INCREASED 12/12/2006  . GANGLION CYST, HX OF 12/12/2006  . ARTHROSCOPY, KNEE, HX OF 12/12/2006   Past Medical History    Diagnosis Date  . Esophageal stricture   . Hypertension   . BPH (benign prostatic hypertrophy)     Dr. Isabel Caprice monitors  elevated PSA  . Colon polyp     2006 and 2010  . Hyperlipidemia     NMR  2007  . Subclavian steal syndrome 1997  . GERD (gastroesophageal reflux disease)   . Arthritis     Past Surgical History  Procedure Date  . Subclavian stent placement 1997    right side  . Prostate biopsy 2000    Dr. Earlene Plater  . Esophageal dilation 2003  . Colonoscopy w/ polypectomy     2006 and 2010; HYPERPLASTIC polyp and  . Ganglion cyst excision 1999    LUE  . Total shoulder arthroplasty 2010    Dr. Dion Saucier  . Knee arthroscopy 2001  . Joint replacement     Prescriptions prior to admission  Medication Sig Dispense Refill  . amLODipine (NORVASC) 5 MG tablet Take 5 mg by mouth every morning.      . Calcium Carbonate-Vitamin D (CALTRATE 600+D PO) Take 1 tablet by mouth 2 (two) times daily.       . Cyanocobalamin 2500 MCG TABS Take 1,250 mcg by mouth every morning.      . diclofenac (FLECTOR) 1.3 % PTCH Place 0.5 patches onto the skin daily as needed. For back pain      . docusate sodium (COLACE) 100 MG capsule Take 100 mg by mouth 2 (two) times daily.      . fish oil-omega-3 fatty acids 1000 MG capsule Take 1 g by mouth 2 (two) times daily.       Marland Kitchen  Linaclotide ([name]) 145 MCG CAPS Take 145 mcg by mouth at bedtime.      Marland Kitchen losartan-hydrochlorothiazide (HYZAAR) 100-12.5 MG per tablet Take 1 tablet by mouth every morning.      . montelukast (SINGULAIR) 10 MG tablet Take 10 mg by mouth at bedtime.      . pantoprazole (PROTONIX) 40 MG tablet Take 40 mg by mouth daily before breakfast. 30 minutes before a meal      . polyethylene glycol (MIRALAX / GLYCOLAX) packet Take 17 g by mouth 2 (two) times daily.       . pravastatin (PRAVACHOL) 20 MG tablet Take 20 mg by mouth at bedtime.      . SAW PALMETTO, SERENOA REPENS, PO Take 1 tablet by mouth every morning.       . Tamsulosin HCl (FLOMAX) 0.4  MG CAPS Take 0.4 mg by mouth at bedtime.       . traMADol (ULTRAM) 50 MG tablet Take 50 mg by mouth every 6 (six) hours as needed. For back pain      . triamcinolone cream (KENALOG) 0.1 % Apply 1 application topically daily as needed. For itchy skin      . amLODipine (NORVASC) 5 MG tablet TAKE ONE TABLET BY MOUTH EVERY DAY  90 tablet  1  . amoxicillin (AMOXIL) 500 MG capsule Take 2,000 mg by mouth once as needed. One hour before dental appointment       Allergies  Allergen Reactions  . Aspirin Anaphylaxis    Dyspnea, eyelid edema, truncal urticaria    History  Substance Use Topics  . Smoking status: Former Smoker -- 1.0 packs/day for 33 years    Types: Cigarettes    Quit date: 07/24/1973  . Smokeless tobacco: Former Neurosurgeon    Types: Chew  . Alcohol Use: No    Family History  Problem Relation Age of Onset  . Sudden death Father     murdered  . Hypertension Mother   . Skin cancer Mother   . Skin cancer Brother   . Hypertension Brother   . Coronary artery disease Brother     CABG  . Colon cancer Paternal Uncle   . Breast cancer Paternal Grandmother   . Breast cancer Paternal Aunt   . Colon cancer Paternal Aunt   . Heart attack Paternal Uncle   . Kidney disease Neg Hx      Review of Systems A comprehensive review of systems was negative.  Objective: Vital signs in last 24 hours: Temp:  [97.7 F (36.5 C)] 97.7 F (36.5 C) (07/18 0615) Pulse Rate:  [54] 54  (07/18 0615) Resp:  [18] 18  (07/18 0615) BP: (123)/(70) 123/70 mmHg (07/18 0615) SpO2:  [98 %] 98 % (07/18 0615)  EXAM: Patient is a well-developed well-nourished white male in no acute distress. Lungs are clear to auscultation , the patient has symmetrical respiratory excursion. Heart has a regular rate and rhythm normal S1 and S2 no murmur.   Abdomen is soft nontender nondistended bowel sounds are present. Extremity examination shows no clubbing cyanosis or edema. Musculoskeletal examination shows noticed a patient  over the lumbar spinous processes or paralumbar musculature. He is able to flex to 90 and able to extend to 10. Straight leg raising is negative bilaterally. Motor examination shows 5 over 5 strength in the lower extremities including the iliopsoas quadriceps dorsiflexor extensor hallicus  longus and plantar flexor bilaterally. Sensation is intact to pinprick in the distal lower extremities. Reflexes are symmetrical bilaterally. No  pathologic reflexes are present. Patient has a normal gait and stance.   Data Review:CBC    Component Value Date/Time   WBC 5.8 01/29/2012 1345   RBC 4.41 01/29/2012 1345   HGB 13.5 01/29/2012 1345   HCT 39.2 01/29/2012 1345   PLT 178 01/29/2012 1345   MCV 88.9 01/29/2012 1345   MCH 30.6 01/29/2012 1345   MCHC 34.4 01/29/2012 1345   RDW 13.1 01/29/2012 1345   LYMPHSABS 1.2 09/10/2011 1339   MONOABS 0.4 09/10/2011 1339   EOSABS 0.2 09/10/2011 1339   BASOSABS 0.0 09/10/2011 1339                          BMET    Component Value Date/Time   NA 142 02/08/2012 0611   K 4.5 02/08/2012 0611   CL 104 02/08/2012 0611   CO2 29 02/08/2012 0611   GLUCOSE 125* 02/08/2012 0611   BUN 22 02/08/2012 0611   CREATININE 1.23 02/08/2012 0611   CALCIUM 9.2 02/08/2012 0611   GFRNONAA 54* 02/08/2012 0611   GFRAA 63* 02/08/2012 0611     Assessment/Plan: Patient with advanced degenerative changes in the lumbar spine, worse at L3-4, than most so at L2-3. He is admitted for L2-L4 decompressive lumbar laminectomy, and L2-3 and L3-4 PLIF and PLA. I've discussed with the patient the nature of his condition, the nature the surgical procedure, the typical length of surgery, hospital stay, and overall recuperation, the limitations postoperatively, and risks of surgery. I discussed risks including risks of infection, bleeding, possibly need for transfusion, the risk of nerve root dysfunction with pain, weakness, numbness, or paresthesias, the risk of dural tear and CSF leakage and possible need for further surgery,  the risk of failure of the arthrodesis and possibly for further surgery, the risk of anesthetic complications including myocardial infarction, stroke, pneumonia, and death. We discussed the need for postoperative immobilization in a lumbar brace. Understanding all this the patient does wish to proceed with surgery and is admitted for such.     Hewitt Shorts, MD 02/08/2012 7:18 AM

## 2012-02-08 NOTE — Op Note (Signed)
02/08/2012  1:36 PM  PATIENT:  Joshua Lopez  78 y.o. male  PRE-OPERATIVE DIAGNOSIS:  L3-4 grade 2 dynamic degenerative spondylolisthesis, L2-3 grade 1 dynamic degenerative spondylolisthesis, lumbar stenosis, lumbar spondylosis, lumbar degenerative disc disease, neurogenic claudication  POST-OPERATIVE DIAGNOSIS:   L3-4 grade 2 dynamic degenerative spondylolisthesis, L2-3 grade 1 dynamic degenerative spondylolisthesis, lumbar stenosis, lumbar spondylosis, lumbar degenerative disc disease, neurogenic claudication   PROCEDURE:  Procedure(s): POSTERIOR LUMBAR FUSION 2 LEVEL: L2-L4 decompressive lumbar laminectomy, with decompression of the L2, L3, and L4 nerve roots bilaterally, with microdissection and microsurgical technique, with decompression beyond that required for interbody arthrodesis, bilateral L2-3 and L3-4 posterior lumbar interbody arthrodesis with AVS peek interbody implants, locally harvested morcellized autograft, Vitoss BA with bone marrow aspirate, and infuse, and a L2-L4 posterior lateral arthrodesis with radius posterior instrumentation, Vitoss BA with bone marrow aspirate, and infuse  SURGEON:  Surgeon(s): Hewitt Shorts, MD Clydene Fake, MD  ASSISTANTS: Clydene Fake, M.D.  ANESTHESIA:   general  EBL:  Total I/O In: 3550 [I.V.:2850; Blood:450; IV Piggyback:250] Out: 1165 [Urine:215; Blood:950]  BLOOD ADMINISTERED:450 CC CELLSAVER  COUNT: Correct per nursing staff  DICTATION: Patient was brought to the operating room placed under general endotracheal anesthesia. The patient was turned to prone position, the lumbar region was prepped with Betadine soap and solution and draped in a sterile fashion. The midline was infiltrated with local anesthesia with epinephrine. A localizing x-ray was taken and then a midline incision was made and carried down through the subcutaneous tissue, bipolar cautery and electrocautery were used to maintain hemostasis. Dissection was  carried down to the lumbar fascia. The fascia was incised bilaterally and the paraspinal muscles were dissected with a spinous process and lamina in a subperiosteal fashion. Another x-ray was taken for localization and the L2, L3, and L4 levels was localized. Dissection was then carried out laterally over the facet complexes and the transverse processes of L2, L3, and L4 were exposed and decorticated. Decompression via laminectomy was performed using the double-action rongeurs, high-speed drill, and Kerrison punches. Bone was saved, and cleaned of soft tissue, and processed through a bone mill, to be later used as autograft. Dissection was carried out laterally including facetectomy and foraminotomies with decompression of the stenotic compression of the L2, L3, and L4 nerve roots. Once the decompression of the stenotic compression of the thecal sac and exiting nerve roots was completed we proceeded with the posterior lumbar interbody arthrodesis. The annulus at each level was incised bilaterally and the disc space entered. A thorough discectomy was performed using pituitary rongeurs and curettes. We then measured the height of the intervertebral disc space. We selected 11 x 25 x 4 AVS peek interbody implants for the L2-3 and L3-4 levels.  The C-arm fluoroscope was then draped and brought in the field and we identified the pedicle entry points bilaterally at the L2, L3, and L4 levels. Each of the 6 pedicles was probed, we aspirated bone marrow aspirate from the vertebral bodies, this was injected over 2 10 cc strips of Vitoss BA. Then each of the pedicles was examined with the ball probe, good bony surfaces were found and no bony cuts were found. Each of the pedicles was then tapped with a 5.25 mm tap, again examined with the ball probe good threading was found and no bony cuts were found. We then placed 5.75 by 45 millimeter screws bilaterally at the L2 level, 5.75 by 50 millimeter screws bilaterally at the L3  level, and  5.75 by 45 millimeter screws bilaterally at the L4 level.  We then packed the AVS peek interbody implants with locally harvested morcellized autograft, and then placed the first implant at the L2-3 level on the right side, carefully retracting the thecal sac and nerve root medially. We then went back to the left side and packed the midline with additional Vitoss BA with bone marrow aspirate and infuse, and then placed a second implant on the left side again retracting the thecal sac and nerve root medially. Additional Vitoss BA with bone marrow aspirate was packed lateral to the implants.  Then at the L3-4 level, we placed the first implant on the right side, carefully retracting the thecal sac and nerve root medially. We then went back to the left side and packed the midline with additional Vitoss BA with bone marrow aspirate and infuse, and then placed a second implant on the left side, again retracting the thecal sac and nerve root medially. Additional Vitoss BA with bone marrow aspirate was packed lateral to the implants.   We then packed the lateral gutter over the transverse processes and intertransverse space with Vitoss BA with bone marrow aspirate and infuse. We then selected a 60 mm rod for the right side and a 70 mm rod for the left side, they were placed within the screw heads and secured with locking caps once all 6 locking caps were placed final tightening was performed against a counter torque. We placed a variable medium cross connector between the screws at L2 and L3, once it was locked down to the rods, the central locking system was tightened.  The wound had been irrigated multiple times during the procedure with saline solution and bacitracin solution, good hemostasis was established with a combination of bipolar cautery and Gelfoam with thrombin. Once good hemostasis was confirmed we proceeded with closure paraspinal muscles deep fascia and Scarpa's fascia were closed with  interrupted undyed 1 Vicryl sutures the subcutaneous and subcuticular closed with interrupted inverted 2-0 undyed Vicryl sutures the skin edges were approximated with Dermabond. The wound was dressed with sterile gauze and Hypafix.  Following surgery the patient was turned back to the supine position to be reversed and the anesthetic extubated and transferred to the recovery room for further care. In the recovery room he is noted be moving all 4 extremities well to command.    PLAN OF CARE: Admit to inpatient   PATIENT DISPOSITION:  PACU - hemodynamically stable.   Delay start of Pharmacological VTE agent (>24hrs) due to surgical blood loss or risk of bleeding:  yes

## 2012-02-08 NOTE — Anesthesia Postprocedure Evaluation (Signed)
Anesthesia Post Note  Patient: Joshua Lopez  Procedure(s) Performed: Procedure(s) (LRB): POSTERIOR LUMBAR FUSION 2 LEVEL (N/A)  Anesthesia type: general  Patient location: PACU  Post pain: Pain level controlled  Post assessment: Patient's Cardiovascular Status Stable  Last Vitals:  Filed Vitals:   02/08/12 1416  BP: 102/54  Pulse: 74  Temp:   Resp: 23    Post vital signs: Reviewed and stable  Level of consciousness: sedated  Complications: No apparent anesthesia complications

## 2012-02-08 NOTE — Progress Notes (Signed)
Filed Vitals:   02/08/12 1416 02/08/12 1431 02/08/12 1442 02/08/12 1505  BP: 102/54 104/55  113/63  Pulse: 74 75  76  Temp:   97.7 F (36.5 C) 97.6 F (36.4 C)  TempSrc:    Oral  Resp: 23 22  20   SpO2: 97% 95%  98%    BMET  Basename 02/08/12 0611  NA 142  K 4.5  CL 104  CO2 29  GLUCOSE 125*  BUN 22  CREATININE 1.23  CALCIUM 9.2    Patient resting comfortably in bed. No significant pain. Dressing clean and dry. Foley to straight drainage. Nurse planning on assisting the patient with ambulation this evening. Will leave Foley in overnight, but plan on removing it in the morning and monitoring voiding function. Good strength in lower extremities.  Plan: Doing well following surgery today. We'll plan on progressing through postoperative recovery.  Hewitt Shorts, MD 02/08/2012, 9:24 PM

## 2012-02-08 NOTE — Transfer of Care (Signed)
Immediate Anesthesia Transfer of Care Note  Patient: Joshua Lopez  Procedure(s) Performed: Procedure(s) (LRB): POSTERIOR LUMBAR FUSION 2 LEVEL (N/A)  Patient Location: PACU  Anesthesia Type: General  Level of Consciousness: awake, alert  and oriented  Airway & Oxygen Therapy: Patient Spontanous Breathing and Patient connected to face mask oxygen  Post-op Assessment: Report given to PACU RN, Post -op Vital signs reviewed and stable and Patient moving all extremities X 4  Post vital signs: Reviewed and stable  Complications: No apparent anesthesia complications

## 2012-02-08 NOTE — Addendum Note (Signed)
Addendum  created 02/08/12 1455 by Irisha Grandmaison E Anderson Coppock, CRNA   Modules edited:Anesthesia Blocks and Procedures, Inpatient Notes    

## 2012-02-09 NOTE — Evaluation (Signed)
Physical Therapy Evaluation Patient Details Name: Joshua Lopez MRN: [id number] DOB: 30-Nov-1929 Today's Date: 02/09/2012 Time: 1308-6578 PT Time Calculation (min): 39 min  PT Assessment / Plan / Recommendation Clinical Impression  Pt is a 78 y/o male s/p lumbar fusion who present with decreased mobility. Pt will benefit from Acute PT intervention to maximize functional independence. Pt lives with wife and has daughter who lives nearby.  Pt plan to discharge to home with HHPT. Pt may need to consider short term SNF placement depending on pt's functional recovery.  Unlikely pt's wife would be able to provide him the amount of assistance he required today to prevent falling.      PT Assessment  Patient needs continued PT services    Follow Up Recommendations  Home health PT;Supervision/Assistance - 24 hour (May need to consider SNF)    Barriers to Discharge Decreased caregiver support      Equipment Recommendations  Rolling walker with 5" wheels;3 in 1 bedside comode    Recommendations for Other Services     Frequency Min 5X/week    Precautions / Restrictions Precautions Precautions: Back Precaution Booklet Issued: Yes (comment) Required Braces or Orthoses: Spinal Brace Spinal Brace: Applied in standing position Restrictions Weight Bearing Restrictions: No   Pertinent Vitals/Pain 8/10 pain in low back radiating to bilateral LEs.  RN notified.  Pt was medicated prior to session.        Mobility  Bed Mobility Bed Mobility: Rolling Right;Rolling Left;Right Sidelying to Sit;Left Sidelying to Sit;Sit to Sidelying Right Rolling Right: 4: Min guard Rolling Left: 4: Min guard;With rail Right Sidelying to Sit: 4: Min guard;HOB flat Left Sidelying to Sit: 4: Min assist;HOB flat Sit to Sidelying Right: 4: Min assist;HOB flat Details for Bed Mobility Assistance: Instructed pt in safe techniques to maintain back precautons and minimize pain. Pt required assist to raise LEs into bed in  right sidelying.  Transfers Transfers: Sit to Stand;Stand to Sit Sit to Stand: 3: Mod assist;2: Max assist;With upper extremity assist;From bed;From chair/3-in-1;With armrests Stand to Sit: 4: Min assist;To chair/3-in-1;To bed;With upper extremity assist Details for Transfer Assistance: multiple trials of each transfer with pt experiencing severe pain in lumbar region and loss of  LE extensor tone control . Pt relies heavily on UEs  Ambulation/Gait Ambulation/Gait Assistance: 4: Min assist Ambulation Distance (Feet): 100 Feet Assistive device: Rolling walker Ambulation/Gait Assistance Details: Repeated cueing for proper distancing from walker.  Pt presents with occasional episodes of LE buckling especially with change of direction.   Gait Pattern: Decreased stride length;Lateral hip instability;Trunk flexed Stairs: No Wheelchair Mobility Wheelchair Mobility: No    Exercises     PT Diagnosis: Difficulty walking;Abnormality of gait;Generalized weakness;Acute pain  PT Problem List: Decreased strength;Decreased activity tolerance;Decreased mobility;Decreased knowledge of use of DME;Decreased knowledge of precautions;Pain;Cardiopulmonary status limiting activity PT Treatment Interventions: DME instruction;Gait training;Stair training;Functional mobility training;Therapeutic activities;Patient/family education   PT Goals Acute Rehab PT Goals PT Goal Formulation: With patient Time For Goal Achievement: 02/16/12 Potential to Achieve Goals: Good Pt will Roll Supine to Right Side: Independently PT Goal: Rolling Supine to Right Side - Progress: Goal set today Pt will Roll Supine to Left Side: Independently PT Goal: Rolling Supine to Left Side - Progress: Goal set today Pt will go Supine/Side to Sit: with modified independence;with HOB 0 degrees PT Goal: Supine/Side to Sit - Progress: Goal set today Pt will go Sit to Supine/Side: with modified independence;with HOB 0 degrees PT Goal: Sit to  Supine/Side - Progress: Goal  set today Pt will go Sit to Stand: with modified independence;with upper extremity assist PT Goal: Sit to Stand - Progress: Goal set today Pt will Transfer Bed to Chair/Chair to Bed: with modified independence PT Transfer Goal: Bed to Chair/Chair to Bed - Progress: Goal set today Pt will Ambulate: 51 - 150 feet;with supervision;with least restrictive assistive device PT Goal: Ambulate - Progress: Goal set today Pt will Go Up / Down Stairs: 3-5 stairs;with supervision;with least restrictive assistive device PT Goal: Up/Down Stairs - Progress: Goal set today  Visit Information  Last PT Received On: 02/09/12 Assistance Needed: +1    Subjective Data  Subjective: my legs feel weak Patient Stated Goal: Walk without pain   Prior Functioning  Home Living Lives With: Spouse Available Help at Discharge: Family;Available 24 hours/day Type of Home: House Home Access: Stairs to enter [employer] of Steps: 3 Entrance Stairs-Rails: Right Home Layout: One level Bathroom Shower/Tub: Walk-in shower;Door [employer] Toilet: Handicapped height Bathroom Accessibility: Yes How Accessible: Accessible via walker Home Adaptive Equipment: Dan Humphreys - four wheeled;Crutches Prior Function Level of Independence: Needs assistance Needs Assistance: Dressing Dressing: Minimal Able to Take Stairs?: Yes Driving: Yes Vocation: Retired Musician: No difficulties Dominant Hand: Right    Cognition  Overall Cognitive Status: Appears within functional limits for tasks assessed/performed Arousal/Alertness: Awake/alert Orientation Level: Appears intact for tasks assessed Behavior During Session: [facility] for tasks performed    Extremity/Trunk Assessment Right Upper Extremity Assessment RUE ROM/Strength/Tone: Deficits RUE ROM/Strength/Tone Deficits: Total shoulder replacement 3 years ago. ROM limited in flexion  Left Upper Extremity Assessment LUE  ROM/Strength/Tone: Within functional levels Right Lower Extremity Assessment RLE ROM/Strength/Tone: Deficits RLE ROM/Strength/Tone Deficits: generalized weakness in LEs with functional activities most noticable with sit-stand transfers.  RLE Sensation: WFL - Light Touch;WFL - Proprioception RLE Coordination: WFL - gross motor Left Lower Extremity Assessment LLE ROM/Strength/Tone: Deficits LLE ROM/Strength/Tone Deficits: Same as R LE LLE Sensation: WFL - Light Touch;WFL - Proprioception LLE Coordination: WFL - gross motor Trunk Assessment Trunk Assessment: Kyphotic   Balance Balance Balance Assessed: Yes Static Sitting Balance Static Sitting - Balance Support: Feet supported;Bilateral upper extremity supported Static Sitting - Level of Assistance: 6: Modified independent (Device/Increase time)  End of Session PT - End of Session Equipment Utilized During Treatment: Gait belt;Back brace Activity Tolerance: Patient limited by fatigue;Patient limited by pain (O2 sats dropped to mid 80s and RR increased ) Patient left: in chair;with call bell/phone within reach;Other (comment) (OT in room with pt. ) Nurse Communication: Mobility status;Other (comment);Precautions (response to activity)  GP     Veretta Sabourin 02/09/2012, 2:20 PM  Amayiah Gosnell L. Harmon Bommarito DPT [phone]

## 2012-02-09 NOTE — Progress Notes (Signed)
Attempted to walk Pt tonight, but Pt was to weak while standing up.  When he had a spasm him knees would buckle. I was able to get him to walk in place slowly for approximately 5 mins.  Will attempt to walk in the morning.

## 2012-02-09 NOTE — Progress Notes (Signed)
Pt  States feels as if not emptying bladder.  Bladder scan showed an empty bladder will continue to monitor.

## 2012-02-09 NOTE — Evaluation (Signed)
Occupational Therapy Evaluation Patient Details Name: Joshua Lopez MRN: [id number] DOB: 1929-12-19 Today's Date: 02/09/2012 Time: 0865-7846 OT Time Calculation (min): 25 min  OT Assessment / Plan / Recommendation Clinical Impression  Pt admitted for lumbar fusion impeding mobility and ADL.  Will follow acutely for ADL training following back precautions.  Pt has good support at home     OT Assessment  Patient needs continued OT Services    Follow Up Recommendations  Home health OT;Supervision/Assistance - 24 hour (depending on progress)    Barriers to Discharge      Equipment Recommendations  Rolling walker with 5" wheels;3 in 1 bedside comode    Recommendations for Other Services    Frequency  Min 2X/week    Precautions / Restrictions Precautions Precautions: Fall;Back Precaution Booklet Issued: Yes (comment) Required Braces or Orthoses: Spinal Brace Spinal Brace: Applied in standing position Restrictions Weight Bearing Restrictions: No   Pertinent Vitals/Pain *7-8/10 RN informed    ADL  Eating/Feeding: Simulated;Independent Where Assessed - Eating/Feeding: Chair Grooming: Performed;Wash/dry hands;Minimal assistance Where Assessed - Grooming: Supported standing Upper Body Bathing: Simulated;Minimal assistance Where Assessed - Upper Body Bathing: Unsupported sitting Lower Body Bathing: Simulated;Minimal assistance Where Assessed - Lower Body Bathing: Supported sit to stand Upper Body Dressing: Simulated;Set up Where Assessed - Upper Body Dressing: Unsupported sitting Lower Body Dressing: Performed;Minimal assistance Where Assessed - Lower Body Dressing: Unsupported sitting;Supported sit to stand Toilet Transfer: Simulated;Moderate assistance Toilet Transfer Method: Sit to stand Equipment Used: Sock aid;Rolling walker;Reacher;Long-handled sponge;Long-handled shoe horn;Gait belt Transfers/Ambulation Related to ADLs:   Pt with LE weakness. ADL Comments: Pt instructed  in use of LB AE and in back precautions related to ADL.  Needs reinforcement.    OT Diagnosis: Generalized weakness;Hemiplegia non-dominant side  OT Problem List: Decreased strength;Decreased activity tolerance;Impaired balance (sitting and/or standing);Decreased knowledge of use of DME or AE;Decreased knowledge of precautions;Obesity OT Treatment Interventions: Self-care/ADL training;DME and/or AE instruction;Patient/family education   OT Goals Acute Rehab OT Goals OT Goal Formulation: With patient Time For Goal Achievement: 02/09/12 Potential to Achieve Goals: Good ADL Goals Pt Will Perform Grooming: with supervision;Standing at sink ADL Goal: Grooming - Progress: Goal set today Pt Will Perform Lower Body Bathing: with supervision;Sit to stand from chair;with adaptive equipment ADL Goal: Lower Body Bathing - Progress: Goal set today Pt Will Perform Lower Body Dressing: with supervision;with adaptive equipment;Sit to stand from chair ADL Goal: Lower Body Dressing - Progress: Goal set today Pt Will Transfer to Toilet: with supervision;Ambulation;with DME;Maintaining back safety precautions ADL Goal: Toilet Transfer - Progress: Goal set today Pt Will Perform Toileting - Clothing Manipulation: with supervision;Standing ADL Goal: Toileting - Clothing Manipulation - Progress: Goal set today Pt Will Perform Toileting - Hygiene: Independently;Sit to stand from 3-in-1/toilet ADL Goal: Toileting - Hygiene - Progress: Goal set today Miscellaneous OT Goals Miscellaneous OT Goal #1: Pt will generalize back precautions in ADL independently. OT Goal: Miscellaneous Goal #1 - Progress: Goal set today Miscellaneous OT Goal #2: Pt will donn and doff back brace independently.  Visit Information  Last OT Received On: 02/09/12 Assistance Needed: +1    Subjective Data  Subjective: "I have those reachers from the hardware store." Patient Stated Goal: Return to gardening.   Prior  Functioning  Vision/Perception  Home Living Lives With: Spouse Available Help at Discharge: Family;Available 24 hours/day Type of Home: House Home Access: Stairs to enter [employer] of Steps: 3 Entrance Stairs-Rails: Right Home Layout: One level Bathroom Shower/Tub: Walk-in shower;Door Bathroom Toilet: Handicapped height  Bathroom Accessibility: Yes How Accessible: Accessible via walker Home Adaptive Equipment: Walker - four wheeled;Crutches Prior Function Level of Independence: Needs assistance Needs Assistance: Dressing Dressing: Minimal Able to Take Stairs?: Yes Driving: Yes Vocation: Retired Musician: No difficulties Dominant Hand: Right      Cognition  Overall Cognitive Status: Appears within functional limits for tasks assessed/performed Arousal/Alertness: Awake/alert Orientation Level: Appears intact for tasks assessed Behavior During Session: [facility] for tasks performed    Extremity/Trunk Assessment Right Upper Extremity Assessment RUE ROM/Strength/Tone: [facility] for tasks assessed (hx of TSR) RUE ROM/Strength/Tone Deficits: Total shoulder replacement 3 years ago.   Left Upper Extremity Assessment LUE ROM/Strength/Tone: Within functional levels  Trunk Assessment Trunk Assessment: Kyphotic   Mobility Bed Mobility Bed Mobility: Not assessed Transfers Transfers: Sit to Stand;Stand to Sit Sit to Stand: 3: Mod assist;With upper extremity assist;From chair/3-in-1 Stand to Sit: 4: Min assist;To chair/3-in-1;To bed;With upper extremity assist Details for Transfer Assistance: Pt relies heavily on UEs    Exercise    Balance Balance Balance Assessed: Yes Static Sitting Balance Static Sitting - Balance Support: Feet supported;Bilateral upper extremity supported Static Sitting - Level of Assistance: 6: Modified independent (Device/Increase time)  End of Session OT - End of Session Activity Tolerance: Patient tolerated treatment well Patient  left: in chair;with call bell/phone within reach Nurse Communication: Other (comment) (pain)  GO     Evern Bio 02/09/2012, 3:07 PM [phone]

## 2012-02-09 NOTE — Progress Notes (Signed)
Filed Vitals:   02/08/12 2100 02/09/12 0004 02/09/12 0100 02/09/12 0533  BP: 114/75  118/54 115/57  Pulse: 77  78 76  Temp: 97.5 F (36.4 C)  97.3 F (36.3 C) 97.2 F (36.2 C)  TempSrc: Oral  Oral Oral  Resp: 18  18 18   Height:  5\' 9"  (1.753 m)    Weight:  92.647 kg (204 lb 4 oz)    SpO2: 97%  100% 100%     Patient sitting up in bed, in brace. Nursing staff reports that he's a bit unsteady when he gets up, and that his legs have a bit of a tendency to give way. I believe his strength and stability will steadily improve over the coming day or 2, but nursing staff is asked for PT and OT consultation stenosis been placed. Foley DC'd this morning, no forget. Nursing staff to closely monitor voiding function with bladder scan is as needed, and out catheterizations as needed and if patient requires more than to it out catheterizations, a Foley will be left to straight drainage. We'll also notify me in case an alpha adrenergic blocker as needed. Encouraged patient to ambulate.   Plan: PT and OT consults. Dressing change in a.m. Encouraged patient to ambulate.  Hewitt Shorts, MD 02/09/2012, 8:23 AM

## 2012-02-09 NOTE — Progress Notes (Signed)
CHARTING ERROR

## 2012-02-10 LAB — URINALYSIS, ROUTINE W REFLEX MICROSCOPIC
Bilirubin Urine: NEGATIVE
Glucose, UA: NEGATIVE mg/dL
Hgb urine dipstick: NEGATIVE
Ketones, ur: NEGATIVE mg/dL
Leukocytes, UA: NEGATIVE
Nitrite: NEGATIVE
Protein, ur: 30 mg/dL — AB
Specific Gravity, Urine: 1.015 (ref 1.005–1.030)
Urobilinogen, UA: 0.2 mg/dL (ref 0.0–1.0)
pH: 5.5 (ref 5.0–8.0)

## 2012-02-10 LAB — URINE MICROSCOPIC-ADD ON

## 2012-02-10 NOTE — Progress Notes (Signed)
Pt ambulated x 524ft. Tolerated well. Sitting up in chair now. Instructed needed to ambulate at least once more before bed. [employer]

## 2012-02-10 NOTE — Progress Notes (Signed)
Filed Vitals:   02/09/12 1812 02/09/12 2116 02/10/12 0315 02/10/12 0640  BP: 134/61 130/56 126/50 143/72  Pulse: 72 74 72 98  Temp: 97.5 F (36.4 C) 98 F (36.7 C) 98.6 F (37 C) 99.7 F (37.6 C)  TempSrc: Oral Oral Oral Oral  Resp: 18 18 18 18   Height:      Weight:      SpO2: 96% 94% 93% 96%     Patient making progress. Ambulated in the halls today with PT. Nurse notes that he is complaining of bladder spasms, but bladder scan is has shown low bladder volumes, patient already on Flomax prior to admission. We'll check UA and urine culture by a clean catch. Dressing removed, wound clean and dry. Will leave open to air.  Plan: Encouraged to ambulate at least 4 times a day.  Hewitt Shorts, MD 02/10/2012, 9:43 AM

## 2012-02-10 NOTE — Progress Notes (Signed)
Physical Therapy Treatment Patient Details Name: FATEH KINDLE MRN: [id number] DOB: February 20, 1930 Today's Date: 02/10/2012 Time: 0102-7253 PT Time Calculation (min): 24 min  PT Assessment / Plan / Recommendation Comments on Treatment Session  Patient progressing with mobility this session. Patient with overall weakness and increased pain. Patient also become somewhat nauseated after session.     Follow Up Recommendations  Home health PT;Skilled nursing facility;Other (comment) (depending on progress)    Barriers to Discharge        Equipment Recommendations  3 in 1 bedside comode;Rolling walker with 5" wheels    Recommendations for Other Services    Frequency Min 5X/week   Plan Discharge plan remains appropriate;Frequency remains appropriate    Precautions / Restrictions Precautions Precautions: Fall;Back Precaution Booklet Issued: Yes (comment) Precaution Comments: Patient able to recall all precautions with some cueing. Patient given new handout as he states he didn't have his.  Required Braces or Orthoses: Spinal Brace Spinal Brace: Applied in sitting position   Pertinent Vitals/Pain     Mobility  Bed Mobility Sit to Sidelying Right: 4: Min assist;With rail;HOB flat Details for Bed Mobility Assistance: Patient required assitance for LEs back into bed. Cues throughout for no twisting.  Transfers Sit to Stand: 4: Min assist;With upper extremity assist;From chair/3-in-1 Stand to Sit: To bed;4: Min assist;With upper extremity assist Details for Transfer Assistance: A to ensure balance and safety. Cues for upright standing and to extend knees. Cues for safe technique and not to pull up on RW.  Ambulation/Gait Ambulation/Gait Assistance: 4: Min assist Ambulation Distance (Feet): 200 Feet Assistive device: Rolling walker Ambulation/Gait Assistance Details: Cues for upright posture and to extend knees and increase weight through LEs. A for balance and positioning of RW.  Gait  Pattern: Step-through pattern;Decreased stride length;Trunk flexed    Exercises     PT Diagnosis:    PT Problem List:   PT Treatment Interventions:     PT Goals Acute Rehab PT Goals PT Goal: Sit to Supine/Side - Progress: Progressing toward goal PT Goal: Sit to Stand - Progress: Progressing toward goal PT Transfer Goal: Bed to Chair/Chair to Bed - Progress: Progressing toward goal PT Goal: Ambulate - Progress: Progressing toward goal  Visit Information  Last PT Received On: 02/10/12 Assistance Needed: +1    Subjective Data      Cognition  Overall Cognitive Status: Appears within functional limits for tasks assessed/performed Arousal/Alertness: Awake/alert Orientation Level: Appears intact for tasks assessed Behavior During Session: [facility] for tasks performed    Balance     End of Session PT - End of Session Equipment Utilized During Treatment: Gait belt;Back brace Activity Tolerance: Patient tolerated treatment well Patient left: in bed;with call bell/phone within reach;with family/visitor present Nurse Communication: Mobility status;Patient requests pain meds   GP     Fredrich Birks 02/10/2012, 9:02 AM 02/10/2012 Fredrich Birks PTA [phone] pager [phone] office

## 2012-02-10 NOTE — Progress Notes (Signed)
Patient walked with walker and nurse assistance around posterior portion of unit.  Joshua Lopez

## 2012-02-11 LAB — URINE CULTURE
Colony Count: NO GROWTH
Culture: NO GROWTH

## 2012-02-11 NOTE — Progress Notes (Signed)
Physical Therapy Treatment Patient Details Name: Joshua Lopez MRN: [id number] DOB: Jan 05, 1930 Today's Date: 02/11/2012 Time: 6295-2841 PT Time Calculation (min): 23 min  PT Assessment / Plan / Recommendation Comments on Treatment Session  Pt progressing well during all mobility, although still limited due to LE weakness and decreased extension with weight bearing. Pt completed stairs well, educated him and his wife regarding safety upon d/c. Continue per plan prior to d/c    Follow Up Recommendations  Home health PT;Supervision/Assistance - 24 hour    Barriers to Discharge        Equipment Recommendations  3 in 1 bedside comode;Rolling walker with 5" wheels    Recommendations for Other Services    Frequency Min 5X/week   Plan Discharge plan remains appropriate;Frequency remains appropriate    Precautions / Restrictions Precautions Precautions: Fall;Back Precaution Booklet Issued: Yes (comment) Precaution Comments: Pt able to recall 2/3 back precautions. Pt reeducated. Required Braces or Orthoses: Spinal Brace Spinal Brace: Applied in sitting position Restrictions Weight Bearing Restrictions: No       Mobility  Bed Mobility Sit to Sidelying Right: 4: Min assist;With rail;HOB flat Details for Bed Mobility Assistance: VC for proper sequencing. Assist through trunk for stability into sideltying. VC for back precautions throughout transfer Transfers Transfers: Sit to Stand;Stand to Sit Sit to Stand: 4: Min assist;With upper extremity assist;From chair/3-in-1;From bed Stand to Sit: 4: Min assist;With upper extremity assist;To bed Details for Transfer Assistance: VC for proper hand placement and sequencing. Pt with difficulty extending legs upon standing, assist through pelvis. Pt with wincing and increased pain during stand. Ambulation/Gait Ambulation/Gait Assistance: 4: Min guard Ambulation Distance (Feet): 200 Feet Assistive device: Rolling walker Ambulation/Gait Assistance  Details: Pt relying heavily on UEs during ambulation, difficulty maintaining full extension as well as trunk extension. VC throughout for proper posturing and safety. No buckling Gait Pattern: Step-through pattern;Decreased stride length;Trunk flexed;Right flexed knee in stance;Left flexed knee in stance Gait velocity: decreased gait speed Stairs: Yes Stairs Assistance: 4: Min assist Stairs Assistance Details (indicate cue type and reason): VC for proper sequencing. Pt and wife educated on proper sequencing and safety using RW on stairs. Wife provided with handout Stair Management Technique: No rails;Backwards;With walker;Step to pattern Number of Stairs: 2       PT Goals Acute Rehab PT Goals PT Goal Formulation: With patient PT Goal: Rolling Supine to Right Side - Progress: Progressing toward goal PT Goal: Rolling Supine to Left Side - Progress: Progressing toward goal PT Goal: Supine/Side to Sit - Progress: Progressing toward goal PT Goal: Sit to Supine/Side - Progress: Progressing toward goal PT Goal: Sit to Stand - Progress: Progressing toward goal PT Transfer Goal: Bed to Chair/Chair to Bed - Progress: Progressing toward goal PT Goal: Ambulate - Progress: Progressing toward goal PT Goal: Up/Down Stairs - Progress: Progressing toward goal  Visit Information  Last PT Received On: 02/11/12 Assistance Needed: +1    Subjective Data      Cognition  Overall Cognitive Status: Appears within functional limits for tasks assessed/performed Arousal/Alertness: Awake/alert Orientation Level: Appears intact for tasks assessed Behavior During Session: [facility] for tasks performed    Balance  Balance Balance Assessed: Yes Static Standing Balance Static Standing - Balance Support: Bilateral upper extremity supported;During functional activity Static Standing - Level of Assistance: Other (comment) (minguard assist) Static Standing - Comment/# of Minutes: Pt standing at toilet for ~2 minutes  with minguard for safety. Increased knee flexion although pt stable.  End of Session PT -  End of Session Equipment Utilized During Treatment: Gait belt;Back brace Activity Tolerance: Patient tolerated treatment well Patient left: in bed;with call bell/phone within reach;with family/visitor present Nurse Communication: Mobility status;Patient requests pain meds     Milana Kidney 02/11/2012, 9:57 AM  02/11/2012 Milana Kidney DPT PAGER: [phone] OFFICE: [phone]

## 2012-02-11 NOTE — Progress Notes (Signed)
Patient ID: Joshua Lopez, male   DOB: 1930-05-08, 78 y.o.   MRN: [id number] Subjective:  The patient is alert and pleasant. He looks well. He feels better today.  Objective: Vital signs in last 24 hours: Temp:  [97.2 F (36.2 C)-98.6 F (37 C)] 98 F (36.7 C) (07/21 0625) Pulse Rate:  [80-98] 87  (07/21 0625) Resp:  [18-20] 19  (07/21 0625) BP: (115-155)/(60-84) 115/77 mmHg (07/21 0625) SpO2:  [93 %-98 %] 98 % (07/21 0625)  Intake/Output from previous day: 07/20 0701 - 07/21 0700 In: -  Out: 1050 [Urine:1050] Intake/Output this shift:    Physical exam the patient is alert and oriented. He is moving his lower extremity 12.  Lab Results: No results found for this basename: WBC:2,HGB:2,HCT:2,PLT:2 in the last 72 hours BMET No results found for this basename: NA:2,K:2,CL:2,CO2:2,GLUCOSE:2,BUN:2,CREATININE:2,CALCIUM:2 in the last 72 hours  Studies/Results: No results found.  Assessment/Plan: Postop day #3: We will continue to mobilize him with PT and OT. He will likely go home and next few days.  LOS: 3 days     Marlana Mckowen D 02/11/2012, 9:41 AM

## 2012-02-12 ENCOUNTER — Inpatient Hospital Stay (HOSPITAL_COMMUNITY): Payer: Medicare Other

## 2012-02-12 DIAGNOSIS — R062 Wheezing: Secondary | ICD-10-CM | POA: Diagnosis not present

## 2012-02-12 DIAGNOSIS — R079 Chest pain, unspecified: Secondary | ICD-10-CM | POA: Diagnosis not present

## 2012-02-12 LAB — URINALYSIS, ROUTINE W REFLEX MICROSCOPIC
Bilirubin Urine: NEGATIVE
Glucose, UA: NEGATIVE mg/dL
Ketones, ur: NEGATIVE mg/dL
Nitrite: NEGATIVE
Protein, ur: NEGATIVE mg/dL
Specific Gravity, Urine: 1.009 (ref 1.005–1.030)
Urobilinogen, UA: 0.2 mg/dL (ref 0.0–1.0)
pH: 6 (ref 5.0–8.0)

## 2012-02-12 LAB — CBC WITH DIFFERENTIAL/PLATELET
Basophils Absolute: 0 10*3/uL (ref 0.0–0.1)
Basophils Relative: 1 % (ref 0–1)
Eosinophils Absolute: 0.4 10*3/uL (ref 0.0–0.7)
Eosinophils Relative: 6 % — ABNORMAL HIGH (ref 0–5)
HCT: 28.9 % — ABNORMAL LOW (ref 39.0–52.0)
Hemoglobin: 10.3 g/dL — ABNORMAL LOW (ref 13.0–17.0)
Lymphocytes Relative: 11 % — ABNORMAL LOW (ref 12–46)
Lymphs Abs: 0.6 10*3/uL — ABNORMAL LOW (ref 0.7–4.0)
MCH: 31.3 pg (ref 26.0–34.0)
MCHC: 35.6 g/dL (ref 30.0–36.0)
MCV: 87.8 fL (ref 78.0–100.0)
Monocytes Absolute: 0.7 10*3/uL (ref 0.1–1.0)
Monocytes Relative: 11 % (ref 3–12)
Neutro Abs: 4.2 10*3/uL (ref 1.7–7.7)
Neutrophils Relative %: 71 % (ref 43–77)
Platelets: 161 10*3/uL (ref 150–400)
RBC: 3.29 MIL/uL — ABNORMAL LOW (ref 4.22–5.81)
RDW: 13.3 % (ref 11.5–15.5)
WBC: 5.9 10*3/uL (ref 4.0–10.5)

## 2012-02-12 LAB — URINE MICROSCOPIC-ADD ON

## 2012-02-12 LAB — BASIC METABOLIC PANEL
BUN: 23 mg/dL (ref 6–23)
CO2: 29 mEq/L (ref 19–32)
Calcium: 8.4 mg/dL (ref 8.4–10.5)
Chloride: 93 mEq/L — ABNORMAL LOW (ref 96–112)
Creatinine, Ser: 1.34 mg/dL (ref 0.50–1.35)
GFR calc Af Amer: 56 mL/min — ABNORMAL LOW (ref >90)
GFR calc non Af Amer: 49 mL/min — ABNORMAL LOW (ref >90)
Glucose, Bld: 160 mg/dL — ABNORMAL HIGH (ref 70–99)
Potassium: 5.2 mEq/L — ABNORMAL HIGH (ref 3.5–5.1)
Sodium: 131 mEq/L — ABNORMAL LOW (ref 135–145)

## 2012-02-12 MED ORDER — FUROSEMIDE 20 MG PO TABS
20.0000 mg | ORAL_TABLET | Freq: Once | ORAL | Status: AC
  Administered 2012-02-12: 20 mg via ORAL
  Filled 2012-02-12: qty 1

## 2012-02-12 MED FILL — Sodium Chloride IV Soln 0.9%: INTRAVENOUS | Qty: 1000 | Status: AC

## 2012-02-12 MED FILL — Hydrocodone-Acetaminophen Tab 5-325 MG: ORAL | Qty: 2 | Status: AC

## 2012-02-12 MED FILL — Heparin Sodium (Porcine) Inj 1000 Unit/ML: INTRAMUSCULAR | Qty: 30 | Status: AC

## 2012-02-12 MED FILL — Sodium Chloride Irrigation Soln 0.9%: Qty: 3000 | Status: AC

## 2012-02-12 MED FILL — Alum & Mag Hydroxide-Simethicone Susp 200-200-20 MG/5ML: ORAL | Qty: 30 | Status: AC

## 2012-02-12 NOTE — Progress Notes (Signed)
Occupational Therapy Treatment Patient Details Name: Joshua Lopez MRN: [id number] DOB: 13-Jan-1930 Today's Date: 02/12/2012 Time: 2130-8657 OT Time Calculation (min): 19 min  OT Assessment / Plan / Recommendation Comments on Treatment Session Pt. noted with exertional wheezing during ADL practice.  Currently, pt. requiring  Min A overall.  (Wife supportive)    Follow Up Recommendations  Home health OT;Supervision/Assistance - 24 hour    Barriers to Discharge       Equipment Recommendations  Rolling walker with 5" wheels;3 in 1 bedside comode    Recommendations for Other Services    Frequency Min 2X/week   Plan Discharge plan remains appropriate    Precautions / Restrictions Precautions Precautions: Back Precaution Booklet Issued: Yes (comment) Precaution Comments: Pt able to recall 3/3 precautions  Required Braces or Orthoses: Spinal Brace Spinal Brace: Lumbar corset;Applied in sitting position Restrictions Weight Bearing Restrictions: No   Pertinent Vitals/Pain     ADL  Lower Body Dressing: Performed;Min guard (with AE) Where Assessed - Lower Body Dressing: Unsupported sitting;Supported sit to stand Equipment Used: Sock aid;Rolling walker;Reacher;Long-handled sponge;Long-handled shoe horn ADL Comments: Pt able to don/doff socks and pants using AE.   pt. and wife report he has a reacher and LH shoe horn.  They were instructed where they could acquire sock aid and LH sponge.  Also discussed options for shower seats.  Wife thinks they can borrow one.  Pt. with visitor at end of session, and asked to defer remainder of education until tomorrow (Pt with exertional wheezing and edema noted bil. feet)    OT Diagnosis:    OT Problem List:   OT Treatment Interventions:     OT Goals ADL Goals ADL Goal: Lower Body Dressing - Progress: Progressing toward goals Miscellaneous OT Goals OT Goal: Miscellaneous Goal #1 - Progress: Progressing toward goals  Visit Information  Last OT  Received On: 02/12/12 Assistance Needed: +1    Subjective Data      Prior Functioning       Cognition  Overall Cognitive Status: Appears within functional limits for tasks assessed/performed Arousal/Alertness: Awake/alert Orientation Level: Appears intact for tasks assessed Behavior During Session: [facility] for tasks performed    Mobility     Exercises    Balance    End of Session OT - End of Session Equipment Utilized During Treatment: Back brace Activity Tolerance: Patient tolerated treatment well Patient left: in chair;with call bell/phone within reach;with family/visitor present  GO     Brees Hounshell M 02/12/2012, 2:10 PM

## 2012-02-12 NOTE — Progress Notes (Signed)
Physical Therapy Treatment Patient Details Name: Joshua Lopez MRN: [id number] DOB: 02-22-1930 Today's Date: 02/12/2012 Time: 4098-1191 PT Time Calculation (min): 22 min  PT Assessment / Plan / Recommendation Comments on Treatment Session  Pt was reporting 10/10 pain, but hoped that ambulation would help reduce pain.   Pt took frequent rest breaks and 1 seated rest break during ambulation.  Did not attempt stairs due to pt's pain level, but can attempt next session, if pt tolerates .  Pt was slightly SOB and was eager to return to bed after ambulation.  Pt is highly motivated and will benefit from contintued PT to increase strength and functional independence.  Continue per POC.    Follow Up Recommendations  Home health PT;Supervision/Assistance - 24 hour    Barriers to Discharge        Equipment Recommendations  Rolling walker with 5" wheels;3 in 1 bedside comode    Recommendations for Other Services    Frequency Min 5X/week   Plan Discharge plan remains appropriate;Frequency remains appropriate    Precautions / Restrictions Precautions Precautions: Back Precaution Comments: Pt able to recall 3/3 precautions (wife provided visual cues) Required Braces or Orthoses: Spinal Brace Spinal Brace: Lumbar corset;Applied in sitting position Restrictions Weight Bearing Restrictions: No   Pertinent Vitals/Pain 10/10 back pain    Mobility  Bed Mobility Rolling Left: 4: Min assist Sit to Sidelying Right: 4: Min assist Details for Bed Mobility Assistance: VC for proper form and to avoid twisting.  Min assist for LE (lifting to bed and rolling)) Transfers Sit to Stand: 4: Min assist;From toilet;With upper extremity assist;With armrests Stand to Sit: 4: Min assist;To chair/3-in-1;To bed Details for Transfer Assistance: VC for hand placement.  Pt had difficuly extending legs upon standing.   Ambulation/Gait Ambulation/Gait Assistance: 4: Min guard Ambulation Distance (Feet): 350 Feet (One  5-min seated rest break) Assistive device: Rolling walker Ambulation/Gait Assistance Details: VC to reduce weight bearing thru UE and to fully extend trunk.  Pt required several standing rest breaks and 1 seated rest break Gait Pattern: Step-through pattern;Decreased stance time - right;Trunk flexed;Lateral trunk lean to left Gait velocity: decreased gait speed, frequent rest breaks Stairs: No    Exercises     PT Diagnosis:    PT Problem List:   PT Treatment Interventions:     PT Goals Acute Rehab PT Goals PT Goal: Sit to Supine/Side - Progress: Progressing toward goal PT Goal: Sit to Stand - Progress: Progressing toward goal PT Goal: Ambulate - Progress: Progressing toward goal  Visit Information  Last PT Received On: 02/12/12 Assistance Needed: +1    Subjective Data  Subjective: "I can take a lot of pain."   Cognition  Overall Cognitive Status: Appears within functional limits for tasks assessed/performed Arousal/Alertness: Awake/alert Orientation Level: Appears intact for tasks assessed Behavior During Session: [facility] for tasks performed    Balance     End of Session PT - End of Session Equipment Utilized During Treatment: Gait belt;Back brace Activity Tolerance: Patient limited by pain Patient left: in bed;with call bell/phone within reach;with family/visitor present Nurse Communication: Mobility status   GP     Joshua Lopez, SPTA 02/12/2012, 9:10 AM

## 2012-02-12 NOTE — Progress Notes (Signed)
Patient unable to void, bladder scan done and showed more than 999 ml urine in bladder. Patient had a foley cathter placed per policy, foley placed and 1200 cc of urine returned. Patient had already been I&O cath 2 times during the night shift. Md is aware about patient having problems urinating.  Will continue to monitor patient's output closely. Jazzmon Prindle [employer]

## 2012-02-12 NOTE — Progress Notes (Signed)
Seen and agreed 02/12/2012 Robinette, Julia Elizabeth PTA [phone] pager [phone] office    

## 2012-02-12 NOTE — Progress Notes (Signed)
Filed Vitals:   02/12/12 0532 02/12/12 1000 02/12/12 1400 02/12/12 1800  BP: 106/46 112/80 113/66 107/66  Pulse: 80 102 81 87  Temp: 97.6 F (36.4 C) 97.6 F (36.4 C) 97.6 F (36.4 C) 98.5 F (36.9 C)  TempSrc: Oral Oral Oral Oral  Resp: 19 18 18 18   Height:      Weight:      SpO2: 74% 93% 94% 93%     Patient resting in bed. Has been up and ambulating in halls with rolling walker. He is able to don and doff brace on edge of bed. Wound is clean and dry.  Has been having some other difficulties though: Including difficulty voiding and some wheezing.  Patient's been unable to effectively void since the Foley catheter was removed and has required repeated in and out catheterizations. We did check a urinalysis and urine culture 2 days ago: The urinalysis was unremarkable, and the culture showed no growth as the final result. Notably the patient was on Flomax prior to admission, and was continued on admission. He is followed by Dr. Barron Alvine as his urologist. Because of the repeated requirement for in and out catheterizations, per the hospital protocol, a Foley catheter was placed.  The patient's wife also notes that he has been wheezing and has some swelling of his ankles, and when I auscultate his lung fields he has some wheezing on expiration. He does take a small dose of HCTZ (12.5 mg) daily, and this was also continued on admission.   Plan: As regards his recovery from surgery, he is doing well. He is having other difficulties and we will evaluate those further.  We will obtain another urinalysis and urine culture now, because of the repeated and out catheterizations and the possibility of the urine now being infected.  We will also request a PA and lateral chest x-ray, BMET and CBC with differential now, and a repeat BMET in a.m. We will give a dose of Lasix 20 mg by mouth now.  I've spoken with the patient and his wife regarding his condition and our plans for treatment, and I  also discussed it with his nurse.   Hewitt Shorts, MD 02/12/2012, 6:57 PM

## 2012-02-13 LAB — BASIC METABOLIC PANEL
BUN: 20 mg/dL (ref 6–23)
CO2: 28 mEq/L (ref 19–32)
Calcium: 8.5 mg/dL (ref 8.4–10.5)
Chloride: 97 mEq/L (ref 96–112)
Creatinine, Ser: 1.27 mg/dL (ref 0.50–1.35)
GFR calc Af Amer: 60 mL/min — ABNORMAL LOW (ref >90)
GFR calc non Af Amer: 52 mL/min — ABNORMAL LOW (ref >90)
Glucose, Bld: 125 mg/dL — ABNORMAL HIGH (ref 70–99)
Potassium: 4.7 mEq/L (ref 3.5–5.1)
Sodium: 135 mEq/L (ref 135–145)

## 2012-02-13 LAB — URINE CULTURE
Colony Count: NO GROWTH
Culture: NO GROWTH

## 2012-02-13 MED ORDER — HYDROCHLOROTHIAZIDE 12.5 MG PO CAPS
12.5000 mg | ORAL_CAPSULE | Freq: Every day | ORAL | Status: DC
  Administered 2012-02-13 – 2012-02-14 (×2): 12.5 mg via ORAL
  Filled 2012-02-13 (×2): qty 1

## 2012-02-13 NOTE — Progress Notes (Signed)
Subjective: Patient seen initially resting in bed, we then had a sudden the side of the bed and examined him, and now he's up and exiting in the halls with physical therapy, using a rolling walker. They're plan working on stairs. Patient had a large diuresis in response to Lasix 20 mg by mouth (4000 cc). Chest x-ray (PA and lateral) last night was clear. Urinalysis showed small amount of WBC as well as RBC, possibly due to trauma from placement of Foley, not clearly indicative of a UTI.  Objective: Vital signs in last 24 hours: Filed Vitals:   02/12/12 1400 02/12/12 1800 02/12/12 2026 02/13/12 0120  BP: 113/66 107/66 106/59 110/64  Pulse: 81 87 91 91  Temp: 97.6 F (36.4 C) 98.5 F (36.9 C) 98.1 F (36.7 C) 98 F (36.7 C)  TempSrc: Oral Oral Oral Oral  Resp: 18 18 18 18   Height:      Weight:      SpO2: 94% 93% 93% 94%    Intake/Output from previous day: 07/22 0701 - 07/23 0700 In: -  Out: 5200 [Urine:5200] Intake/Output this shift:    Physical Exam:  Wound is clean and dry, healing well. Lungs no clear to auscultation, wheezing resolved.  CBC  Basename 02/12/12 1853  WBC 5.9  HGB 10.3*  HCT 28.9*  PLT 161   BMET  Basename 02/13/12 0626 02/12/12 1853  NA 135 131*  K 4.7 5.2*  CL 97 93*  CO2 28 29  GLUCOSE 125* 160*  BUN 20 23  CREATININE 1.27 1.34  CALCIUM 8.5 8.4    Studies/Results: Dg Chest 2 View  02/12/2012  *RADIOLOGY REPORT*  Clinical Data: Wheezing and chest pain  CHEST - 2 VIEW  Comparison: 09/10/2011  Findings: Heart size and vascularity are normal.  Negative for pneumonia.  Lungs are clear without infiltrate or effusion. Surgical clips in the region of the right thyroid.  Right shoulder replacement.  IMPRESSION: No acute cardiopulmonary disease.  Original Report Authenticated By: Camelia Phenes, M.D.    Assessment/Plan: Improved from yesterday. Continuing PT and OT. We'll discuss case with his urologist Dr. Barron Alvine, but may well need to go home  with a Foley catheter and the leg bag.   Hewitt Shorts, MD 02/13/2012, 8:28 AM

## 2012-02-13 NOTE — Progress Notes (Signed)
Seen and agreed 02/13/2012 Lajuan Godbee Elizabeth PTA [phone] pager [phone] office    

## 2012-02-13 NOTE — Care Management Note (Signed)
    Page 1 of 2   02/15/2012     10:17:33 AM   CARE MANAGEMENT NOTE 02/15/2012  Patient:  Joshua Lopez, Joshua Lopez   Account Number:  [id number]  Date Initiated:  02/13/2012  Documentation initiated by:  Onnie Boer  Subjective/Objective Assessment:   PT WAS ADMITTED FOR BACK SURGERY     Action/Plan:   PROGRESSION OF CARE AND DISCHARGE PLANNING   Anticipated DC Date:  02/14/2012   Anticipated DC Plan:  HOME W HOME HEALTH SERVICES      DC Planning Services  CM consult      Choice offered to / List presented to:  C-3 Spouse        HH arranged  HH-2 PT  HH-3 OT      [facility] agency  [facility]   Status of service:  Completed, signed off Medicare Important Message given?   (If response is "NO", the following Medicare IM given date fields will be blank) Date Medicare IM given:   Date Additional Medicare IM given:    Discharge Disposition:  HOME W HOME HEALTH SERVICES  Per UR Regulation:  Reviewed for med. necessity/level of care/duration of stay  If discussed at Long Length of Stay Meetings, dates discussed:    Comments:  02/14/12 Onnie Boer, RN, BSN 1430 PT MAY DC TO HOME WITH HH.  NO ORDERS HAVE BEEN GIVEN IF PT IS TO NEED HH PT/OT THEY WOULD LIKE TO USE [facility].  PT AND HIS SPOUSE HAS STATED THAT HE DOESNT NEED A RW OR A 3N1.  02/13/12 Onnie Boer, RN, BSN 1457 PT WAS ADMITTED FOR BACK SURGERY.  PTA PT WAS AT HOME WITH HIS WIFE.  PT WILL NEED HH PT/OT AND A 3N1.  FAMILY WAS GOING TO DO SOME HOMEWORK ON THE AGENCIES.  WILL F/U ONCE ORDERS ARE COMPLETE.

## 2012-02-13 NOTE — Progress Notes (Signed)
Physical Therapy Treatment Patient Details Name: Joshua Lopez MRN: [id number] DOB: March 18, 1930 Today's Date: 02/13/2012 Time: 1478-2956 PT Time Calculation (min): 22 min  PT Assessment / Plan / Recommendation Comments on Treatment Session  Pt was able to ascend/descend stairs today and was able to recall proper form for backwards up stairs with RW.  Pt requires frequent VC to reduce weight-bearing in UE and extend trunk. Wife is helpful and also able to recall and demonstrate precautions.    Pt will benefit from continued PT to increase strength and endurance.        Follow Up Recommendations  Home health PT;Supervision/Assistance - 24 hour    Barriers to Discharge        Equipment Recommendations  Rolling walker with 5" wheels;3 in 1 bedside comode    Recommendations for Other Services    Frequency Min 5X/week   Plan Discharge plan remains appropriate;Frequency remains appropriate    Precautions / Restrictions Precautions Precautions: Back Precaution Booklet Issued: Yes (comment) (Walker/Stairs and BAT handout) Precaution Comments: Pt and wife able to recall/demo 3/3 precautions Required Braces or Orthoses: Spinal Brace Spinal Brace: Lumbar corset;Applied in sitting position Restrictions Weight Bearing Restrictions: No   Pertinent Vitals/Pain     Mobility  Bed Mobility Rolling Right: 4: Min guard Right Sidelying to Sit: 4: Min guard;With rails;HOB flat Details for Bed Mobility Assistance: VC for proper hand placement, wife assisting with VC and to move LE off bed. Transfers Sit to Stand: From bed;From chair/3-in-1;4: Min guard Stand to Sit: 4: Min guard;To chair/3-in-1;With upper extremity assist Details for Transfer Assistance: Min guard for safety, pt extends slowly and complains of pain as he stands up.  Pt req VC to slow descent into chair Ambulation/Gait Ambulation/Gait Assistance: 4: Min guard Ambulation Distance (Feet): 350 Feet (one seated break) Assistive  device: Rolling walker Ambulation/Gait Assistance Details: Pt requires VC to look forward, extend trunk, and to reduce weight bearing on UE.   Gait Pattern: Step-through pattern;Decreased stride length;Trunk flexed Gait velocity: decreased gait speed Stairs: Yes Stairs Assistance: 4: Min assist Stairs Assistance Details (indicate cue type and reason): Pt able to demonstrate proper form for backwards up stairs with RW. Stair Management Technique: With walker;Backwards Number of Stairs: 5     Exercises     PT Diagnosis:    PT Problem List:   PT Treatment Interventions:     PT Goals Acute Rehab PT Goals PT Goal: Rolling Supine to Right Side - Progress: Progressing toward goal PT Goal: Supine/Side to Sit - Progress: Progressing toward goal PT Goal: Sit to Stand - Progress: Progressing toward goal PT Goal: Ambulate - Progress: Progressing toward goal PT Goal: Up/Down Stairs - Progress: Progressing toward goal  Visit Information  Last PT Received On: 02/13/12 Assistance Needed: +1    Subjective Data  Subjective: "I am feeling much better today.  My catheter is bothering me, that is the only thing."   Cognition  Overall Cognitive Status: Appears within functional limits for tasks assessed/performed Arousal/Alertness: Awake/alert Orientation Level: Appears intact for tasks assessed Behavior During Session: [facility] for tasks performed    Balance     End of Session PT - End of Session Equipment Utilized During Treatment: Gait belt;Back brace Activity Tolerance: Patient tolerated treatment well Patient left: in chair;with call bell/phone within reach;with family/visitor present Nurse Communication: Mobility status   GP     Lyndsie Wallman, SPTA 02/13/2012, 9:22 AM

## 2012-02-13 NOTE — Progress Notes (Signed)
Occupational Therapy Treatment Patient Details Name: Joshua Lopez MRN: [id number] DOB: 03/29/1930 Today's Date: 02/13/2012 Time: 1010-1029 OT Time Calculation (min): 19 min  OT Assessment / Plan / Recommendation Comments on Treatment Session Pt noted to have DOE; educated pt on pursed lip breathing technique which improved breathing. Also, encouraged OOB activities and incentive spirometer.    Follow Up Recommendations  Home health OT;Supervision/Assistance - 24 hour    Barriers to Discharge       Equipment Recommendations  Rolling walker with 5" wheels    Recommendations for Other Services    Frequency Min 2X/week   Plan Discharge plan remains appropriate    Precautions / Restrictions Precautions Precautions: Back Precaution Comments: Pt and wife able to recall/demo 3/3 precautions Required Braces or Orthoses: Spinal Brace Spinal Brace: Lumbar corset;Applied in sitting position Restrictions Weight Bearing Restrictions: No   Pertinent Vitals/Pain Pt reports 6/10 back pain; RN informed and pt repositioned    ADL  Grooming: Performed;Min guard Where Assessed - Grooming: Unsupported standing Toilet Transfer: Performed;Minimal assistance Toilet Transfer Method: Sit to stand Toilet Transfer Equipment: Regular height toilet;Grab bars Toileting - Clothing Manipulation and Hygiene: Performed;Min guard Where Assessed - Engineer, mining and Hygiene: Standing Tub/Shower Transfer: Paramedic Method: Ambulating Equipment Used: Back brace;Gait belt;Rolling walker;Reacher Transfers/Ambulation Related to ADLs: ambulated with Min guard A; Min HHA for walk-in shower transfer simulating pt holding to shower frame and wife providing supervision ADL Comments: Re-educated pt and wife on use of AE for LB dsg and bathing; also educated where to purchase equipment. Pt's wife states she will assist with socks and shoes as necessary.  wife  states she has been and will continue to provide back peri-care Pt prefers to lie on side in bed; educated on placement of pillows for alignment and comfort   OT Diagnosis:    OT Problem List:   OT Treatment Interventions:     OT Goals ADL Goals ADL Goal: Grooming - Progress: Progressing toward goals ADL Goal: Toilet Transfer - Progress: Progressing toward goals ADL Goal: Toileting - Clothing Manipulation - Progress: Progressing toward goals ADL Goal: Toileting - Hygiene - Progress: Progressing toward goals Miscellaneous OT Goals OT Goal: Miscellaneous Goal #1 - Progress: Progressing toward goals OT Goal: Miscellaneous Goal #2 - Progress: Progressing toward goals  Visit Information  Last OT Received On: 02/13/12 Assistance Needed: +1    Subjective Data      Prior Functioning       Cognition  Overall Cognitive Status: Appears within functional limits for tasks assessed/performed Arousal/Alertness: Awake/alert Orientation Level: Appears intact for tasks assessed Behavior During Session: [facility] for tasks performed    Mobility Bed Mobility Rolling Right: 4: Min guard Right Sidelying to Sit: 4: Min assist Sit to Sidelying Right: 4: Min assist;HOB flat Details for Bed Mobility Assistance: VC for sequencing. wife assisted with bring LE off of bed and back onto bed Transfers Sit to Stand: 4: Min guard;From bed;With upper extremity assist Stand to Sit: 4: Min guard;To bed;With upper extremity assist Details for Transfer Assistance: Min guard for safety, pt extends slowly and complains of pain as he stands up.  Pt req VC to slow descent onto bed   Exercises    Balance    End of Session OT - End of Session Equipment Utilized During Treatment: Back brace Activity Tolerance: Patient tolerated treatment well Patient left: with call bell/phone within reach;in bed;with family/visitor present  GO     Davonna Ertl 02/13/2012, 2:30 PM

## 2012-02-14 MED ORDER — HYDROCODONE-ACETAMINOPHEN 5-325 MG PO TABS: 1.0000 | ORAL_TABLET | ORAL | Status: AC | PRN

## 2012-02-14 NOTE — Progress Notes (Signed)
Occupational Therapy Treatment Patient Details Name: Joshua Lopez MRN: [id number] DOB: 1929-08-25 Today's Date: 02/14/2012 Time: 7829-5621 OT Time Calculation (min): 26 min  OT Assessment / Plan / Recommendation Comments on Treatment Session Pt's DOE appears improved today    Follow Up Recommendations  No OT follow up;Supervision/Assistance - 24 hour    Barriers to Discharge       Equipment Recommendations  Rolling walker with 5" wheels;None recommended by OT    Recommendations for Other Services    Frequency     Plan Discharge plan needs to be updated    Precautions / Restrictions Precautions Precautions: Back Precaution Comments: pt and wife able verbalize and generalize 3/3 back precautions during session; pt able to don brace with Min A from wife Required Braces or Orthoses: Spinal Brace Spinal Brace: Lumbar corset;Applied in sitting position Restrictions Weight Bearing Restrictions: No   Pertinent Vitals/Pain Pt reports 5/10 back pain; Pre-medicated and repositioned    ADL  Grooming: Performed;Supervision/safety;Wash/dry face Where Assessed - Grooming: Unsupported standing Upper Body Bathing: Simulated;Set up;Supervision/safety Where Assessed - Upper Body Bathing: Unsupported sitting Lower Body Bathing: Simulated;Minimal assistance Where Assessed - Lower Body Bathing: Unsupported sit to stand Lower Body Dressing: Performed;+1 Total assistance (doff/don socks and house shoes by wife ) Toilet Transfer: Performed;Min guard Statistician Method: Sit to Barista: Regular height toilet;Grab bars Toileting - Clothing Manipulation and Hygiene: Performed;Min guard (with clothing manip; wife performed hyg total assist) Where Assessed - Toileting Clothing Manipulation and Hygiene: Sit on 3-in-1 or toilet Equipment Used: Back brace;Gait belt;Rolling walker Transfers/Ambulation Related to ADLs: ambulated Min guard A/Supervision with RW ADL Comments:  Pt able to recall correctly use of AE for LB dsg  Pt noted to have swelling in bil LE; elevated LE's in chair and educated on edema control  OT Diagnosis:    OT Problem List:   OT Treatment Interventions:     OT Goals ADL Goals ADL Goal: Grooming - Progress: Met ADL Goal: Lower Body Bathing - Progress: Progressing toward goals ADL Goal: Lower Body Dressing - Progress: Progressing toward goals ADL Goal: Toilet Transfer - Progress: Met ADL Goal: Toileting - Clothing Manipulation - Progress: Met ADL Goal: Toileting - Hygiene - Progress: Progressing toward goals Miscellaneous OT Goals OT Goal: Miscellaneous Goal #1 - Progress: Progressing toward goals OT Goal: Miscellaneous Goal #2 - Progress: Progressing toward goals  Visit Information  Last OT Received On: 02/14/12 Assistance Needed: +1    Subjective Data      Prior Functioning       Cognition  Overall Cognitive Status: Appears within functional limits for tasks assessed/performed Arousal/Alertness: Awake/alert Orientation Level: Appears intact for tasks assessed Behavior During Session: [facility] for tasks performed    Mobility Bed Mobility Right Sidelying to Sit: 4: Min guard;HOB flat Details for Bed Mobility Assistance: Pt encouraged to perform bed mobility as I'ly as possible to lessen the likelihood of his wife getting injured while assisting him Transfers Sit to Stand: 5: Supervision;From bed;With upper extremity assist Stand to Sit: 5: Supervision;With armrests;To toilet Details for Transfer Assistance: Supervision provided by wife for sit to stand from toilet; initial VC for hand placement for sit to stand    Exercises    Balance    End of Session OT - End of Session Equipment Utilized During Treatment: Back brace;Gait belt Activity Tolerance: Patient tolerated treatment well Patient left: in chair;with call bell/phone within reach;with family/visitor present  GO     Mary-Anne Polizzi 02/14/2012, 3:43 PM

## 2012-02-14 NOTE — Progress Notes (Signed)
I have read and agree with below treatment note and plan.  Jaylyne Breese Helen Whitlow PT, DPT Pager: [phone] 

## 2012-02-14 NOTE — Discharge Summary (Signed)
Physician Discharge Summary  Patient ID: Joshua Lopez MRN: [id number] DOB/AGE: 1929/08/11 78 y.o.  Admit date: 02/08/2012 Discharge date: 02/14/2012  Admission Diagnoses: Lumbar spondylolisthesis, lumbar stenosis, lumbar spondylosis, lumbar degenerative disc disease, neurogenic claudication  Discharge Diagnoses: Lumbar spondylolisthesis, lumbar stenosis, lumbar spondylosis, lumbar degenerative disc disease, neurogenic claudication, urinary retention  Discharged Condition: good  Hospital Course: Patient was admitted and underwent an L2-L4 decompressive lumbar laminectomy, a bilateral L2-3 and L3-4 PLIF and PLA. Postoperatively he has done well. He was seen by PT and OT to work with him on transfers, ambulation, and ADLs. They've continue to work with him throughout his postoperative course. He had a Foley catheter placed for surgery, he was discontinued postoperatively, but he had difficulty voiding. He has a history of BPH and is followed by Dr. Barron Alvine and was on Flomax prior to admission, and it was continued postoperatively. After a number of voiding trials, and after having required multiple and out catheterizations, a Foley catheter was placed once again. I spoke with Dr. Isabel Caprice by phone and he recommended that the patient be discharged with the Foley catheter, with a leg bag, and a followup with him in the office, and he explained that his office staff will contact the patient and his wife to arrange for followup either later this week or early next week.  His wound is healing well. He is up and living in the halls, multiple times each day, using a rolling walker. He has a rolling walker at home. PT and OT did recommend home health PT and OT and this was ordered per their recommendations  He is to return for followup with me in 3 weeks, my staff will arrange for x-rays to be done at the office that day. He and his wife have been given instructions regarding wound care and activities  following discharge.  Discharge Exam: Blood pressure 144/119, pulse 62, temperature 98.4 F (36.9 C), temperature source Oral, resp. rate 18, height 5\' 9"  (1.753 m), weight 92.647 kg (204 lb 4 oz), SpO2 98.00%.  Disposition: Home   Medication List  As of 02/14/2012  7:19 PM   TAKE these medications         amLODipine 5 MG tablet   Commonly known as: NORVASC   Take 5 mg by mouth every morning.      amLODipine 5 MG tablet   Commonly known as: NORVASC   TAKE ONE TABLET BY MOUTH EVERY DAY      amoxicillin 500 MG capsule   Commonly known as: AMOXIL   Take 2,000 mg by mouth once as needed. One hour before dental appointment      CALTRATE 600+D PO   Take 1 tablet by mouth 2 (two) times daily.      Cyanocobalamin 2500 MCG Tabs   Take 1,250 mcg by mouth every morning.      diclofenac 1.3 % Ptch   Commonly known as: FLECTOR   Place 0.5 patches onto the skin daily as needed. For back pain      docusate sodium 100 MG capsule   Commonly known as: COLACE   Take 100 mg by mouth 2 (two) times daily.      fish oil-omega-3 fatty acids 1000 MG capsule   Take 1 g by mouth 2 (two) times daily.      HYDROcodone-acetaminophen 5-325 MG per tablet   Commonly known as: NORCO/VICODIN   Take 1-2 tablets by mouth every 4 (four) hours as needed for pain.  [name] 145 MCG Caps   Generic drug: Linaclotide   Take 145 mcg by mouth at bedtime.      losartan-hydrochlorothiazide 100-12.5 MG per tablet   Commonly known as: HYZAAR   Take 1 tablet by mouth every morning.      montelukast 10 MG tablet   Commonly known as: SINGULAIR   Take 10 mg by mouth at bedtime.      pantoprazole 40 MG tablet   Commonly known as: PROTONIX   Take 40 mg by mouth daily before breakfast. 30 minutes before a meal      polyethylene glycol packet   Commonly known as: MIRALAX / GLYCOLAX   Take 17 g by mouth 2 (two) times daily.      pravastatin 20 MG tablet   Commonly known as: PRAVACHOL   Take 20 mg by mouth  at bedtime.      SAW PALMETTO (SERENOA REPENS) PO   Take 1 tablet by mouth every morning.      Tamsulosin HCl 0.4 MG Caps   Commonly known as: FLOMAX   Take 0.4 mg by mouth at bedtime.      traMADol 50 MG tablet   Commonly known as: ULTRAM   Take 50 mg by mouth every 6 (six) hours as needed. For back pain      triamcinolone cream 0.1 %   Commonly known as: KENALOG   Apply 1 application topically daily as needed. For itchy skin             Signed: Hewitt Shorts, MD 02/14/2012, 7:19 PM

## 2012-02-14 NOTE — Progress Notes (Signed)
Physical Therapy Treatment Patient Details Name: Joshua Lopez MRN: [id number] DOB: 1929/08/13 Today's Date: 02/14/2012 Time: 0102-7253 PT Time Calculation (min): 19 min  PT Assessment / Plan / Recommendation Comments on Treatment Session  Pt performed ascend/descend stairs using RW to ascend stairs backward and wife stabalizing RW from the front. During ambulation pt required verbal cueing to maintain upright posture and remain close to RW. Pt tolerated session well and is possibly discharging this afternoon.    Follow Up Recommendations  Home health PT;Supervision/Assistance - 24 hour    Barriers to Discharge        Equipment Recommendations  Rolling walker with 5" wheels    Recommendations for Other Services    Frequency Min 5X/week   Plan Discharge plan remains appropriate;Frequency remains appropriate    Precautions / Restrictions Precautions Precautions: Back Precaution Comments: pt and wife able verbalize and generalize 3/3 back precautions during session; pt able to don brace with Min A from wife Required Braces or Orthoses: Spinal Brace Spinal Brace: Lumbar corset;Applied in sitting position Restrictions Weight Bearing Restrictions: No       Mobility  Bed Mobility Details for Bed Mobility Assistance: Pt was seated on toilet upon presentation. Had pt talk through the steps of getting back into and out of bed and pt recalled with minimum verbal cueing. Transfers Transfers: Sit to Stand;Stand to Sit Sit to Stand: With upper extremity assist;From toilet;4: Min guard (wife assisting pt from toilet) Stand to Sit: With upper extremity assist;To chair/3-in-1;4: Min guard Details for Transfer Assistance: Min guard for safety-wife was assisting pt from toilet. Min guard for stand -> sit for safety. Pt required minimal verbal cues to reach back for the chair before sitting. Ambulation/Gait Ambulation/Gait Assistance: 5: Supervision Ambulation Distance (Feet): 400  Feet Assistive device: Rolling walker Ambulation/Gait Assistance Details: Pt required verbal cues for upright posture and to reduce weight bearing on bil UE on RW. Gait Pattern: Trunk flexed;Decreased stride length Stairs: Yes Stairs Assistance: 5: Supervision;4: Min guard Stairs Assistance Details (indicate cue type and reason): Pt able to verbalize and demonstrate proper technique for ascending stairs backward and descending stairs forward using RW. Wife was in front of pt stabalizing walker. SPT behind for min guard that progressed to supervision due to pt proper technique. Stair Management Technique: Backwards;With walker Number of Stairs: 6       PT Goals Acute Rehab PT Goals PT Goal: Sit to Stand - Progress: Progressing toward goal PT Goal: Ambulate - Progress: Met PT Goal: Up/Down Stairs - Progress: Progressing toward goal  Visit Information  Last PT Received On: 02/14/12 Assistance Needed: +1    Subjective Data  Subjective: "I am ready to go home today."   Cognition  Overall Cognitive Status: Appears within functional limits for tasks assessed/performed Arousal/Alertness: Awake/alert Orientation Level: Appears intact for tasks assessed Behavior During Session: [facility] for tasks performed    Balance     End of Session PT - End of Session Equipment Utilized During Treatment: Gait belt;Back brace Activity Tolerance: Patient tolerated treatment well Patient left: in chair;with call bell/phone within reach;with family/visitor present Nurse Communication: Mobility status   GP     Joshua Lopez 02/14/2012, 11:45 AM

## 2012-02-15 MED FILL — Polyethylene Glycol 3350 Oral Packet 17 GM: ORAL | Qty: 1 | Status: AC

## 2012-02-15 MED FILL — Hydrocodone-Acetaminophen Tab 5-325 MG: ORAL | Qty: 2 | Status: AC

## 2012-02-15 MED FILL — Montelukast Sodium Tab 10 MG (Base Equiv): ORAL | Qty: 1 | Status: AC

## 2012-02-15 MED FILL — Simvastatin Tab 10 MG: ORAL | Qty: 100 | Status: AC

## 2012-02-15 MED FILL — Tamsulosin HCl Cap 0.4 MG: ORAL | Qty: 1 | Status: AC

## 2012-02-15 MED FILL — Docusate Sodium Cap 100 MG: ORAL | Qty: 1 | Status: AC

## 2012-02-19 DIAGNOSIS — Z4789 Encounter for other orthopedic aftercare: Secondary | ICD-10-CM | POA: Diagnosis not present

## 2012-02-19 DIAGNOSIS — R269 Unspecified abnormalities of gait and mobility: Secondary | ICD-10-CM | POA: Diagnosis not present

## 2012-02-19 DIAGNOSIS — I10 Essential (primary) hypertension: Secondary | ICD-10-CM | POA: Diagnosis not present

## 2012-02-19 DIAGNOSIS — IMO0001 Reserved for inherently not codable concepts without codable children: Secondary | ICD-10-CM | POA: Diagnosis not present

## 2012-02-19 DIAGNOSIS — M6281 Muscle weakness (generalized): Secondary | ICD-10-CM | POA: Diagnosis not present

## 2012-02-19 DIAGNOSIS — R339 Retention of urine, unspecified: Secondary | ICD-10-CM | POA: Diagnosis not present

## 2012-02-21 ENCOUNTER — Telehealth: Payer: Self-pay | Admitting: Internal Medicine

## 2012-02-21 DIAGNOSIS — Z4789 Encounter for other orthopedic aftercare: Secondary | ICD-10-CM | POA: Diagnosis not present

## 2012-02-21 DIAGNOSIS — M6281 Muscle weakness (generalized): Secondary | ICD-10-CM | POA: Diagnosis not present

## 2012-02-21 DIAGNOSIS — I10 Essential (primary) hypertension: Secondary | ICD-10-CM | POA: Diagnosis not present

## 2012-02-21 DIAGNOSIS — R269 Unspecified abnormalities of gait and mobility: Secondary | ICD-10-CM | POA: Diagnosis not present

## 2012-02-21 DIAGNOSIS — IMO0001 Reserved for inherently not codable concepts without codable children: Secondary | ICD-10-CM | POA: Diagnosis not present

## 2012-02-21 NOTE — Telephone Encounter (Signed)
Caller: Faye/Spouse; PCP: Marga Melnick; CB#: [phone];WGNF regarding Has Had Trouble Voiding and Had Surgery On 07/18 - L2-4 fusion.  Has foley catheter in.  She had not had any contact with urology until she got callback on 7/31.  She states that her questions have been answered now and she has no other concerns at this time.    Caller: Faye/Spouse; PCP: Marga Melnick; CB#: [phone];IONG regarding Has Had  Trouble Voiding and Had Surgery On 07/18 - L2-4 fusion.  Has foley catheter in.  She had not  had any contact with urology until she got callback on 7/31.  She states that her questions have  been answered now and she has no other concerns at this time.

## 2012-02-23 DIAGNOSIS — IMO0001 Reserved for inherently not codable concepts without codable children: Secondary | ICD-10-CM | POA: Diagnosis not present

## 2012-02-23 DIAGNOSIS — R269 Unspecified abnormalities of gait and mobility: Secondary | ICD-10-CM | POA: Diagnosis not present

## 2012-02-23 DIAGNOSIS — I10 Essential (primary) hypertension: Secondary | ICD-10-CM | POA: Diagnosis not present

## 2012-02-23 DIAGNOSIS — Z4789 Encounter for other orthopedic aftercare: Secondary | ICD-10-CM | POA: Diagnosis not present

## 2012-02-23 DIAGNOSIS — M6281 Muscle weakness (generalized): Secondary | ICD-10-CM | POA: Diagnosis not present

## 2012-02-26 ENCOUNTER — Telehealth: Payer: Self-pay | Admitting: Gastroenterology

## 2012-02-26 DIAGNOSIS — R339 Retention of urine, unspecified: Secondary | ICD-10-CM | POA: Diagnosis not present

## 2012-02-26 DIAGNOSIS — N320 Bladder-neck obstruction: Secondary | ICD-10-CM | POA: Diagnosis not present

## 2012-02-26 MED ORDER — LINACLOTIDE 145 MCG PO CAPS: 145.0000 ug | ORAL_CAPSULE | Freq: Every day | ORAL | Status: DC

## 2012-02-26 NOTE — Telephone Encounter (Signed)
rx sent

## 2012-02-27 DIAGNOSIS — M6281 Muscle weakness (generalized): Secondary | ICD-10-CM | POA: Diagnosis not present

## 2012-02-27 DIAGNOSIS — R269 Unspecified abnormalities of gait and mobility: Secondary | ICD-10-CM | POA: Diagnosis not present

## 2012-02-27 DIAGNOSIS — Z4789 Encounter for other orthopedic aftercare: Secondary | ICD-10-CM | POA: Diagnosis not present

## 2012-02-27 DIAGNOSIS — IMO0001 Reserved for inherently not codable concepts without codable children: Secondary | ICD-10-CM | POA: Diagnosis not present

## 2012-02-27 DIAGNOSIS — I10 Essential (primary) hypertension: Secondary | ICD-10-CM | POA: Diagnosis not present

## 2012-02-28 ENCOUNTER — Telehealth: Payer: Self-pay | Admitting: *Deleted

## 2012-02-28 ENCOUNTER — Telehealth: Payer: Self-pay | Admitting: Gastroenterology

## 2012-02-28 MED ORDER — LUBIPROSTONE 8 MCG PO CAPS: 8.0000 ug | ORAL_CAPSULE | Freq: Two times a day (BID) | ORAL | Status: AC

## 2012-02-28 NOTE — Telephone Encounter (Signed)
Patient's insurance company denied linzess apparently. Per Dr Jarold Motto, he may try [name] 8 mcg twice daily. New rx sent and patient's wife advised.

## 2012-02-28 NOTE — Telephone Encounter (Signed)
Pt wife advised that rx is at [facility]. However, a prior authorization mus be completed for insurance to cover the prescription. Wife verbalizes understanding and Prior Berkley [name] has been initiated.

## 2012-02-28 NOTE — Telephone Encounter (Signed)
Dr Joshua Lopez, insurance has now denied amitiza and linzess. I advised them that he has tried Miralax, colace, fleets enemas. However, they still require patient to use lactulose rather than amitiza or linzess. Do you want me to give linzess and if so, what are the directions?

## 2012-02-28 NOTE — Telephone Encounter (Signed)
Patient's wife states that she checked with [facility]) for a prescription for [name] yesterday and they did not have one.  She wants to know if Dr. Jarold Motto wants her husband to stay on this medicine.

## 2012-02-29 ENCOUNTER — Other Ambulatory Visit: Payer: Self-pay | Admitting: *Deleted

## 2012-02-29 DIAGNOSIS — I10 Essential (primary) hypertension: Secondary | ICD-10-CM | POA: Diagnosis not present

## 2012-02-29 DIAGNOSIS — R269 Unspecified abnormalities of gait and mobility: Secondary | ICD-10-CM | POA: Diagnosis not present

## 2012-02-29 DIAGNOSIS — IMO0001 Reserved for inherently not codable concepts without codable children: Secondary | ICD-10-CM | POA: Diagnosis not present

## 2012-02-29 DIAGNOSIS — Z4789 Encounter for other orthopedic aftercare: Secondary | ICD-10-CM | POA: Diagnosis not present

## 2012-02-29 DIAGNOSIS — M6281 Muscle weakness (generalized): Secondary | ICD-10-CM | POA: Diagnosis not present

## 2012-02-29 MED ORDER — LINACLOTIDE 145 MCG PO CAPS: 145.0000 ug | ORAL_CAPSULE | Freq: Every day | ORAL | Status: DC

## 2012-02-29 MED FILL — Linaclotide Cap 145 MCG: ORAL | Qty: 1 | Status: AC

## 2012-02-29 NOTE — Telephone Encounter (Signed)
I guess he needs to pay for his medicine !!!!

## 2012-02-29 NOTE — Addendum Note (Signed)
Addended by: Richardson Chiquito on: 02/29/2012 09:28 AM   Modules accepted: Orders

## 2012-02-29 NOTE — Telephone Encounter (Signed)
I have advised patient's wife that insurance will only cover lactulose. Dr Jarold Motto does not recommend this. He recommends [name] or [name]. However, they will have to pay out of pocket for these medications. I have advised that if they cannot afford out of pocket expense, we may have to revert back to miralax. She verbalizes understanding. I have also advised that I will place limited samples of [name] at the front desk for her to pick up for her husband.

## 2012-03-01 DIAGNOSIS — M4310 Spondylolisthesis, site unspecified: Secondary | ICD-10-CM | POA: Diagnosis not present

## 2012-03-04 DIAGNOSIS — Z4789 Encounter for other orthopedic aftercare: Secondary | ICD-10-CM | POA: Diagnosis not present

## 2012-03-04 DIAGNOSIS — M6281 Muscle weakness (generalized): Secondary | ICD-10-CM | POA: Diagnosis not present

## 2012-03-04 DIAGNOSIS — IMO0001 Reserved for inherently not codable concepts without codable children: Secondary | ICD-10-CM | POA: Diagnosis not present

## 2012-03-04 DIAGNOSIS — R269 Unspecified abnormalities of gait and mobility: Secondary | ICD-10-CM | POA: Diagnosis not present

## 2012-03-04 DIAGNOSIS — R339 Retention of urine, unspecified: Secondary | ICD-10-CM | POA: Diagnosis not present

## 2012-03-04 DIAGNOSIS — I10 Essential (primary) hypertension: Secondary | ICD-10-CM | POA: Diagnosis not present

## 2012-03-04 DIAGNOSIS — R350 Frequency of micturition: Secondary | ICD-10-CM | POA: Diagnosis not present

## 2012-03-04 DIAGNOSIS — R300 Dysuria: Secondary | ICD-10-CM | POA: Diagnosis not present

## 2012-03-04 DIAGNOSIS — N400 Enlarged prostate without lower urinary tract symptoms: Secondary | ICD-10-CM | POA: Diagnosis not present

## 2012-03-26 DIAGNOSIS — R339 Retention of urine, unspecified: Secondary | ICD-10-CM | POA: Diagnosis not present

## 2012-03-26 DIAGNOSIS — N401 Enlarged prostate with lower urinary tract symptoms: Secondary | ICD-10-CM | POA: Diagnosis not present

## 2012-03-26 DIAGNOSIS — N138 Other obstructive and reflux uropathy: Secondary | ICD-10-CM | POA: Diagnosis not present

## 2012-04-11 ENCOUNTER — Other Ambulatory Visit: Payer: Self-pay | Admitting: Internal Medicine

## 2012-04-11 NOTE — Telephone Encounter (Signed)
LIPID/HEP [id number]  

## 2012-04-12 ENCOUNTER — Telehealth: Payer: Self-pay | Admitting: Internal Medicine

## 2012-04-12 MED ORDER — PRAVASTATIN SODIUM 20 MG PO TABS: ORAL_TABLET | ORAL | Status: DC

## 2012-04-12 NOTE — Telephone Encounter (Signed)
#  [phone] [id number] 

## 2012-04-12 NOTE — Telephone Encounter (Signed)
Dr.Hopper please advise, last lipid check was 2011. I approved 30 day supply (pt with lab appointment Monday). Patient would like a 90 day supply instead of 30

## 2012-04-12 NOTE — Telephone Encounter (Signed)
RX resubmitted

## 2012-04-12 NOTE — Telephone Encounter (Signed)
made lab appt for Labs now wants a 90-day supply of pravastatin, due to insurance  appt is for Monday at 8am, wife would like to know if RX can be changed now or if he needs to wait on labs? Please advive call back # [phone] Also not pt has my chart

## 2012-04-15 ENCOUNTER — Other Ambulatory Visit (INDEPENDENT_AMBULATORY_CARE_PROVIDER_SITE_OTHER): Payer: Medicare Other

## 2012-04-15 DIAGNOSIS — E785 Hyperlipidemia, unspecified: Secondary | ICD-10-CM | POA: Diagnosis not present

## 2012-04-15 DIAGNOSIS — T887XXA Unspecified adverse effect of drug or medicament, initial encounter: Secondary | ICD-10-CM

## 2012-04-15 NOTE — Progress Notes (Signed)
Labs only

## 2012-04-16 LAB — HEPATIC FUNCTION PANEL
ALT: 14 U/L (ref 0–53)
AST: 19 U/L (ref 0–37)
Albumin: 3.8 g/dL (ref 3.5–5.2)
Alkaline Phosphatase: 80 U/L (ref 39–117)
Bilirubin, Direct: 0.1 mg/dL (ref 0.0–0.3)
Total Bilirubin: 0.5 mg/dL (ref 0.3–1.2)
Total Protein: 6.5 g/dL (ref 6.0–8.3)

## 2012-04-16 LAB — LIPID PANEL
Cholesterol: 137 mg/dL (ref 0–200)
HDL: 47 mg/dL (ref >39.00)
LDL Cholesterol: 73 mg/dL (ref 0–99)
Total CHOL/HDL Ratio: 3
Triglycerides: 84 mg/dL (ref 0.0–149.0)
VLDL: 16.8 mg/dL (ref 0.0–40.0)

## 2012-04-17 DIAGNOSIS — M25559 Pain in unspecified hip: Secondary | ICD-10-CM | POA: Diagnosis not present

## 2012-04-17 DIAGNOSIS — M4310 Spondylolisthesis, site unspecified: Secondary | ICD-10-CM | POA: Diagnosis not present

## 2012-04-23 DIAGNOSIS — M25559 Pain in unspecified hip: Secondary | ICD-10-CM | POA: Diagnosis not present

## 2012-04-23 DIAGNOSIS — M169 Osteoarthritis of hip, unspecified: Secondary | ICD-10-CM | POA: Diagnosis not present

## 2012-04-23 DIAGNOSIS — M6740 Ganglion, unspecified site: Secondary | ICD-10-CM | POA: Diagnosis not present

## 2012-04-24 DIAGNOSIS — N138 Other obstructive and reflux uropathy: Secondary | ICD-10-CM | POA: Diagnosis not present

## 2012-04-24 DIAGNOSIS — M169 Osteoarthritis of hip, unspecified: Secondary | ICD-10-CM | POA: Diagnosis not present

## 2012-04-24 DIAGNOSIS — N401 Enlarged prostate with lower urinary tract symptoms: Secondary | ICD-10-CM | POA: Diagnosis not present

## 2012-04-24 DIAGNOSIS — N400 Enlarged prostate without lower urinary tract symptoms: Secondary | ICD-10-CM | POA: Diagnosis not present

## 2012-04-24 DIAGNOSIS — M25559 Pain in unspecified hip: Secondary | ICD-10-CM | POA: Diagnosis not present

## 2012-04-26 ENCOUNTER — Other Ambulatory Visit (HOSPITAL_COMMUNITY): Payer: Self-pay | Admitting: Orthopaedic Surgery

## 2012-05-07 ENCOUNTER — Encounter (HOSPITAL_COMMUNITY): Payer: Self-pay | Admitting: Pharmacy Technician

## 2012-05-09 DIAGNOSIS — H26499 Other secondary cataract, unspecified eye: Secondary | ICD-10-CM | POA: Diagnosis not present

## 2012-05-09 DIAGNOSIS — Z961 Presence of intraocular lens: Secondary | ICD-10-CM | POA: Diagnosis not present

## 2012-05-09 NOTE — Patient Instructions (Addendum)
[address]  05/09/2012   Your procedure is scheduled on: 10-22  -2013  Report to [facility] at      1200 Noon.  Call this number if you have problems the morning of surgery: [phone]  Or Presurgical Testing [phone](Khori Rosevear)   Remember:   Do not eat food:After Midnight.  May have clear liquids:up to 6 Hours before arrival. Nothing after : 0800 AM  Clear liquids include soda, tea, black coffee, apple or grape juice, broth.  Take these medicines the morning of surgery with A SIP OF WATER: Amlodipine. Tramadol. Tylenol. Singulair. Pantoprazole. Tamsulosin.   Do not wear jewelry, make-up or nail polish.  Do not wear lotions, powders, or perfumes. You may wear deodorant.  Do not shave 48 hours prior to surgery.(face and neck okay, no shaving of legs)  Do not bring valuables to the hospital.  Contacts, dentures or bridgework may not be worn into surgery.  Leave suitcase in the car. After surgery it may be brought to your room.  For patients admitted to the hospital, checkout time is 11:00 AM the day of discharge.   Patients discharged the day of surgery will not be allowed to drive home. Must have responsible person with you x 24 hours once discharged.  Name and phone number of your driver: [email or web address] [email or web address] [phone]  Special Instructions: CHG Shower Use Special Wash: see special instruction sheet.(avoid face and genitals)   Please read over the following fact sheets that you were given: MRSA Information, Blood Transfusion fact sheet, Incentive Spirometry Instruction.

## 2012-05-09 NOTE — Pre-Procedure Instructions (Signed)
05-10-12 ZOX(0'96), EAV(4'09) - Epic

## 2012-05-10 ENCOUNTER — Encounter (HOSPITAL_COMMUNITY)
Admission: RE | Admit: 2012-05-10 | Discharge: 2012-05-10 | Disposition: A | Discharge status: Home / Self Care | Payer: Medicare Other | Source: Ambulatory Visit | Attending: Orthopaedic Surgery | Admitting: Orthopaedic Surgery

## 2012-05-10 ENCOUNTER — Encounter (HOSPITAL_COMMUNITY): Payer: Self-pay

## 2012-05-10 DIAGNOSIS — G8929 Other chronic pain: Secondary | ICD-10-CM

## 2012-05-10 HISTORY — PX: BACK SURGERY: SHX140

## 2012-05-10 HISTORY — DX: Other chronic pain: G89.29

## 2012-05-10 LAB — BASIC METABOLIC PANEL
BUN: 25 mg/dL — ABNORMAL HIGH (ref 6–23)
CO2: 27 mEq/L (ref 19–32)
Calcium: 8.9 mg/dL (ref 8.4–10.5)
Chloride: 99 mEq/L (ref 96–112)
Creatinine, Ser: 1.07 mg/dL (ref 0.50–1.35)
GFR calc Af Amer: 74 mL/min — ABNORMAL LOW (ref >90)
GFR calc non Af Amer: 64 mL/min — ABNORMAL LOW (ref >90)
Glucose, Bld: 123 mg/dL — ABNORMAL HIGH (ref 70–99)
Potassium: 4 mEq/L (ref 3.5–5.1)
Sodium: 136 mEq/L (ref 135–145)

## 2012-05-10 LAB — CBC
HCT: 36.8 % — ABNORMAL LOW (ref 39.0–52.0)
Hemoglobin: 12.8 g/dL — ABNORMAL LOW (ref 13.0–17.0)
MCH: 30 pg (ref 26.0–34.0)
MCHC: 34.8 g/dL (ref 30.0–36.0)
MCV: 86.4 fL (ref 78.0–100.0)
Platelets: 205 10*3/uL (ref 150–400)
RBC: 4.26 MIL/uL (ref 4.22–5.81)
RDW: 14.6 % (ref 11.5–15.5)
WBC: 5.7 10*3/uL (ref 4.0–10.5)

## 2012-05-10 LAB — SURGICAL PCR SCREEN
MRSA, PCR: NEGATIVE
Staphylococcus aureus: NEGATIVE

## 2012-05-10 LAB — URINALYSIS, ROUTINE W REFLEX MICROSCOPIC
Bilirubin Urine: NEGATIVE
Glucose, UA: NEGATIVE mg/dL
Hgb urine dipstick: NEGATIVE
Ketones, ur: NEGATIVE mg/dL
Leukocytes, UA: NEGATIVE
Nitrite: NEGATIVE
Protein, ur: NEGATIVE mg/dL
Specific Gravity, Urine: 1.023 (ref 1.005–1.030)
Urobilinogen, UA: 0.2 mg/dL (ref 0.0–1.0)
pH: 5.5 (ref 5.0–8.0)

## 2012-05-14 ENCOUNTER — Inpatient Hospital Stay (HOSPITAL_COMMUNITY): Payer: Medicare Other

## 2012-05-14 ENCOUNTER — Encounter (HOSPITAL_COMMUNITY): Payer: Self-pay | Admitting: Anesthesiology

## 2012-05-14 ENCOUNTER — Encounter (HOSPITAL_COMMUNITY): Payer: Self-pay | Admitting: *Deleted

## 2012-05-14 ENCOUNTER — Inpatient Hospital Stay (HOSPITAL_COMMUNITY): Payer: Medicare Other | Admitting: Anesthesiology

## 2012-05-14 ENCOUNTER — Encounter (HOSPITAL_COMMUNITY)
Admission: RE | Disposition: A | Discharge status: Home Health | Payer: Self-pay | Source: Ambulatory Visit | Attending: Orthopaedic Surgery

## 2012-05-14 ENCOUNTER — Inpatient Hospital Stay (HOSPITAL_COMMUNITY)
Admission: RE | Admit: 2012-05-14 | Discharge: 2012-05-16 | DRG: 470 | Disposition: A | Discharge status: Home Health | Payer: Medicare Other | Source: Ambulatory Visit | Attending: Orthopaedic Surgery | Admitting: Orthopaedic Surgery

## 2012-05-14 DIAGNOSIS — Z471 Aftercare following joint replacement surgery: Secondary | ICD-10-CM | POA: Diagnosis not present

## 2012-05-14 DIAGNOSIS — I10 Essential (primary) hypertension: Secondary | ICD-10-CM | POA: Diagnosis not present

## 2012-05-14 DIAGNOSIS — E669 Obesity, unspecified: Secondary | ICD-10-CM | POA: Diagnosis present

## 2012-05-14 DIAGNOSIS — N401 Enlarged prostate with lower urinary tract symptoms: Secondary | ICD-10-CM | POA: Diagnosis not present

## 2012-05-14 DIAGNOSIS — N138 Other obstructive and reflux uropathy: Secondary | ICD-10-CM | POA: Diagnosis not present

## 2012-05-14 DIAGNOSIS — M1610 Unilateral primary osteoarthritis, unspecified hip: Secondary | ICD-10-CM | POA: Diagnosis not present

## 2012-05-14 DIAGNOSIS — G8929 Other chronic pain: Secondary | ICD-10-CM | POA: Diagnosis present

## 2012-05-14 DIAGNOSIS — Z96659 Presence of unspecified artificial knee joint: Secondary | ICD-10-CM

## 2012-05-14 DIAGNOSIS — Z6831 Body mass index (BMI) 31.0-31.9, adult: Secondary | ICD-10-CM

## 2012-05-14 DIAGNOSIS — Z87891 Personal history of nicotine dependence: Secondary | ICD-10-CM

## 2012-05-14 DIAGNOSIS — E785 Hyperlipidemia, unspecified: Secondary | ICD-10-CM | POA: Diagnosis present

## 2012-05-14 DIAGNOSIS — Z96619 Presence of unspecified artificial shoulder joint: Secondary | ICD-10-CM | POA: Diagnosis not present

## 2012-05-14 DIAGNOSIS — N400 Enlarged prostate without lower urinary tract symptoms: Secondary | ICD-10-CM | POA: Diagnosis present

## 2012-05-14 DIAGNOSIS — D62 Acute posthemorrhagic anemia: Secondary | ICD-10-CM | POA: Diagnosis not present

## 2012-05-14 DIAGNOSIS — Z8249 Family history of ischemic heart disease and other diseases of the circulatory system: Secondary | ICD-10-CM

## 2012-05-14 DIAGNOSIS — Z79899 Other long term (current) drug therapy: Secondary | ICD-10-CM | POA: Diagnosis not present

## 2012-05-14 DIAGNOSIS — M25559 Pain in unspecified hip: Secondary | ICD-10-CM | POA: Diagnosis not present

## 2012-05-14 DIAGNOSIS — M545 Low back pain, unspecified: Secondary | ICD-10-CM | POA: Diagnosis present

## 2012-05-14 DIAGNOSIS — Z96649 Presence of unspecified artificial hip joint: Secondary | ICD-10-CM | POA: Diagnosis not present

## 2012-05-14 DIAGNOSIS — M169 Osteoarthritis of hip, unspecified: Secondary | ICD-10-CM

## 2012-05-14 DIAGNOSIS — Z886 Allergy status to analgesic agent status: Secondary | ICD-10-CM | POA: Diagnosis not present

## 2012-05-14 DIAGNOSIS — Z860100 Personal history of colon polyps, unspecified: Secondary | ICD-10-CM

## 2012-05-14 DIAGNOSIS — M129 Arthropathy, unspecified: Secondary | ICD-10-CM | POA: Diagnosis present

## 2012-05-14 DIAGNOSIS — K219 Gastro-esophageal reflux disease without esophagitis: Secondary | ICD-10-CM | POA: Diagnosis not present

## 2012-05-14 DIAGNOSIS — Z8719 Personal history of other diseases of the digestive system: Secondary | ICD-10-CM | POA: Diagnosis not present

## 2012-05-14 DIAGNOSIS — M1710 Unilateral primary osteoarthritis, unspecified knee: Secondary | ICD-10-CM | POA: Diagnosis not present

## 2012-05-14 DIAGNOSIS — M6740 Ganglion, unspecified site: Secondary | ICD-10-CM | POA: Diagnosis not present

## 2012-05-14 HISTORY — PX: GANGLION CYST EXCISION: SHX1691

## 2012-05-14 HISTORY — PX: TOTAL HIP ARTHROPLASTY: SHX124

## 2012-05-14 LAB — PROTIME-INR
INR: 0.98 (ref 0.00–1.49)
Prothrombin Time: 12.9 seconds (ref 11.6–15.2)

## 2012-05-14 LAB — TYPE AND SCREEN
ABO/RH(D): B POS
Antibody Screen: NEGATIVE

## 2012-05-14 LAB — APTT: aPTT: 36 seconds (ref 24–37)

## 2012-05-14 LAB — ABO/RH: ABO/RH(D): B POS

## 2012-05-14 SURGERY — ARTHROPLASTY, HIP, TOTAL, ANTERIOR APPROACH
Anesthesia: General | Site: Wrist | Laterality: Right | Wound class: Clean

## 2012-05-14 MED ORDER — HYDROMORPHONE HCL PF 1 MG/ML IJ SOLN
1.0000 mg | INTRAMUSCULAR | Status: DC | PRN
  Administered 2012-05-14: 1 mg via INTRAVENOUS

## 2012-05-14 MED ORDER — DIPHENHYDRAMINE HCL 12.5 MG/5ML PO ELIX: 12.5000 mg | ORAL_SOLUTION | ORAL | Status: DC | PRN

## 2012-05-14 MED ORDER — CEFAZOLIN SODIUM-DEXTROSE 2-3 GM-% IV SOLR
2.0000 g | INTRAVENOUS | Status: AC
  Administered 2012-05-14: 2 g via INTRAVENOUS

## 2012-05-14 MED ORDER — HYDROCHLOROTHIAZIDE 12.5 MG PO CAPS
12.5000 mg | ORAL_CAPSULE | Freq: Every day | ORAL | Status: DC
  Administered 2012-05-15 – 2012-05-16 (×2): 12.5 mg via ORAL
  Filled 2012-05-14 (×2): qty 1

## 2012-05-14 MED ORDER — MONTELUKAST SODIUM 10 MG PO TABS
10.0000 mg | ORAL_TABLET | Freq: Every day | ORAL | Status: DC
  Administered 2012-05-14 – 2012-05-15 (×2): 10 mg via ORAL
  Filled 2012-05-14 (×3): qty 1

## 2012-05-14 MED ORDER — CEFAZOLIN SODIUM 1-5 GM-% IV SOLN
1.0000 g | Freq: Four times a day (QID) | INTRAVENOUS | Status: AC
  Administered 2012-05-14 – 2012-05-15 (×2): 1 g via INTRAVENOUS
  Filled 2012-05-14 (×2): qty 50

## 2012-05-14 MED ORDER — ONDANSETRON HCL 4 MG PO TABS: 4.0000 mg | ORAL_TABLET | Freq: Four times a day (QID) | ORAL | Status: DC | PRN

## 2012-05-14 MED ORDER — PANTOPRAZOLE SODIUM 40 MG PO TBEC
40.0000 mg | DELAYED_RELEASE_TABLET | Freq: Every day | ORAL | Status: DC
  Administered 2012-05-15 – 2012-05-16 (×2): 40 mg via ORAL
  Filled 2012-05-14 (×4): qty 1

## 2012-05-14 MED ORDER — FERROUS SULFATE 325 (65 FE) MG PO TABS
325.0000 mg | ORAL_TABLET | Freq: Three times a day (TID) | ORAL | Status: DC
  Administered 2012-05-14 – 2012-05-16 (×6): 325 mg via ORAL
  Filled 2012-05-14 (×9): qty 1

## 2012-05-14 MED ORDER — ZOLPIDEM TARTRATE 5 MG PO TABS: 5.0000 mg | ORAL_TABLET | Freq: Every evening | ORAL | Status: DC | PRN

## 2012-05-14 MED ORDER — AMLODIPINE BESYLATE 5 MG PO TABS
5.0000 mg | ORAL_TABLET | Freq: Every morning | ORAL | Status: DC
  Administered 2012-05-15 – 2012-05-16 (×2): 5 mg via ORAL
  Filled 2012-05-14 (×2): qty 1

## 2012-05-14 MED ORDER — LACTATED RINGERS IV SOLN
INTRAVENOUS | Status: DC
  Administered 2012-05-14: 1000 mL via INTRAVENOUS

## 2012-05-14 MED ORDER — LIDOCAINE HCL (CARDIAC) 20 MG/ML IV SOLN
INTRAVENOUS | Status: DC | PRN
  Administered 2012-05-14: 50 mg via INTRAVENOUS

## 2012-05-14 MED ORDER — HYDROMORPHONE HCL PF 1 MG/ML IJ SOLN
INTRAMUSCULAR | Status: AC
  Filled 2012-05-14: qty 1

## 2012-05-14 MED ORDER — LACTATED RINGERS IV SOLN
INTRAVENOUS | Status: DC | PRN
  Administered 2012-05-14 (×2): via INTRAVENOUS

## 2012-05-14 MED ORDER — CYANOCOBALAMIN 250 MCG PO TABS
1250.0000 ug | ORAL_TABLET | Freq: Every morning | ORAL | Status: DC
  Administered 2012-05-15 – 2012-05-16 (×2): 1250 ug via ORAL
  Filled 2012-05-14 (×2): qty 5

## 2012-05-14 MED ORDER — POLYETHYLENE GLYCOL 3350 17 G PO PACK
17.0000 g | PACK | Freq: Two times a day (BID) | ORAL | Status: DC
  Administered 2012-05-14 – 2012-05-16 (×4): 17 g via ORAL

## 2012-05-14 MED ORDER — DOCUSATE SODIUM 100 MG PO CAPS
100.0000 mg | ORAL_CAPSULE | Freq: Two times a day (BID) | ORAL | Status: DC
  Administered 2012-05-14 – 2012-05-16 (×4): 100 mg via ORAL

## 2012-05-14 MED ORDER — ACETAMINOPHEN 650 MG RE SUPP: 650.0000 mg | Freq: Four times a day (QID) | RECTAL | Status: DC | PRN

## 2012-05-14 MED ORDER — LOSARTAN POTASSIUM 50 MG PO TABS
100.0000 mg | ORAL_TABLET | Freq: Every day | ORAL | Status: DC
  Administered 2012-05-15 – 2012-05-16 (×2): 100 mg via ORAL
  Filled 2012-05-14 (×2): qty 2

## 2012-05-14 MED ORDER — GLYCOPYRROLATE 0.2 MG/ML IJ SOLN
INTRAMUSCULAR | Status: DC | PRN
  Administered 2012-05-14: .6 mg via INTRAVENOUS

## 2012-05-14 MED ORDER — TAMSULOSIN HCL 0.4 MG PO CAPS
0.4000 mg | ORAL_CAPSULE | Freq: Every day | ORAL | Status: DC
  Administered 2012-05-14 – 2012-05-15 (×2): 0.4 mg via ORAL
  Filled 2012-05-14 (×3): qty 1

## 2012-05-14 MED ORDER — BUPIVACAINE HCL (PF) 0.25 % IJ SOLN
INTRAMUSCULAR | Status: DC | PRN
  Administered 2012-05-14: 6 mL

## 2012-05-14 MED ORDER — 0.9 % SODIUM CHLORIDE (POUR BTL) OPTIME
TOPICAL | Status: DC | PRN
  Administered 2012-05-14: 1000 mL

## 2012-05-14 MED ORDER — EPHEDRINE SULFATE 50 MG/ML IJ SOLN
INTRAMUSCULAR | Status: DC | PRN
  Administered 2012-05-14: 5 mg via INTRAVENOUS

## 2012-05-14 MED ORDER — FENTANYL CITRATE 0.05 MG/ML IJ SOLN
INTRAMUSCULAR | Status: DC | PRN
  Administered 2012-05-14: 100 ug via INTRAVENOUS
  Administered 2012-05-14: 50 ug via INTRAVENOUS
  Administered 2012-05-14: 100 ug via INTRAVENOUS
  Administered 2012-05-14 (×2): 50 ug via INTRAVENOUS

## 2012-05-14 MED ORDER — PROPOFOL 10 MG/ML IV BOLUS
INTRAVENOUS | Status: DC | PRN
  Administered 2012-05-14: 120 mg via INTRAVENOUS

## 2012-05-14 MED ORDER — METOCLOPRAMIDE HCL 5 MG/ML IJ SOLN
5.0000 mg | Freq: Three times a day (TID) | INTRAMUSCULAR | Status: DC | PRN
  Administered 2012-05-16: 10 mg via INTRAVENOUS

## 2012-05-14 MED ORDER — ALUM & MAG HYDROXIDE-SIMETH 200-200-20 MG/5ML PO SUSP: 30.0000 mL | ORAL | Status: DC | PRN

## 2012-05-14 MED ORDER — BUPIVACAINE HCL (PF) 0.25 % IJ SOLN
INTRAMUSCULAR | Status: AC
  Filled 2012-05-14: qty 30

## 2012-05-14 MED ORDER — OXYCODONE HCL ER 10 MG PO T12A
10.0000 mg | EXTENDED_RELEASE_TABLET | Freq: Two times a day (BID) | ORAL | Status: DC
  Administered 2012-05-14 – 2012-05-16 (×4): 10 mg via ORAL
  Filled 2012-05-14 (×4): qty 1

## 2012-05-14 MED ORDER — PROMETHAZINE HCL 25 MG/ML IJ SOLN: 6.2500 mg | INTRAMUSCULAR | Status: DC | PRN

## 2012-05-14 MED ORDER — ACETAMINOPHEN 10 MG/ML IV SOLN
INTRAVENOUS | Status: DC | PRN
  Administered 2012-05-14: 1000 mg via INTRAVENOUS

## 2012-05-14 MED ORDER — SODIUM CHLORIDE 0.9 % IV SOLN
INTRAVENOUS | Status: DC
  Administered 2012-05-14: 18:00:00 via INTRAVENOUS

## 2012-05-14 MED ORDER — ACETAMINOPHEN 325 MG PO TABS: 650.0000 mg | ORAL_TABLET | Freq: Four times a day (QID) | ORAL | Status: DC | PRN

## 2012-05-14 MED ORDER — HYDROMORPHONE HCL PF 1 MG/ML IJ SOLN
0.2500 mg | INTRAMUSCULAR | Status: DC | PRN
  Administered 2012-05-14 (×4): 0.5 mg via INTRAVENOUS

## 2012-05-14 MED ORDER — SIMVASTATIN 10 MG PO TABS
10.0000 mg | ORAL_TABLET | Freq: Every day | ORAL | Status: DC
  Administered 2012-05-15: 10 mg via ORAL
  Filled 2012-05-14 (×2): qty 1

## 2012-05-14 MED ORDER — METHOCARBAMOL 500 MG PO TABS
500.0000 mg | ORAL_TABLET | Freq: Four times a day (QID) | ORAL | Status: DC | PRN
  Administered 2012-05-15 – 2012-05-16 (×5): 500 mg via ORAL
  Filled 2012-05-14 (×5): qty 1

## 2012-05-14 MED ORDER — ONDANSETRON HCL 4 MG/2ML IJ SOLN
INTRAMUSCULAR | Status: DC | PRN
  Administered 2012-05-14: 4 mg via INTRAVENOUS

## 2012-05-14 MED ORDER — KETOROLAC TROMETHAMINE 15 MG/ML IJ SOLN
7.5000 mg | Freq: Four times a day (QID) | INTRAMUSCULAR | Status: AC
  Administered 2012-05-15 (×3): 7.5 mg via INTRAVENOUS
  Filled 2012-05-14 (×3): qty 1

## 2012-05-14 MED ORDER — NEOSTIGMINE METHYLSULFATE 1 MG/ML IJ SOLN
INTRAMUSCULAR | Status: DC | PRN
  Administered 2012-05-14: 4 mg via INTRAVENOUS

## 2012-05-14 MED ORDER — PHENOL 1.4 % MT LIQD
1.0000 | OROMUCOSAL | Status: DC | PRN
  Filled 2012-05-14: qty 177

## 2012-05-14 MED ORDER — ROCURONIUM BROMIDE 100 MG/10ML IV SOLN
INTRAVENOUS | Status: DC | PRN
  Administered 2012-05-14: 20 mg via INTRAVENOUS
  Administered 2012-05-14: 10 mg via INTRAVENOUS
  Administered 2012-05-14: 50 mg via INTRAVENOUS

## 2012-05-14 MED ORDER — METOCLOPRAMIDE HCL 10 MG PO TABS: 5.0000 mg | ORAL_TABLET | Freq: Three times a day (TID) | ORAL | Status: DC | PRN

## 2012-05-14 MED ORDER — OXYCODONE HCL 5 MG PO TABS
5.0000 mg | ORAL_TABLET | ORAL | Status: DC | PRN
  Administered 2012-05-14 – 2012-05-15 (×3): 5 mg via ORAL
  Administered 2012-05-15 – 2012-05-16 (×3): 10 mg via ORAL
  Filled 2012-05-14 (×2): qty 2
  Filled 2012-05-14 (×3): qty 1
  Filled 2012-05-14 (×2): qty 2

## 2012-05-14 MED ORDER — ONDANSETRON HCL 4 MG/2ML IJ SOLN: 4.0000 mg | Freq: Four times a day (QID) | INTRAMUSCULAR | Status: DC | PRN

## 2012-05-14 MED ORDER — METHOCARBAMOL 100 MG/ML IJ SOLN
500.0000 mg | Freq: Four times a day (QID) | INTRAVENOUS | Status: DC | PRN
  Administered 2012-05-14: 500 mg via INTRAVENOUS
  Filled 2012-05-14: qty 5

## 2012-05-14 MED ORDER — MENTHOL 3 MG MT LOZG
1.0000 | LOZENGE | OROMUCOSAL | Status: DC | PRN
  Filled 2012-05-14: qty 9

## 2012-05-14 MED ORDER — RIVAROXABAN 10 MG PO TABS
10.0000 mg | ORAL_TABLET | Freq: Every day | ORAL | Status: DC
  Administered 2012-05-15 – 2012-05-16 (×2): 10 mg via ORAL
  Filled 2012-05-14 (×4): qty 1

## 2012-05-14 MED ORDER — LOSARTAN POTASSIUM-HCTZ 100-12.5 MG PO TABS: 1.0000 | ORAL_TABLET | Freq: Every morning | ORAL | Status: DC

## 2012-05-14 SURGICAL SUPPLY — 55 items
BAG ZIPLOCK 12X15 (MISCELLANEOUS) ×6 IMPLANT
BANDAGE ELASTIC 3 VELCRO ST LF (GAUZE/BANDAGES/DRESSINGS) ×3 IMPLANT
BANDAGE ELASTIC 4 VELCRO ST LF (GAUZE/BANDAGES/DRESSINGS) ×3 IMPLANT
BANDAGE GAUZE ELAST BULKY 4 IN (GAUZE/BANDAGES/DRESSINGS) ×3 IMPLANT
BLADE SAW SGTL 18X1.27X75 (BLADE) ×3 IMPLANT
CLOTH BEACON ORANGE TIMEOUT ST (SAFETY) ×3 IMPLANT
CORDS BIPOLAR (ELECTRODE) ×3 IMPLANT
CUFF TOURN SGL QUICK 18 (TOURNIQUET CUFF) IMPLANT
CUFF TOURN SGL QUICK 24 (TOURNIQUET CUFF) ×1
CUFF TRNQT CYL 24X4X40X1 (TOURNIQUET CUFF) ×2 IMPLANT
DRAPE C-ARM 42X72 X-RAY (DRAPES) ×3 IMPLANT
DRAPE STERI IOBAN 125X83 (DRAPES) ×3 IMPLANT
DRAPE SURG 17X11 SM STRL (DRAPES) IMPLANT
DRAPE U-SHAPE 47X51 STRL (DRAPES) ×9 IMPLANT
DRSG MEPILEX BORDER 4X8 (GAUZE/BANDAGES/DRESSINGS) ×3 IMPLANT
DURAPREP 26ML APPLICATOR (WOUND CARE) ×6 IMPLANT
ELECT BLADE TIP CTD 4 INCH (ELECTRODE) ×3 IMPLANT
ELECT REM PT RETURN 9FT ADLT (ELECTROSURGICAL) ×3
ELECTRODE REM PT RTRN 9FT ADLT (ELECTROSURGICAL) ×2 IMPLANT
ELIMINATOR HOLE APEX DEPUY (Hips) IMPLANT
FACESHIELD LNG OPTICON STERILE (SAFETY) ×12 IMPLANT
GAUZE XEROFORM 1X8 LF (GAUZE/BANDAGES/DRESSINGS) ×6 IMPLANT
GLOVE BIO SURGEON STRL SZ7 (GLOVE) ×3 IMPLANT
GLOVE BIO SURGEON STRL SZ7.5 (GLOVE) ×3 IMPLANT
GLOVE BIOGEL PI IND STRL 7.5 (GLOVE) IMPLANT
GLOVE BIOGEL PI IND STRL 8 (GLOVE) ×2 IMPLANT
GLOVE BIOGEL PI INDICATOR 7.5 (GLOVE)
GLOVE BIOGEL PI INDICATOR 8 (GLOVE) ×1
GLOVE ECLIPSE 7.0 STRL STRAW (GLOVE) ×3 IMPLANT
GLOVE ORTHO TXT STRL SZ7.5 (GLOVE) ×3 IMPLANT
GOWN STRL REIN XL XLG (GOWN DISPOSABLE) ×6 IMPLANT
KIT BASIN OR (CUSTOM PROCEDURE TRAY) ×3 IMPLANT
NEEDLE HYPO 22GX1.5 SAFETY (NEEDLE) IMPLANT
NEEDLE HYPO 25X1 1.5 SAFETY (NEEDLE) ×3 IMPLANT
NS IRRIG 1000ML POUR BTL (IV SOLUTION) ×3 IMPLANT
PACK LOWER EXTREMITY WL (CUSTOM PROCEDURE TRAY) ×3 IMPLANT
PACK TOTAL JOINT (CUSTOM PROCEDURE TRAY) ×3 IMPLANT
PAD CAST 4YDX4 CTTN HI CHSV (CAST SUPPLIES) ×2 IMPLANT
PADDING CAST COTTON 4X4 STRL (CAST SUPPLIES) ×1
PADDING CAST COTTON 6X4 STRL (CAST SUPPLIES) ×3 IMPLANT
POSITIONER SURGICAL ARM (MISCELLANEOUS) ×3 IMPLANT
SPONGE GAUZE 4X4 12PLY (GAUZE/BANDAGES/DRESSINGS) ×3 IMPLANT
STAPLER VISISTAT 35W (STAPLE) IMPLANT
SUCTION FRAZIER TIP 10 FR DISP (SUCTIONS) ×3 IMPLANT
SUT ETHIBOND NAB CT1 #1 30IN (SUTURE) ×6 IMPLANT
SUT ETHILON 4 0 PS 2 18 (SUTURE) ×3 IMPLANT
SUT VIC AB 1 CT1 36 (SUTURE) ×6 IMPLANT
SUT VIC AB 2-0 CT1 27 (SUTURE) ×2
SUT VIC AB 2-0 CT1 TAPERPNT 27 (SUTURE) ×4 IMPLANT
SUT VLOC 180 0 24IN GS25 (SUTURE) ×3 IMPLANT
SYR CONTROL 10ML LL (SYRINGE) ×3 IMPLANT
TOWEL OR 17X26 10 PK STRL BLUE (TOWEL DISPOSABLE) ×9 IMPLANT
TOWEL OR NON WOVEN STRL DISP B (DISPOSABLE) ×3 IMPLANT
TRAY FOLEY CATH 14FRSI W/METER (CATHETERS) ×3 IMPLANT
WATER STERILE IRR 1500ML POUR (IV SOLUTION) ×6 IMPLANT

## 2012-05-14 NOTE — Preoperative (Signed)
Beta Blockers   Reason not to administer Beta Blockers:Not Applicable 

## 2012-05-14 NOTE — Anesthesia Preprocedure Evaluation (Addendum)
Anesthesia Evaluation  Patient identified by MRN, date of birth, ID band Patient awake    Reviewed: Allergy & Precautions, H&P , NPO status , Patient's Chart, lab work & pertinent test results  Airway Mallampati: II TM Distance: >3 FB Neck ROM: Full    Dental No notable dental hx.    Pulmonary neg pulmonary ROS,  breath sounds clear to auscultation  Pulmonary exam normal       Cardiovascular Exercise Tolerance: Good hypertension, Pt. on medications Rhythm:Regular Rate:Normal     Neuro/Psych S/P major back surgery in July at [facility] including instrumentation. Did fine with general anesthesia.  Neuromuscular disease negative psych ROS   GI/Hepatic Neg liver ROS, GERD-  Medicated,  Endo/Other  negative endocrine ROS  Renal/GU negative Renal ROS  negative genitourinary   Musculoskeletal negative musculoskeletal ROS (+)   Abdominal (+) + obese,   Peds negative pediatric ROS (+)  Hematology negative hematology ROS (+)   Anesthesia Other Findings   Reproductive/Obstetrics negative OB ROS                          Anesthesia Physical Anesthesia Plan  ASA: II  Anesthesia Plan: General   Post-op Pain Management:    Induction: Intravenous  Airway Management Planned: Oral ETT  Additional Equipment:   Intra-op Plan:   Post-operative Plan: Extubation in OR  Informed Consent: I have reviewed the patients History and Physical, chart, labs and discussed the procedure including the risks, benefits and alternatives for the proposed anesthesia with the patient or authorized representative who has indicated his/her understanding and acceptance.   Dental advisory given  Plan Discussed with: CRNA  Anesthesia Plan Comments:         Anesthesia Quick Evaluation

## 2012-05-14 NOTE — Transfer of Care (Signed)
Immediate Anesthesia Transfer of Care Note  Patient: Joshua Lopez  Procedure(s) Performed: Procedure(s) (LRB) with comments: TOTAL HIP ARTHROPLASTY ANTERIOR APPROACH (Right) - Right Total Hip Arthroplasty, Anterior Approach (C-Arm) REMOVAL GANGLION OF WRIST (Right) - Excision Right Wrist Ganglion Cyst  Patient Location: PACU  Anesthesia Type: General  Level of Consciousness: sedated  Airway & Oxygen Therapy: Patient Spontanous Breathing and Patient connected to face mask oxygen  Post-op Assessment: Report given to PACU RN and Post -op Vital signs reviewed and stable  Post vital signs: Reviewed and stable  Complications: No apparent anesthesia complications

## 2012-05-14 NOTE — Brief Op Note (Signed)
05/14/2012  5:37 PM  PATIENT:  Joshua Lopez  78 y.o. male  PRE-OPERATIVE DIAGNOSIS:  Severe osteoarthritis right hip, right wrist ganglion cyst  POST-OPERATIVE DIAGNOSIS:  Severe osteoarthritis right hip, right wrist ganglion cyst  PROCEDURE:  Procedure(s) (LRB) with comments: TOTAL HIP ARTHROPLASTY ANTERIOR APPROACH (Right) - Right Total Hip Arthroplasty, Anterior Approach (C-Arm) REMOVAL GANGLION OF WRIST (Right) - Excision Right Wrist Ganglion Cyst  SURGEON:  Surgeon(s) and Role:    * Kathryne Hitch, MD - Primary  PHYSICIAN ASSISTANT:   ASSISTANTS: none   ANESTHESIA:   general  EBL:  Total I/O In: 1600 [I.V.:1600] Out: 325 [Urine:125; Blood:200]  BLOOD ADMINISTERED:none  DRAINS: none   LOCAL MEDICATIONS USED:  NONE  SPECIMEN:  No Specimen  DISPOSITION OF SPECIMEN:  N/A  COUNTS:  YES  TOURNIQUET:   Total Tourniquet Time Documented: Upper Arm (Right) - 10 minutes  DICTATION: .Other Dictation: Dictation Number [id number]  PLAN OF CARE: Admit to inpatient   PATIENT DISPOSITION:  PACU - hemodynamically stable.   Delay start of Pharmacological VTE agent (>24hrs) due to surgical blood loss or risk of bleeding: no

## 2012-05-14 NOTE — Anesthesia Postprocedure Evaluation (Signed)
  Anesthesia Post-op Note  Patient: Joshua Lopez  Procedure(s) Performed: Procedure(s) (LRB): TOTAL HIP ARTHROPLASTY ANTERIOR APPROACH (Right) REMOVAL GANGLION OF WRIST (Right)  Patient Location: PACU  Anesthesia Type: General  Level of Consciousness: awake and alert   Airway and Oxygen Therapy: Patient Spontanous Breathing  Post-op Pain: mild  Post-op Assessment: Post-op Vital signs reviewed, Patient's Cardiovascular Status Stable, Respiratory Function Stable, Patent Airway and No signs of Nausea or vomiting  Post-op Vital Signs: stable  Complications: No apparent anesthesia complications. Denies complaints.

## 2012-05-14 NOTE — H&P (Signed)
Joshua Lopez is an 78 y.o. male.   Chief Complaint: 1) right hip pain; 2) right wrist mass HPI:   78 yo male well-known to me with xrays showing end-stage OA for his right hip with marginal osteophytes, sclerosis and loss of joint space.  He was failed conservative treatment with NSAIDs, activity modification, etc.  His pain is daily and his mobility and qaulity of life is limited.  He wishes to proceed with a right total hip replacement.  He understands the goals are decreased pain and improved mobility.  The risks are acute blood loss, DVT, infection, nerve injury and fracture.  He also wishes to have a chronic wrist mass excised under the same anesthesia.  Past Medical History  Diagnosis Date  . Esophageal stricture   . Hypertension   . BPH (benign prostatic hypertrophy)     Dr. Isabel Caprice monitors  elevated PSA  . Colon polyp     2006 and 2010  . Hyperlipidemia     NMR  2007  . Subclavian steal syndrome 1997  . GERD (gastroesophageal reflux disease)   . Arthritis     "all over"  . Chronic lower back pain 05-10-12    States pain is related to hip    Past Surgical History  Procedure Date  . Subclavian stent placement 1997    right side  . Prostate biopsy 2000    Dr. Earlene Plater  . Esophageal dilation 2003  . Colonoscopy w/ polypectomy     2006 and 2010; HYPERPLASTIC polyp and  . Ganglion cyst excision 1999    LUE  . Total shoulder arthroplasty 2010    Dr. Dion Saucier; right  . Knee arthroscopy 2001    left  . Joint replacement   . Cataract extraction w/ intraocular lens  implant, bilateral   . Posterior fusion lumbar spine 02/08/12    L2-L4  . Back surgery 05-10-12    lumbar fusion with hardware    Family History  Problem Relation Age of Onset  . Sudden death Father     murdered  . Hypertension Mother   . Skin cancer Mother   . Skin cancer Brother   . Hypertension Brother   . Coronary artery disease Brother     CABG  . Colon cancer Paternal Uncle   . Breast cancer Paternal  Grandmother   . Breast cancer Paternal Aunt   . Colon cancer Paternal Aunt   . Heart attack Paternal Uncle   . Kidney disease Neg Hx    Social History:  reports that he quit smoking about 38 years ago. His smoking use included Cigarettes. He has a 16.5 pack-year smoking history. He quit smokeless tobacco use about 37 years ago. His smokeless tobacco use included Chew. He reports that he does not drink alcohol or use illicit drugs.  Allergies:  Allergies  Allergen Reactions  . Aspirin Rash and Other (See Comments)    Dyspnea, eyelid edema, truncal urticaria--Patient tolerates other NSAIDS at home;    Medications Prior to Admission  Medication Sig Dispense Refill  . acetaminophen (TYLENOL) 500 MG tablet Take 500 mg by mouth every 6 (six) hours as needed. For pain      . amLODipine (NORVASC) 5 MG tablet Take 5 mg by mouth every morning.      . Calcium Carbonate-Vitamin D (CALTRATE 600+D PO) Take 1 tablet by mouth 2 (two) times daily.       . Cyanocobalamin 2500 MCG TABS Take 1,250 mcg by mouth every morning.      Marland Kitchen  docusate sodium (COLACE) 100 MG capsule Take 100 mg by mouth 2 (two) times daily.      . fish oil-omega-3 fatty acids 1000 MG capsule Take 1 g by mouth 2 (two) times daily.       Marland Kitchen losartan-hydrochlorothiazide (HYZAAR) 100-12.5 MG per tablet Take 1 tablet by mouth every morning.      . montelukast (SINGULAIR) 10 MG tablet Take 10 mg by mouth at bedtime.      . pantoprazole (PROTONIX) 40 MG tablet Take 40 mg by mouth daily before breakfast. 30 minutes before a meal      . polyethylene glycol (MIRALAX / GLYCOLAX) packet Take 17 g by mouth 2 (two) times daily.       . pravastatin (PRAVACHOL) 20 MG tablet Take 20 mg by mouth at bedtime.      . SAW PALMETTO, SERENOA REPENS, PO Take 1 tablet by mouth every morning.       . Tamsulosin HCl (FLOMAX) 0.4 MG CAPS Take 0.4 mg by mouth at bedtime.       . traMADol (ULTRAM) 50 MG tablet Take 50 mg by mouth every 6 (six) hours as needed. For  back pain      . triamcinolone cream (KENALOG) 0.1 % Apply 1 application topically daily as needed. For itchy skin      . amoxicillin (AMOXIL) 500 MG capsule Take 2,000 mg by mouth once as needed. One hour before dental appointment        Results for orders placed during the hospital encounter of 05/14/12 (from the past 48 hour(s))  ABO/RH     Status: Normal   Collection Time   05/14/12 12:25 PM      Component Value Range Comment   ABO/RH(D) B POS     TYPE AND SCREEN     Status: Normal   Collection Time   05/14/12 12:30 PM      Component Value Range Comment   ABO/RH(D) B POS      Antibody Screen NEG      Sample Expiration 05/17/2012     PROTIME-INR     Status: Normal   Collection Time   05/14/12 12:51 PM      Component Value Range Comment   Prothrombin Time 12.9  11.6 - 15.2 seconds    INR 0.98  0.00 - 1.49   APTT     Status: Normal   Collection Time   05/14/12 12:51 PM      Component Value Range Comment   aPTT 36  24 - 37 seconds    No results found.  Review of Systems  Musculoskeletal: Positive for joint pain.  All other systems reviewed and are negative.    Blood pressure 153/93, pulse 65, temperature 97.7 F (36.5 C), resp. rate 20, SpO2 96.00%. Physical Exam  Constitutional: He is oriented to person, place, and time. He appears well-developed and well-nourished.  HENT:  Head: Normocephalic and atraumatic.  Eyes: EOM are normal. Pupils are equal, round, and reactive to light.  Neck: Normal range of motion. Neck supple.  Cardiovascular: Normal rate and regular rhythm.   Respiratory: Effort normal and breath sounds normal.  GI: Soft. Bowel sounds are normal.  Musculoskeletal:       Right wrist: He exhibits swelling and deformity.       Right hip: He exhibits decreased range of motion, decreased strength, bony tenderness and crepitus.  Neurological: He is alert and oriented to person, place, and time.  Skin: Skin is warm  and dry.  Psychiatric: He has a normal  mood and affect.     Assessment/Plan 1) End-stage OA right hip 2) Right wrist cyst To the OR today for a right total hip replacement and excision of a ganglion cyst right wrist then admission as an inpatient.  Hopefully d/c to home with HHPT after 2-3 days.  Kathryne Hitch 05/14/2012, 2:58 PM

## 2012-05-15 LAB — CBC
HCT: 28.4 % — ABNORMAL LOW (ref 39.0–52.0)
Hemoglobin: 9.8 g/dL — ABNORMAL LOW (ref 13.0–17.0)
MCH: 29.9 pg (ref 26.0–34.0)
MCHC: 34.5 g/dL (ref 30.0–36.0)
MCV: 86.6 fL (ref 78.0–100.0)
Platelets: 142 10*3/uL — ABNORMAL LOW (ref 150–400)
RBC: 3.28 MIL/uL — ABNORMAL LOW (ref 4.22–5.81)
RDW: 14.8 % (ref 11.5–15.5)
WBC: 6 10*3/uL (ref 4.0–10.5)

## 2012-05-15 LAB — BASIC METABOLIC PANEL
BUN: 24 mg/dL — ABNORMAL HIGH (ref 6–23)
CO2: 27 mEq/L (ref 19–32)
Calcium: 8.1 mg/dL — ABNORMAL LOW (ref 8.4–10.5)
Chloride: 99 mEq/L (ref 96–112)
Creatinine, Ser: 1.1 mg/dL (ref 0.50–1.35)
GFR calc Af Amer: 71 mL/min — ABNORMAL LOW (ref >90)
GFR calc non Af Amer: 61 mL/min — ABNORMAL LOW (ref >90)
Glucose, Bld: 119 mg/dL — ABNORMAL HIGH (ref 70–99)
Potassium: 4.4 mEq/L (ref 3.5–5.1)
Sodium: 134 mEq/L — ABNORMAL LOW (ref 135–145)

## 2012-05-15 LAB — GLUCOSE, CAPILLARY: Glucose-Capillary: 143 mg/dL — ABNORMAL HIGH (ref 70–99)

## 2012-05-15 NOTE — Op Note (Signed)
Joshua Lopez, CUAUTLE NO.:  [id number]  MEDICAL RECORD NO.:  [id number]  LOCATION:  1621                         FACILITY:  [facility]  PHYSICIAN:  Joshua Lopez, M.D.DATE OF BIRTH:  10-01-1929  DATE OF PROCEDURE:  05/14/2012 DATE OF DISCHARGE:                              OPERATIVE REPORT   PREOPERATIVE DIAGNOSES: 1. Severe osteoarthritis and degenerative joint disease, right hip. 2. Right wrist dorsal/radial ganglion cyst.  PROCEDURES: 1. Excision of right wrist ganglion cyst. 2. Right total hip arthroplasty through direct anterior approach.  IMPLANTS:  DePuy Sector Gription acetabular component size 56, size 36+ 4 neutral polyethylene liner, size 12 Corail femoral component with standard offset, size 36+ 1.5 metal hip ball.  SURGEON:  Doneen Poisson, MD.  ANESTHESIA:  General.  ANTIBIOTICS:  IV Ancef 2 g.  BLOOD LOSS:  250 mL.  TOURNIQUET TIME FOR THE ARM:  10 minutes.  COMPLICATIONS:  None.  INDICATIONS:  [name] is a very active 78 year old gentleman with severe end-stage arthritis of his right hip that has failed conservative treatment.  X-ray showed this complete loss of the joint space with sclerotic changes and cystic changes in the acetabulum and femoral head with very conservative treatment.  He wished to proceed with a total hip arthroplasty.  Since he was going to be under anesthesia, he also wished to have the cyst removed from his right wrist.  This has been aspirated before and was since consistent with a ganglion cyst.  It is multiloculated and it is very bothersome to him.  It is over the superficial radial nerve and this was becoming quite problematic so he wished to have this excised as well.  The risks and benefits of surgery were explained to him in detail and he did wish to proceed with surgery.  PROCEDURE DESCRIPTION:  After informed consent was obtained, appropriate right wrist and right hip were marked.  He  was brought to the operating room and general anesthesia was obtained while he was on the stretcher. A Foley catheter was placed and then both feet were placed in traction boots.  He was then placed supine on the Hana fracture table with the perineal post in place and both legs in inline skeletal traction but no traction applied.  His right arm was placed then on the arm table.  We decided to tackle the arm first.  We placed a nonsterile tourniquet around his upper right arm.  We prepped his right arm from the elbow down to the fingers with DuraPrep and sterile drapes.  Time-out was called to identify the correct patient and correct right wrist.  I then used an Esmarch to wrap out the wrist and tourniquet inflated to 250 mm pressure.  I made an incision on the dorsal radial aspect of the wrist and dissected the cyst which I easily excised as a multiloculated cyst, and it seemed to be contiguous with both the joint and possibly the first dorsal compartment tendons.  Once the cyst softened and it was entirely removed, I tried to oversew any type of hole.  I then copiously irrigated the tissues and closed with normal saline solution and closed the skin with  interrupted 4-0 nylon suture.  I infiltrated the incision with 0.25% plain Sensorcaine.  Xeroform followed by well-padded sterile dressing was applied, and the tourniquet was let down, and the fingers pinkened nicely.  Attention was then turned to the right hip.  The right hip was then prepped and draped with DuraPrep and sterile drapes.  Time- out was called to identify the correct patient and correct right hip.  I then made an incision just inferior and posterior to the anterior- superior iliac spine and carried this obliquely down the leg.  I dissected down to the tensor fascia lata and the tensor fascia was divided longitudinally.  I then proceeded with a direct anterior approach to the hip.  Cobra retractor was placed around the  lateral neck and underneath the rectus femoris.  The Cobra retractor was placed medial.  I cauterized the lateral femoral circumflex vessels and dissected down to the hip capsule.  I then divided the hip capsule and placed the Cobra retractor within the hip capsule.  I made my femoral neck cut just proximal to the lesser trochanter using an oscillating saw and completed this with an osteotome.  I then placed a corkscrew guide in the femoral head and removed the femoral head in its entirety.  A bent Hohmann was placed medially at the acetabular rim and a Cobra retractor laterally.  I cleaned the acetabulum debris including remnants of the labrum.  I then began reaming with a size 44 reamer in 2 mm increments and even steps all the way up to a size 56 with the last 2 reamers placed all under direct fluoroscopy and all reamers placed under direct visualization.  Once I was able to get my depth of reaming to my inclination and my anteversion, I placed a real size 56 Sector Gription acetabular component from DePuy and a  real 36+ 4 neutral polyethylene liner.  The attention was then turned to the femur.  With the leg externally rotated to 90 degrees extended and adducted, I placed a Muller retractor medially and a bent Hohmann underneath the greater trochanter.  I released the lateral capsule and the piriformis.  I was able to bring the femur out.  I lateralized it with a rongeur and used a box cutting guide to enter the femur.  I then began broaching with a size 8 broach all the way up to size 12.  A size 12 was felt to be stable, so I trialed a 36+ 1.5 hip ball.  We brought the leg back over and up once traction and internal rotation reduced the hip.  This was stable on internal and external rotation.  His leg lengths were measured to be about equal on direct fluoroscopy and there was a minimal shuck. I then dislocated the hip again and removed all trial components.  I placed the real  hydroxyapatite coated femoral component size 12, it was a DePuy component with Corail with standard offset.  I placed the real 36+ 1.5 metal hip ball.  We reduced this back into the acetabulum and it was nice and stable.  We copiously irrigated the soft tissue with normal saline solution.  I closed the joint capsule with interrupted #1 Ethibond suture followed by running 0 V-Loc suture in the tensor fascia lata, 2-0 Vicryl in the subcutaneous tissue, and staples on the skin to close.  Xeroform followed by a well-padded sterile dressing was applied. He was taken off the Hana table, awakened, extubated, and taken to recovery room in  stable condition.  All final counts were correct. There were no complications noted.     Joshua Lopez, M.D.     CYB/MEDQ  D:  05/14/2012  T:  05/15/2012  Job:  [id number]

## 2012-05-15 NOTE — Progress Notes (Signed)
Physical Therapy Treatment Patient Details Name: Joshua Lopez MRN: [id number] DOB: 06-28-1930 Today's Date: 05/15/2012 Time: 4098-1191 PT Time Calculation (min): 24 min  PT Assessment / Plan / Recommendation Comments on Treatment Session       Follow Up Recommendations  Home health PT     Does the patient have the potential to tolerate intense rehabilitation     Barriers to Discharge        Equipment Recommendations  None recommended by OT;None recommended by PT    Recommendations for Other Services OT consult  Frequency 7X/week   Plan Discharge plan remains appropriate    Precautions / Restrictions Precautions Precautions: Fall Restrictions Weight Bearing Restrictions: No Other Position/Activity Restrictions: WBAT   Pertinent Vitals/Pain     Mobility  Bed Mobility Bed Mobility: Supine to Sit;Sit to Supine Supine to Sit: 4: Min assist Sit to Supine: 4: Min assist Details for Bed Mobility Assistance: Cues and assist to log roll to L side and move to sitting - pt with recent back surgery Transfers Transfers: Sit to Stand;Stand to Sit Sit to Stand: 4: Min assist Stand to Sit: 4: Min assist Details for Transfer Assistance: cues for use of UEs and for LE management Ambulation/Gait Ambulation/Gait Assistance: 4: Min assist Ambulation Distance (Feet): 200 Feet (and 25') Assistive device: Rolling walker Ambulation/Gait Assistance Details: cues for ER on R, posture, and position from RW Gait Pattern: Step-to pattern;Step-through pattern    Exercises     PT Diagnosis:    PT Problem List:   PT Treatment Interventions:     PT Goals Acute Rehab PT Goals PT Goal Formulation: With patient Time For Goal Achievement: 05/21/12 Potential to Achieve Goals: Good Pt will go Supine/Side to Sit: with supervision PT Goal: Supine/Side to Sit - Progress: Progressing toward goal Pt will go Sit to Supine/Side: with supervision PT Goal: Sit to Supine/Side - Progress: Progressing  toward goal Pt will go Sit to Stand: with supervision PT Goal: Sit to Stand - Progress: Progressing toward goal Pt will go Stand to Sit: with supervision PT Goal: Stand to Sit - Progress: Progressing toward goal Pt will Ambulate: >150 feet;with supervision;with rolling walker PT Goal: Ambulate - Progress: Progressing toward goal  Visit Information  Last PT Received On: 05/15/12 Assistance Needed: +1    Subjective Data  Subjective: I had recent back surgery so I need to be careful  Patient Stated Goal: Resume previous lifestyle with decreased pain   Cognition  Overall Cognitive Status: Appears within functional limits for tasks assessed/performed Arousal/Alertness: Awake/alert Orientation Level: Appears intact for tasks assessed Behavior During Session: [facility] for tasks performed    Balance     End of Session PT - End of Session Equipment Utilized During Treatment: Gait belt Activity Tolerance: Patient tolerated treatment well Patient left: with call bell/phone within reach;with nursing in room;in bed Nurse Communication: Mobility status   GP     Nevin Kozuch 05/15/2012, 4:27 PM

## 2012-05-15 NOTE — Evaluation (Signed)
Occupational Therapy Evaluation Patient Details Name: Joshua Lopez MRN: [id number] DOB: Dec 02, 1929 Today's Date: 05/15/2012 Time: 4098-1191 OT Time Calculation (min): 22 min  OT Assessment / Plan / Recommendation Clinical Impression  This 78 year old man was admitted for R anterior direct THA.  He has had previous back surgery and still follows some precautions to minimize back strain.  Educated pt on shower transfer, AE,  and answered his and his wife's questions.  No further OT is needed at this time.      OT Assessment  Patient does not need any further OT services    Follow Up Recommendations       Barriers to Discharge      Equipment Recommendations  None recommended by OT;None recommended by PT    Recommendations for Other Services    Frequency       Precautions / Restrictions Restrictions Other Position/Activity Restrictions: WBAT   Pertinent Vitals/Pain No c/o pain    ADL  ADL Comments: Eval focused only on education.  Pt still log rolls and minimizes back strain after back surgery.  Reviewed using AE for adls--although wife will help pt.  Reviewed shower sequence: pt did not feel he needed to practice.  Wife asked about sleep number bed:  should not have an issue with getting up:  Pt rolls to L side.  Reinforced that pt does not have any precautions that he has to follow, but he can do anything he wants for comfort, such as putting pillow between legs when rolling to side (He did this for his back).      OT Diagnosis:    OT Problem List:   OT Treatment Interventions:     OT Goals    Visit Information  Last OT Received On: 05/15/12 Assistance Needed: +1    Subjective Data  Subjective: What do you think about our sleep number bed:  will we need to use another   Prior Functioning     Home Living Bathroom Shower/Tub: Walk-in shower Bathroom Toilet: Handicapped height Home Adaptive Equipment: Environmental consultant - four wheeled;Walker - rolling;Shower chair with  back Prior Function Level of Independence: Independent with assistive device(s) Comments:  (wife will help with adls; they have 4 reachers)         Vision/Perception     Cognition  Overall Cognitive Status: Appears within functional limits for tasks assessed/performed Arousal/Alertness: Awake/alert Orientation Level: Appears intact for tasks assessed Behavior During Session: [facility] for tasks performed    Extremity/Trunk Assessment Right Upper Extremity Assessment RUE ROM/Strength/Tone: [facility] for tasks assessed Left Upper Extremity Assessment LUE ROM/Strength/Tone: [facility] for tasks assessed     Mobility       Shoulder Instructions     Exercise     Balance     End of Session    GO     Joshua Lopez 05/15/2012, 2:01 PM Joshua Lopez, OTR/L [phone] 05/15/2012

## 2012-05-15 NOTE — Care Management Note (Signed)
  Page 2 of 2   05/15/2012     4:59:20 PM   CARE MANAGEMENT NOTE 05/15/2012  Patient:  Joshua Lopez, Joshua Lopez   Account Number:  [id number]  Date Initiated:  05/15/2012  Documentation initiated by:  Colleen Can  Subjective/Objective Assessment:   DX SEVERE OSTEOARTHRITIS RIGHT HIP, RT WRIST GANGLION CYST; TOTAL HIP REPLACEMNT -ANTERIOR APPROACH, REMOVAL GANGLION CYST     Action/Plan:   CM spoke with patient. Plans are for patient to return to his home in [place] where spouse will be caregiver. He is requesting specific physical therapist and wants [facility] for [facility] services. Already has DME -rw,shower chair   Anticipated DC Date:  05/15/2012   Anticipated DC Plan:  HOME W HOME HEALTH SERVICES  In-house referral  Clinical Social Worker      DC Planning Services  CM consult      [facility] Choice  HOME HEALTH   Choice offered to / List presented to:  C-1 Patient   DME arranged  NA      DME agency  NA     HH arranged  HH-2 PT      [facility] agency  [facility] Care   Status of service:  In process, will continue to follow Medicare Important Message given?  NA - LOS <3 / Initial given by admissions (If response is "NO", the following Medicare IM given date fields will be blank) Date Medicare IM given:   Date Additional Medicare IM given:    Discharge Disposition:    [facility]-spoke with intake-Karen. Faxed HH orders, op note, H&P, face sheet-[phone]-confirmation received.

## 2012-05-15 NOTE — Progress Notes (Signed)
Subjective: 1 Day Post-Op Procedure(s) (LRB): TOTAL HIP ARTHROPLASTY ANTERIOR APPROACH (Right) REMOVAL GANGLION OF WRIST (Right) Patient reports pain as mild.  Asymptomatic acute blood loss anemia.  Objective: Vital signs in last 24 hours: Temp:  [97.6 F (36.4 C)-98.5 F (36.9 C)] 98.1 F (36.7 C) (10/23 0512) Pulse Rate:  [63-94] 63  (10/23 0512) Resp:  [8-20] 16  (10/23 0512) BP: (100-156)/(59-93) 116/68 mmHg (10/23 0512) SpO2:  [95 %-100 %] 99 % (10/23 0512) Weight:  [95.255 kg (210 lb)] 95.255 kg (210 lb) (10/22 1849)  Intake/Output from previous day: 10/22 0701 - 10/23 0700 In: 3293.8 [P.O.:600; I.V.:2638.8; IV Piggyback:55] Out: 1050 [Urine:850; Blood:200] Intake/Output this shift: Total I/O In: 1488.8 [P.O.:600; I.V.:888.8] Out: 600 [Urine:600]   Basename 05/15/12 0445  HGB 9.8*    Basename 05/15/12 0445  WBC 6.0  RBC 3.28*  HCT 28.4*  PLT 142*    Basename 05/15/12 0445  NA 134*  K 4.4  CL 99  CO2 27  BUN 24*  CREATININE 1.10  GLUCOSE 119*  CALCIUM 8.1*    Basename 05/14/12 1251  LABPT --  INR 0.98    Sensation intact distally Intact pulses distally Dorsiflexion/Plantar flexion intact Incision: scant drainage  Assessment/Plan: 1 Day Post-Op Procedure(s) (LRB): TOTAL HIP ARTHROPLASTY ANTERIOR APPROACH (Right) REMOVAL GANGLION OF WRIST (Right) Up with therapy, WBAt right hip and right wrist.  BLACKMAN,CHRISTOPHER Y 05/15/2012, 7:00 AM

## 2012-05-15 NOTE — Evaluation (Signed)
Physical Therapy Evaluation Patient Details Name: Joshua Lopez MRN: [id number] DOB: Jan 18, 1930 Today's Date: 05/15/2012 Time: 0933-1000 PT Time Calculation (min): 27 min  PT Assessment / Plan / Recommendation Clinical Impression  Pt s/p R THR presents with decreased R LE strength/ROM and recent lumbar surgery limiting functional mobility    PT Assessment  Patient needs continued PT services    Follow Up Recommendations  Home health PT    Does the patient have the potential to tolerate intense rehabilitation      Barriers to Discharge None      Equipment Recommendations  None recommended by PT    Recommendations for Other Services OT consult   Frequency 7X/week    Precautions / Restrictions Precautions Precautions: Fall Restrictions Weight Bearing Restrictions: No Other Position/Activity Restrictions: WBAT   Pertinent Vitals/Pain 2/10      Mobility  Bed Mobility Bed Mobility: Supine to Sit Supine to Sit: 4: Min assist;3: Mod assist Details for Bed Mobility Assistance: Cues and assist to log roll to L side and move to sitting - pt with recent back surgery Transfers Transfers: Sit to Stand;Stand to Sit Sit to Stand: 4: Min assist;3: Mod assist Stand to Sit: 4: Min assist;3: Mod assist Details for Transfer Assistance: cues for use of UEs and for LE management Ambulation/Gait Ambulation/Gait Assistance: 4: Min assist;3: Mod assist Ambulation Distance (Feet): 84 Feet Assistive device: Rolling walker Ambulation/Gait Assistance Details: cues for posture, sequence and position from RW Gait Pattern: Step-to pattern;Step-through pattern    Shoulder Instructions     Exercises Total Joint Exercises Ankle Circles/Pumps: AROM;10 reps;Supine;Both Quad Sets: AROM;10 reps;Supine;Both Heel Slides: AAROM;10 reps;Supine;Right Hip ABduction/ADduction: AAROM;10 reps;Right;Supine   PT Diagnosis: Difficulty walking  PT Problem List: Decreased strength;Decreased range of  motion;Decreased activity tolerance;Decreased mobility;Pain;Decreased knowledge of use of DME PT Treatment Interventions: DME instruction;Gait training;Stair training;Functional mobility training;Therapeutic activities;Therapeutic exercise;Patient/family education   PT Goals Acute Rehab PT Goals PT Goal Formulation: With patient Time For Goal Achievement: 05/21/12 Potential to Achieve Goals: Good Pt will go Supine/Side to Sit: with supervision PT Goal: Supine/Side to Sit - Progress: Goal set today Pt will go Sit to Supine/Side: with supervision PT Goal: Sit to Supine/Side - Progress: Goal set today Pt will go Sit to Stand: with supervision PT Goal: Sit to Stand - Progress: Goal set today Pt will go Stand to Sit: with supervision PT Goal: Stand to Sit - Progress: Goal set today Pt will Ambulate: >150 feet;with supervision;with rolling walker PT Goal: Ambulate - Progress: Goal set today Pt will Go Up / Down Stairs: 1-2 stairs;with min assist;with least restrictive assistive device PT Goal: Up/Down Stairs - Progress: Goal set today  Visit Information  Last PT Received On: 05/15/12 Assistance Needed: +1    Subjective Data  Subjective: I had recent back surgery so I need to be careful  Patient Stated Goal: Resume previous lifestyle with decreased pain   Prior Functioning  Home Living Lives With: Spouse Available Help at Discharge: Family;Friend(s) Type of Home: House Home Access: Stairs to enter [employer] of Steps: 2 Entrance Stairs-Rails: None Home Layout: One level Home Adaptive Equipment: Environmental consultant - four wheeled;Walker - rolling Prior Function Level of Independence: Independent with assistive device(s) Able to Take Stairs?: Yes Vocation: Retired Musician: No difficulties    Cognition  Overall Cognitive Status: Appears within functional limits for tasks assessed/performed Arousal/Alertness: Awake/alert Orientation Level: Appears intact for  tasks assessed Behavior During Session: [facility] for tasks performed    Extremity/Trunk Assessment Right  Upper Extremity Assessment RUE ROM/Strength/Tone: WFL for tasks assessed Left Upper Extremity Assessment LUE ROM/Strength/Tone: WFL for tasks assessed Right Lower Extremity Assessment RLE ROM/Strength/Tone: Deficits RLE ROM/Strength/Tone Deficits: hip strength 3-/5 with AAROM to 90 flex and 20 abd Left Lower Extremity Assessment LLE ROM/Strength/Tone: WFL for tasks assessed   Balance    End of Session PT - End of Session Equipment Utilized During Treatment: Gait belt Activity Tolerance: Patient tolerated treatment well Patient left: in chair;with call bell/phone within reach;with nursing in room Nurse Communication: Mobility status  GP     Sheena Simonis 05/15/2012, 10:38 AM

## 2012-05-15 NOTE — Progress Notes (Signed)
Utilization review completed.  

## 2012-05-16 ENCOUNTER — Encounter (HOSPITAL_COMMUNITY): Payer: Self-pay | Admitting: Orthopaedic Surgery

## 2012-05-16 LAB — CBC
HCT: 28.4 % — ABNORMAL LOW (ref 39.0–52.0)
Hemoglobin: 9.8 g/dL — ABNORMAL LOW (ref 13.0–17.0)
MCH: 29.7 pg (ref 26.0–34.0)
MCHC: 34.5 g/dL (ref 30.0–36.0)
MCV: 86.1 fL (ref 78.0–100.0)
Platelets: 136 10*3/uL — ABNORMAL LOW (ref 150–400)
RBC: 3.3 MIL/uL — ABNORMAL LOW (ref 4.22–5.81)
RDW: 14.8 % (ref 11.5–15.5)
WBC: 6.6 10*3/uL (ref 4.0–10.5)

## 2012-05-16 MED ORDER — FERROUS SULFATE 325 (65 FE) MG PO TABS: 325.0000 mg | ORAL_TABLET | Freq: Three times a day (TID) | ORAL | Status: DC

## 2012-05-16 MED ORDER — OXYCODONE-ACETAMINOPHEN 5-325 MG PO TABS: 1.0000 | ORAL_TABLET | ORAL | Status: AC | PRN

## 2012-05-16 MED ORDER — ASPIRIN EC 325 MG PO TBEC: 325.0000 mg | DELAYED_RELEASE_TABLET | Freq: Two times a day (BID) | ORAL | Status: DC

## 2012-05-16 MED ORDER — METHOCARBAMOL 500 MG PO TABS: 500.0000 mg | ORAL_TABLET | Freq: Four times a day (QID) | ORAL | Status: DC | PRN

## 2012-05-16 NOTE — Progress Notes (Signed)
Physical Therapy Treatment Patient Details Name: Joshua Lopez MRN: [id number] DOB: April 08, 1930 Today's Date: 05/16/2012 Time: 1020-1034 PT Time Calculation (min): 14 min  PT Assessment / Plan / Recommendation Comments on Treatment Session       Follow Up Recommendations  Home health PT     Does the patient have the potential to tolerate intense rehabilitation     Barriers to Discharge        Equipment Recommendations  None recommended by OT;None recommended by PT    Recommendations for Other Services OT consult  Frequency 7X/week   Plan Discharge plan remains appropriate    Precautions / Restrictions Precautions Precautions: Fall Restrictions Weight Bearing Restrictions: No Other Position/Activity Restrictions: WBAT   Pertinent Vitals/Pain 4/10    Mobility  Bed Mobility Bed Mobility: Supine to Sit;Sit to Supine (Pt states comfortable with ability ) Transfers Transfers: Sit to Stand;Stand to Sit Sit to Stand: 5: Supervision Stand to Sit: 5: Supervision Details for Transfer Assistance: cues for position at [facility] of chair Ambulation/Gait Ambulation/Gait Assistance: 4: Min guard;5: Supervision Ambulation Distance (Feet): 50 Feet Assistive device: Rolling walker Ambulation/Gait Assistance Details: cues for posture and position from RW Gait Pattern: Step-to pattern;Step-through pattern Stairs: Yes Stairs Assistance: 4: Min assist Stairs Assistance Details (indicate cue type and reason): cues for sequence and for foot/RW placement Stair Management Technique: No rails;Backwards;With walker;Step to pattern Number of Stairs: 2  (Twice - neighbor assisting on 2nd attempt)    Exercises Total Joint Exercises Ankle Circles/Pumps: AROM;Supine;Both;20 reps Quad Sets: AROM;Supine;Both;15 reps Heel Slides: AAROM;Supine;Right;20 reps Hip ABduction/ADduction: AAROM;Right;Supine;20 reps   PT Diagnosis:    PT Problem List:   PT Treatment Interventions:     PT Goals Acute  Rehab PT Goals PT Goal Formulation: With patient Time For Goal Achievement: 05/21/12 Potential to Achieve Goals: Good Pt will go Supine/Side to Sit: with supervision PT Goal: Supine/Side to Sit - Progress: Progressing toward goal Pt will go Sit to Supine/Side: with supervision PT Goal: Sit to Supine/Side - Progress: Progressing toward goal Pt will go Sit to Stand: with supervision PT Goal: Sit to Stand - Progress: Met Pt will go Stand to Sit: with supervision PT Goal: Stand to Sit - Progress: Met Pt will Ambulate: >150 feet;with supervision;with rolling walker PT Goal: Ambulate - Progress: Progressing toward goal Pt will Go Up / Down Stairs: 1-2 stairs;with min assist;with least restrictive assistive device PT Goal: Up/Down Stairs - Progress: Met  Visit Information  Last PT Received On: 05/16/12 Assistance Needed: +1    Subjective Data  Subjective: It hurts a little more than yesterday but its getting better as I move Patient Stated Goal: Resume previous lifestyle with decreased pain   Cognition  Overall Cognitive Status: Appears within functional limits for tasks assessed/performed Arousal/Alertness: Awake/alert Orientation Level: Appears intact for tasks assessed Behavior During Session: [facility] for tasks performed    Balance     End of Session PT - End of Session Equipment Utilized During Treatment: Gait belt Activity Tolerance: Patient tolerated treatment well Patient left: with call bell/phone within reach;with nursing in room;in bed Nurse Communication: Mobility status   GP     Dwyane Dupree 05/16/2012, 10:35 AM

## 2012-05-16 NOTE — Discharge Summary (Signed)
Patient ID: Joshua Lopez MRN: [id number] DOB/AGE: [birth year] 78 y.o.  Admit date: 05/14/2012 Discharge date: 05/16/2012  Admission Diagnoses:  Principal Problem:  *Degenerative arthritis of hip   Discharge Diagnoses:  Same  Past Medical History  Diagnosis Date  . Esophageal stricture   . Hypertension   . BPH (benign prostatic hypertrophy)     Dr. Isabel Caprice monitors  elevated PSA  . Colon polyp     2006 and 2010  . Hyperlipidemia     NMR  2007  . Subclavian steal syndrome 1997  . GERD (gastroesophageal reflux disease)   . Arthritis     "all over"  . Chronic lower back pain 05-10-12    States pain is related to hip    Surgeries: Procedure(s): TOTAL HIP ARTHROPLASTY ANTERIOR APPROACH REMOVAL GANGLION OF WRIST on 05/14/2012   Consultants:    Discharged Condition: Improved  Hospital Course: Joshua Lopez is an 78 y.o. male who was admitted 05/14/2012 for operative treatment ofDegenerative arthritis of hip. Patient has severe unremitting pain that affects sleep, daily activities, and work/hobbies. After pre-op clearance the patient was taken to the operating room on 05/14/2012 and underwent  Procedure(s): TOTAL HIP ARTHROPLASTY ANTERIOR APPROACH REMOVAL GANGLION OF WRIST.    Patient was given perioperative antibiotics: Anti-infectives     Start     Dose/Rate Route Frequency Ordered Stop   05/14/12 2130   ceFAZolin (ANCEF) IVPB 1 g/50 mL premix        1 g 100 mL/hr over 30 Minutes Intravenous Every 6 hours 05/14/12 1842 June 07, 2012 0400   05/14/12 1152   ceFAZolin (ANCEF) IVPB 2 g/50 mL premix        2 g 100 mL/hr over 30 Minutes Intravenous 60 min pre-op 05/14/12 1152 05/14/12 1530           Patient was given sequential compression devices, early ambulation, and chemoprophylaxis to prevent DVT.  Patient benefited maximally from hospital stay and there were no complications.    Recent vital signs: Patient Vitals for the past 24 hrs:  BP Temp Temp src Pulse Resp  SpO2  05/16/12 0655 114/85 mmHg 98 F (36.7 C) Oral 80  16  95 %  05/16/12 0400 - - - - 16  96 %  05/16/12 0000 - - - - 15  95 %  06/07/12 2125 113/67 mmHg 98.2 F (36.8 C) Oral 77  16  94 %  2012-06-07 2000 - - - - 16  96 %  07-Jun-2012 1600 - - - - 16  -  06/07/12 1500 95/60 mmHg 97.6 F (36.4 C) - 75  19  96 %  2012-06-07 1200 - - - - 16  97 %  06/07/12 1027 97/60 mmHg 97.6 F (36.4 C) - 78  16  99 %  2012-06-07 0938 134/69 mmHg - - 87  - -  06/07/12 0744 - - - - 16  -     Recent laboratory studies:  Basename 05/16/12 0455 06/07/2012 0445 05/14/12 1251  WBC 6.6 6.0 --  HGB 9.8* 9.8* --  HCT 28.4* 28.4* --  PLT 136* 142* --  NA -- 134* --  K -- 4.4 --  CL -- 99 --  CO2 -- 27 --  BUN -- 24* --  CREATININE -- 1.10 --  GLUCOSE -- 119* --  INR -- -- 0.98  CALCIUM -- 8.1* --     Discharge Medications:     Medication List     As of 05/16/2012  7:28 AM    TAKE these medications         acetaminophen 500 MG tablet   Commonly known as: TYLENOL   Take 500 mg by mouth every 6 (six) hours as needed. For pain      amLODipine 5 MG tablet   Commonly known as: NORVASC   Take 5 mg by mouth every morning.      amoxicillin 500 MG capsule   Commonly known as: AMOXIL   Take 2,000 mg by mouth once as needed. One hour before dental appointment      aspirin EC 325 MG tablet   Take 1 tablet (325 mg total) by mouth 2 (two) times daily.      CALTRATE 600+D PO   Take 1 tablet by mouth 2 (two) times daily.      Cyanocobalamin 2500 MCG Tabs   Take 1,250 mcg by mouth every morning.      docusate sodium 100 MG capsule   Commonly known as: COLACE   Take 100 mg by mouth 2 (two) times daily.      ferrous sulfate 325 (65 FE) MG tablet   Take 1 tablet (325 mg total) by mouth 3 (three) times daily after meals.      fish oil-omega-3 fatty acids 1000 MG capsule   Take 1 g by mouth 2 (two) times daily.      losartan-hydrochlorothiazide 100-12.5 MG per tablet   Commonly known as: HYZAAR    Take 1 tablet by mouth every morning.      methocarbamol 500 MG tablet   Commonly known as: ROBAXIN   Take 1 tablet (500 mg total) by mouth every 6 (six) hours as needed.      montelukast 10 MG tablet   Commonly known as: SINGULAIR   Take 10 mg by mouth at bedtime.      oxyCODONE-acetaminophen 5-325 MG per tablet   Commonly known as: PERCOCET/ROXICET   Take 1-2 tablets by mouth every 4 (four) hours as needed for pain.      pantoprazole 40 MG tablet   Commonly known as: PROTONIX   Take 40 mg by mouth daily before breakfast. 30 minutes before a meal      polyethylene glycol packet   Commonly known as: MIRALAX / GLYCOLAX   Take 17 g by mouth 2 (two) times daily.      pravastatin 20 MG tablet   Commonly known as: PRAVACHOL   Take 20 mg by mouth at bedtime.      SAW PALMETTO (SERENOA REPENS) PO   Take 1 tablet by mouth every morning.      Tamsulosin HCl 0.4 MG Caps   Commonly known as: FLOMAX   Take 0.4 mg by mouth at bedtime.      traMADol 50 MG tablet   Commonly known as: ULTRAM   Take 50 mg by mouth every 6 (six) hours as needed. For back pain      triamcinolone cream 0.1 %   Commonly known as: KENALOG   Apply 1 application topically daily as needed. For itchy skin        Diagnostic Studies: Dg Hip Complete Right  05/14/2012  *RADIOLOGY REPORT*  Clinical Data: Right anterior hip replacement.  RIGHT HIP - COMPLETE 2+ VIEW  Comparison: None.  Findings: 2 intraoperative views.  These demonstrate placement of a right hip arthroplasty, without acute hardware complication.  IMPRESSION: Intraoperative imaging of right hip arthroplasty.   Original Report Authenticated By: Consuello Bossier, M.D.  Dg Pelvis Portable  05/14/2012  *RADIOLOGY REPORT*  Clinical Data: Postop right hip replacement  PORTABLE PELVIS  Comparison: Pelvis of right hip films of 04/17/2012  Findings: A portable view of the pelvis shows a right hip replacement.  The femoral and acetabular complexes appear to  be in good position.  No acute bony abnormality is seen.  Hardware for posterior lumbar spine fusion is noted.  IMPRESSION: Right total hip replacement.  No complicating features.   Original Report Authenticated By: Juline Patch, M.D.    Dg Hip Portable 1 View Right  05/14/2012  *RADIOLOGY REPORT*  Clinical Data: Postop right hip replacement  PORTABLE RIGHT HIP - 1 VIEW  Comparison: Intraoperative C-arm spot films of 05/14/2012  Findings: A portable lateral view of the right hip shows the femoral and acetabular components to be in good position and alignment.  No acute fracture is seen.  IMPRESSION: Right total hip replacement in good position.   Original Report Authenticated By: Juline Patch, M.D.    Dg C-arm 1-60 Min-no Report  05/14/2012  CLINICAL DATA: surgery   C-ARM 1-60 MINUTES  Fluoroscopy was utilized by the requesting physician.  No radiographic  interpretation.      Disposition: 01-Home or Self Care      Discharge Orders    Future Orders Please Complete By Expires   Diet - low sodium heart healthy      Call MD / Call 911      Comments:   If you experience chest pain or shortness of breath, CALL 911 and be transported to the hospital emergency room.  If you develope a fever above 101 F, pus (white drainage) or increased drainage or redness at the wound, or calf pain, call your surgeon's office.   Constipation Prevention      Comments:   Drink plenty of fluids.  Prune juice may be helpful.  You may use a stool softener, such as Colace (over the counter) 100 mg twice a day.  Use MiraLax (over the counter) for constipation as needed.   Increase activity slowly as tolerated      Discharge instructions      Comments:   You can get your actual incisions wet starting this coming Sunday; then dry dressing daily. Expect right thigh/leg/ankle swelling. Increase your activities as comfort allows.   Follow the hip precautions as taught in Physical Therapy      Discharge patient           Follow-up Information    Follow up with Kathryne Hitch, MD. In 2 weeks.   Contact information:   [address] [place] [address] [phone]           Signed: Kathryne Hitch 05/16/2012, 7:28 AM

## 2012-05-16 NOTE — Progress Notes (Signed)
Subjective: 2 Days Post-Op Procedure(s) (LRB): TOTAL HIP ARTHROPLASTY ANTERIOR APPROACH (Right) REMOVAL GANGLION OF WRIST (Right) Patient reports pain as mild.    Objective: Vital signs in last 24 hours: Temp:  [97.6 F (36.4 C)-98.2 F (36.8 C)] 98 F (36.7 C) (10/24 0655) Pulse Rate:  [75-87] 80  (10/24 0655) Resp:  [15-19] 16  (10/24 0655) BP: (95-134)/(60-85) 114/85 mmHg (10/24 0655) SpO2:  [94 %-99 %] 95 % (10/24 0655)  Intake/Output from previous day: 10/23 0701 - 10/24 0700 In: 1355.9 [P.O.:840; I.V.:515.9] Out: 3975 [Urine:3975] Intake/Output this shift:     Basename 05/16/12 0455 05/15/12 0445  HGB 9.8* 9.8*    Basename 05/16/12 0455 05/15/12 0445  WBC 6.6 6.0  RBC 3.30* 3.28*  HCT 28.4* 28.4*  PLT 136* 142*    Basename 05/15/12 0445  NA 134*  K 4.4  CL 99  CO2 27  BUN 24*  CREATININE 1.10  GLUCOSE 119*  CALCIUM 8.1*    Basename 05/14/12 1251  LABPT --  INR 0.98    Sensation intact distally Intact pulses distally Dorsiflexion/Plantar flexion intact Incision: scant drainage No cellulitis present  Assessment/Plan: 2 Days Post-Op Procedure(s) (LRB): TOTAL HIP ARTHROPLASTY ANTERIOR APPROACH (Right) REMOVAL GANGLION OF WRIST (Right) Discharge home with home health  Kathryne Hitch 05/16/2012, 7:23 AM

## 2012-05-16 NOTE — Progress Notes (Signed)
Physical Therapy Treatment Patient Details Name: Joshua Lopez MRN: [id number] DOB: 1930-04-15 Today's Date: 05/16/2012 Time: 4098-1191 PT Time Calculation (min): 29 min  PT Assessment / Plan / Recommendation Comments on Treatment Session       Follow Up Recommendations  Home health PT     Does the patient have the potential to tolerate intense rehabilitation     Barriers to Discharge        Equipment Recommendations  None recommended by OT;None recommended by PT    Recommendations for Other Services OT consult  Frequency 7X/week   Plan Discharge plan remains appropriate    Precautions / Restrictions Precautions Precautions: Fall Restrictions Weight Bearing Restrictions: No Other Position/Activity Restrictions: WBAT   Pertinent Vitals/Pain 4-5/10; premedicated; ice pack provided    Mobility  Transfers Transfers: Sit to Stand;Stand to Sit Sit to Stand: 4: Min guard Stand to Sit: 4: Min guard Details for Transfer Assistance: cues for use of UEs and for LE management Ambulation/Gait Ambulation/Gait Assistance: 4: Min guard;5: Supervision Ambulation Distance (Feet): 200 Feet Assistive device: Rolling walker Ambulation/Gait Assistance Details: cues for posture and position from RW Gait Pattern: Step-to pattern;Step-through pattern    Exercises Total Joint Exercises Ankle Circles/Pumps: AROM;Supine;Both;20 reps Quad Sets: AROM;Supine;Both;15 reps Heel Slides: AAROM;Supine;Right;20 reps Hip ABduction/ADduction: AAROM;Right;Supine;20 reps   PT Diagnosis:    PT Problem List:   PT Treatment Interventions:     PT Goals Acute Rehab PT Goals PT Goal Formulation: With patient Time For Goal Achievement: 05/21/12 Potential to Achieve Goals: Good Pt will go Supine/Side to Sit: with supervision PT Goal: Supine/Side to Sit - Progress: Progressing toward goal Pt will go Sit to Supine/Side: with supervision PT Goal: Sit to Supine/Side - Progress: Progressing toward  goal Pt will go Sit to Stand: with supervision PT Goal: Sit to Stand - Progress: Progressing toward goal Pt will go Stand to Sit: with supervision PT Goal: Stand to Sit - Progress: Progressing toward goal Pt will Ambulate: >150 feet;with supervision;with rolling walker PT Goal: Ambulate - Progress: Progressing toward goal  Visit Information  Last PT Received On: 05/16/12 Assistance Needed: +1    Subjective Data  Subjective: It hurts a little more than yesterday but its getting better as I move Patient Stated Goal: Resume previous lifestyle with decreased pain   Cognition  Overall Cognitive Status: Appears within functional limits for tasks assessed/performed Arousal/Alertness: Awake/alert Orientation Level: Appears intact for tasks assessed Behavior During Session: [facility] for tasks performed    Balance     End of Session PT - End of Session Equipment Utilized During Treatment: Gait belt Activity Tolerance: Patient tolerated treatment well Patient left: with call bell/phone within reach;with nursing in room;in bed Nurse Communication: Mobility status   GP     Joshua Lopez 05/16/2012, 8:36 AM

## 2012-05-16 NOTE — Progress Notes (Signed)
  CARE MANAGEMENT NOTE 05/16/2012  Patient:  Joshua Lopez, Joshua Lopez   Account Number:  [id number]  Date Initiated:  05/15/2012  Documentation initiated by:  Colleen Can  Subjective/Objective Assessment:   DX SEVERE OSTEOARTHRITIS RIGHT HIP, RT WRIST GANGLION CYST; TOTAL HIP REPLACEMNT -ANTERIOR APPROACH, REMOVAL GANGLION CYST     Action/Plan:   CM spoke with patient. Plans are for patient to return to his home in [place] where spouse will be caregiver. He is requesting specific physical therapist and wants [facility] services. Already has DME -rw,shower chair   Anticipated DC Date:  05/15/2012   Anticipated DC Plan:  HOME W HOME HEALTH SERVICES  In-house referral  Clinical Social Worker      DC Associate Professor  CM consult      [facility] Choice  HOME HEALTH   Choice offered to / List presented to:  C-1 Patient   DME arranged  NA      DME agency  NA     HH arranged  HH-2 PT      [facility] agency  [facility]   Status of service:  Completed, signed off Medicare Important Message given?  NA - LOS <3 / Initial given by admissions (If response is "NO", the following Medicare IM given date fields will be blank) Date Medicare IM given:   Date Additional Medicare IM given:    Discharge Disposition:  HOME W HOME HEALTH SERVICES  Per UR Regulation:  Reviewed for med. necessity/level of care/duration of stay      Comments:  05/16/2012 [name] BSN CCM [phone] Pt discharge today with [facility] services in place. Services to start tomorrow 05/17/2012. [facility] intake-karen notified of patient's discharge.

## 2012-05-17 DIAGNOSIS — Z96649 Presence of unspecified artificial hip joint: Secondary | ICD-10-CM | POA: Diagnosis not present

## 2012-05-17 DIAGNOSIS — IMO0001 Reserved for inherently not codable concepts without codable children: Secondary | ICD-10-CM | POA: Diagnosis not present

## 2012-05-17 DIAGNOSIS — I10 Essential (primary) hypertension: Secondary | ICD-10-CM | POA: Diagnosis not present

## 2012-05-17 DIAGNOSIS — M169 Osteoarthritis of hip, unspecified: Secondary | ICD-10-CM | POA: Diagnosis not present

## 2012-05-17 DIAGNOSIS — Z471 Aftercare following joint replacement surgery: Secondary | ICD-10-CM | POA: Diagnosis not present

## 2012-05-17 DIAGNOSIS — R269 Unspecified abnormalities of gait and mobility: Secondary | ICD-10-CM | POA: Diagnosis not present

## 2012-05-20 DIAGNOSIS — Z96649 Presence of unspecified artificial hip joint: Secondary | ICD-10-CM | POA: Diagnosis not present

## 2012-05-20 DIAGNOSIS — R269 Unspecified abnormalities of gait and mobility: Secondary | ICD-10-CM | POA: Diagnosis not present

## 2012-05-20 DIAGNOSIS — M169 Osteoarthritis of hip, unspecified: Secondary | ICD-10-CM | POA: Diagnosis not present

## 2012-05-20 DIAGNOSIS — Z471 Aftercare following joint replacement surgery: Secondary | ICD-10-CM | POA: Diagnosis not present

## 2012-05-20 DIAGNOSIS — I10 Essential (primary) hypertension: Secondary | ICD-10-CM | POA: Diagnosis not present

## 2012-05-20 DIAGNOSIS — IMO0001 Reserved for inherently not codable concepts without codable children: Secondary | ICD-10-CM | POA: Diagnosis not present

## 2012-05-22 DIAGNOSIS — R269 Unspecified abnormalities of gait and mobility: Secondary | ICD-10-CM | POA: Diagnosis not present

## 2012-05-22 DIAGNOSIS — Z96649 Presence of unspecified artificial hip joint: Secondary | ICD-10-CM | POA: Diagnosis not present

## 2012-05-22 DIAGNOSIS — IMO0001 Reserved for inherently not codable concepts without codable children: Secondary | ICD-10-CM | POA: Diagnosis not present

## 2012-05-22 DIAGNOSIS — Z471 Aftercare following joint replacement surgery: Secondary | ICD-10-CM | POA: Diagnosis not present

## 2012-05-22 DIAGNOSIS — I10 Essential (primary) hypertension: Secondary | ICD-10-CM | POA: Diagnosis not present

## 2012-05-22 DIAGNOSIS — M169 Osteoarthritis of hip, unspecified: Secondary | ICD-10-CM | POA: Diagnosis not present

## 2012-05-24 DIAGNOSIS — M169 Osteoarthritis of hip, unspecified: Secondary | ICD-10-CM | POA: Diagnosis not present

## 2012-05-24 DIAGNOSIS — Z471 Aftercare following joint replacement surgery: Secondary | ICD-10-CM | POA: Diagnosis not present

## 2012-05-24 DIAGNOSIS — R269 Unspecified abnormalities of gait and mobility: Secondary | ICD-10-CM | POA: Diagnosis not present

## 2012-05-24 DIAGNOSIS — I10 Essential (primary) hypertension: Secondary | ICD-10-CM | POA: Diagnosis not present

## 2012-05-24 DIAGNOSIS — IMO0001 Reserved for inherently not codable concepts without codable children: Secondary | ICD-10-CM | POA: Diagnosis not present

## 2012-05-24 DIAGNOSIS — Z96649 Presence of unspecified artificial hip joint: Secondary | ICD-10-CM | POA: Diagnosis not present

## 2012-05-27 DIAGNOSIS — Z471 Aftercare following joint replacement surgery: Secondary | ICD-10-CM | POA: Diagnosis not present

## 2012-05-27 DIAGNOSIS — I10 Essential (primary) hypertension: Secondary | ICD-10-CM | POA: Diagnosis not present

## 2012-05-27 DIAGNOSIS — IMO0001 Reserved for inherently not codable concepts without codable children: Secondary | ICD-10-CM | POA: Diagnosis not present

## 2012-05-27 DIAGNOSIS — M169 Osteoarthritis of hip, unspecified: Secondary | ICD-10-CM | POA: Diagnosis not present

## 2012-05-27 DIAGNOSIS — R269 Unspecified abnormalities of gait and mobility: Secondary | ICD-10-CM | POA: Diagnosis not present

## 2012-05-27 DIAGNOSIS — Z96649 Presence of unspecified artificial hip joint: Secondary | ICD-10-CM | POA: Diagnosis not present

## 2012-05-28 DIAGNOSIS — M25559 Pain in unspecified hip: Secondary | ICD-10-CM | POA: Diagnosis not present

## 2012-05-29 DIAGNOSIS — IMO0001 Reserved for inherently not codable concepts without codable children: Secondary | ICD-10-CM | POA: Diagnosis not present

## 2012-05-29 DIAGNOSIS — Z96649 Presence of unspecified artificial hip joint: Secondary | ICD-10-CM | POA: Diagnosis not present

## 2012-05-29 DIAGNOSIS — I10 Essential (primary) hypertension: Secondary | ICD-10-CM | POA: Diagnosis not present

## 2012-05-29 DIAGNOSIS — M169 Osteoarthritis of hip, unspecified: Secondary | ICD-10-CM | POA: Diagnosis not present

## 2012-05-29 DIAGNOSIS — Z471 Aftercare following joint replacement surgery: Secondary | ICD-10-CM | POA: Diagnosis not present

## 2012-05-29 DIAGNOSIS — R269 Unspecified abnormalities of gait and mobility: Secondary | ICD-10-CM | POA: Diagnosis not present

## 2012-05-31 DIAGNOSIS — Z471 Aftercare following joint replacement surgery: Secondary | ICD-10-CM | POA: Diagnosis not present

## 2012-05-31 DIAGNOSIS — R269 Unspecified abnormalities of gait and mobility: Secondary | ICD-10-CM | POA: Diagnosis not present

## 2012-05-31 DIAGNOSIS — Z96649 Presence of unspecified artificial hip joint: Secondary | ICD-10-CM | POA: Diagnosis not present

## 2012-05-31 DIAGNOSIS — I10 Essential (primary) hypertension: Secondary | ICD-10-CM | POA: Diagnosis not present

## 2012-05-31 DIAGNOSIS — IMO0001 Reserved for inherently not codable concepts without codable children: Secondary | ICD-10-CM | POA: Diagnosis not present

## 2012-05-31 DIAGNOSIS — M169 Osteoarthritis of hip, unspecified: Secondary | ICD-10-CM | POA: Diagnosis not present

## 2012-06-05 ENCOUNTER — Telehealth: Payer: Self-pay | Admitting: Internal Medicine

## 2012-06-05 NOTE — Telephone Encounter (Signed)
Cheaper therapeutically equivalent prescription medication options for the generic Singulair (Montelukast) may be available from  [facility] at [phone] or   [facility] [place] [phone] (toll-free). If insurance will not cover this agent & these 2 sources are not options; he would need followup office visit to discuss other medication choices

## 2012-06-05 NOTE — Telephone Encounter (Signed)
Discussed with patient and he said he really did not understand what was going on and his wife handles everything. She is currently at church and he would like for someone to call her back in the morning to discuss. I made him aware we would do so.     KP

## 2012-06-05 NOTE — Telephone Encounter (Signed)
Please advise 

## 2012-06-05 NOTE — Telephone Encounter (Signed)
Caller: Faye/Spouse; Patient Name: Joshua Lopez; PCP: Marga Melnick; Best Callback Phone Number: [phone] OR  [phone]. [facility] [employer]) called about his Singulair; phone number: [phone] - reference number [id number]. Reports that it seems like the insurance company wants him to take something other than Singulair. Reports patient takes this medication every day at bedtime. Reports patient has 14 doses left at this time. Wife wants to figure this out before it is time to get a refill of this medication. Reports patient gets the generic form of his medications. PLEASE CALL WIFE AND DISCUSS CHANGES THAT NEED TO BE MADE. Thanks.

## 2012-06-06 NOTE — Telephone Encounter (Signed)
Spoke with patient's wife, she will contact [facility] and [place] pharmacy and call me back with an update

## 2012-06-07 NOTE — Telephone Encounter (Signed)
Spoke with patient's wife and she contacted [place] [facility]: 58.95 (3 month), [facility] $75 (100 pills). Patient will come in on Monday 06/10/12 @ 10:00 am

## 2012-06-07 NOTE — Telephone Encounter (Signed)
Called Danella Sensing (patient's wife), she was unavailable. I was told to call back after 3:30 pm

## 2012-06-10 ENCOUNTER — Encounter: Payer: Self-pay | Admitting: Internal Medicine

## 2012-06-10 ENCOUNTER — Other Ambulatory Visit: Payer: Self-pay | Admitting: Internal Medicine

## 2012-06-10 ENCOUNTER — Ambulatory Visit (INDEPENDENT_AMBULATORY_CARE_PROVIDER_SITE_OTHER): Payer: Medicare Other | Admitting: Internal Medicine

## 2012-06-10 VITALS — BP 142/80 | HR 87 | Wt 211.6 lb

## 2012-06-10 DIAGNOSIS — Z23 Encounter for immunization: Secondary | ICD-10-CM | POA: Diagnosis not present

## 2012-06-10 DIAGNOSIS — J309 Allergic rhinitis, unspecified: Secondary | ICD-10-CM

## 2012-06-10 DIAGNOSIS — J302 Other seasonal allergic rhinitis: Secondary | ICD-10-CM

## 2012-06-10 MED ORDER — FLUTICASONE PROPIONATE 50 MCG/ACT NA SUSP: NASAL | Status: DC

## 2012-06-10 NOTE — Patient Instructions (Addendum)
The best exercises for the low back include freestyle swimming, stretch aerobics, and yoga.   Plain Mucinex for thick secretions ;force NON dairy fluids . Use a Neti pot daily as needed for sinus congestion; going from open side to congested side . Nasal cleansing in the shower as discussed. Make sure that all residual soap is removed to prevent irritation. Fluticasone 1 spray in each nostril twice a day as needed. Use the "crossover" technique as discussed. Plain Allegra 160 daily as needed for itchy eyes & sneezing.

## 2012-06-10 NOTE — Progress Notes (Signed)
Subjective:    Patient ID: Joshua Lopez, male    DOB: 09-17-1929, 78 y.o.   MRN: [id number]  HPI He's been contacted by the company that manages his part D. Medicare drug program. They're requesting that he come off generic Singulair.  He has no history of asthma. He does have seasonal rhinitis manifested as congestion without associated itchy, watery eyes or sneezing. He has had no recent symptoms   Review of Systems  He specifically denies frontal headache, facial pain, nasal purulence, dental pain, sore throat, shortness of breath, cough, or wheezing.     Objective:   Physical Exam General appearance:good health ;well nourished; no acute distress or increased work of breathing is present.  No  lymphadenopathy about the head, neck, or axilla noted.   Eyes: No conjunctival inflammation or lid edema is present.   Ears:  External ear exam shows no significant lesions or deformities.  Otoscopic examination reveals clear canals, tympanic membranes are intact bilaterally without bulging, retraction, inflammation or discharge.  Nose:  External nasal examination shows no deformity or inflammation. Nasal mucosa are pink and moist without lesions or exudates. Slight R  septal  deviation.No obstruction to airflow.   Oral exam: Dental hygiene is good; lips and gums are healthy appearing.There is no oropharyngeal erythema or exudate noted.   Neck:  No deformities,  masses, or tenderness noted.     Heart:  Normal rate and regular rhythm. S1 and S2 normal without gallop,  click, rub or other extra sounds. Grade 1/6 systolic murmur   Lungs:Chest clear to auscultation; no wheezes, rhonchi,rales ,or rubs present.No increased work of breathing.    Extremities:  No cyanosis, edema, or clubbing  noted    Skin: Warm & dry           Assessment & Plan:   #1 seasonal rhinitis, quiescent Plan: See orders and recommendations

## 2012-06-11 NOTE — Telephone Encounter (Signed)
Rx sent.    MW 

## 2012-07-09 ENCOUNTER — Other Ambulatory Visit: Payer: Self-pay | Admitting: Internal Medicine

## 2012-07-09 NOTE — Telephone Encounter (Signed)
Pt wants prescriptions to go to a new place and needs to talk to you about it.

## 2012-07-10 MED ORDER — LOSARTAN POTASSIUM-HCTZ 100-12.5 MG PO TABS: 1.0000 | ORAL_TABLET | Freq: Every morning | ORAL | Status: DC

## 2012-07-10 MED ORDER — PANTOPRAZOLE SODIUM 40 MG PO TBEC: 40.0000 mg | DELAYED_RELEASE_TABLET | Freq: Every day | ORAL | Status: DC

## 2012-07-10 MED ORDER — AMLODIPINE BESYLATE 5 MG PO TABS: 5.0000 mg | ORAL_TABLET | Freq: Every morning | ORAL | Status: DC

## 2012-07-10 MED ORDER — PRAVASTATIN SODIUM 20 MG PO TABS: 20.0000 mg | ORAL_TABLET | Freq: Every day | ORAL | Status: DC

## 2012-07-10 NOTE — Telephone Encounter (Signed)
Spoke with Danella Sensing (patient's wife), patient will now use [facility] as his primary pharmacy. Patient needs all rx's mailed

## 2012-07-12 DIAGNOSIS — M545 Low back pain, unspecified: Secondary | ICD-10-CM | POA: Diagnosis not present

## 2012-07-18 ENCOUNTER — Other Ambulatory Visit: Payer: Self-pay | Admitting: Internal Medicine

## 2012-07-30 DIAGNOSIS — M169 Osteoarthritis of hip, unspecified: Secondary | ICD-10-CM | POA: Diagnosis not present

## 2012-08-05 ENCOUNTER — Telehealth: Payer: Self-pay | Admitting: Internal Medicine

## 2012-08-05 NOTE — Telephone Encounter (Signed)
Hopp please advise  

## 2012-08-05 NOTE — Telephone Encounter (Signed)
Cheaper therapeutically equivalent prescription medication option such as generic Singulair 10 mg (Monteluclast ) may be available from    [facility] [place] [phone] (toll-free) . Please check with your insurance to see whether they have a preferred  preferred agent in lieu of Singulair.

## 2012-08-05 NOTE — Telephone Encounter (Signed)
Patient's wife called stating the pt was taken off singular bc it was too expensive with their new insurance. Patient's allergy symptoms have returned and she would like Dr. Alwyn Ren to prescribe him something else. Pt uses [facility] and pts wife states they need the patient's name, DOB, address, and [employer] ID # ([id number]) on the prescription.

## 2012-08-05 NOTE — Telephone Encounter (Signed)
I reviewed patient's medication list and Singulair is not on med list, I called patient to discuss, left message on cell phone.

## 2012-08-08 ENCOUNTER — Telehealth: Payer: Self-pay | Admitting: Internal Medicine

## 2012-08-08 MED ORDER — MONTELUKAST SODIUM 10 MG PO TABS: 10.0000 mg | ORAL_TABLET | Freq: Every day | ORAL | Status: DC

## 2012-08-08 NOTE — Telephone Encounter (Signed)
Spoke with patient's wife, patient wife states she tried [facility] [place]. Mrs.Strout will call insurance company and get back in touch with [place]

## 2012-08-08 NOTE — Telephone Encounter (Signed)
Pts wife spoke w/ins co and they said montelukast (generic for singulair). Pt needs 90 day supply sent to [facility]. Name, dob, member id, address all need to be on the rx. Info is listed below.

## 2012-08-08 NOTE — Telephone Encounter (Signed)
Error. BC °

## 2012-08-08 NOTE — Telephone Encounter (Signed)
Patient's wife returned your call. CB# [phone]

## 2012-08-08 NOTE — Telephone Encounter (Signed)
RX sent

## 2012-09-23 ENCOUNTER — Telehealth: Payer: Self-pay | Admitting: *Deleted

## 2012-09-23 NOTE — Telephone Encounter (Signed)
Discuss with patient and his wife

## 2012-09-23 NOTE — Telephone Encounter (Signed)
Osteo Bioflex should not raise BP but it may increase cholesterol

## 2012-09-23 NOTE — Telephone Encounter (Signed)
Pt would like to know if osteo Bi-Flex could cause BP to become elevated.Please advise

## 2012-09-23 NOTE — Telephone Encounter (Signed)
This is not a side effect that I am aware of.  Pain can increase BP though

## 2012-09-24 NOTE — Telephone Encounter (Signed)
Tried to call Pt no answer or VM will try again later.   09/23/2012 9:20 PM Pecola Lawless, MD   Patient Calls Comment: Minimal Blood Pressure Goal= AVERAGE < 140/90; Ideal is an AVERAGE < 135/85. This AVERAGE should be calculated from @ least 5-7 BP readings taken @ different times of day on different days of week. You should not respond to isolated BP readings , but rather the AVERAGE for that week .Please bring your blood pressure cuff to office visits to verify that it is reliable.It can also be checked against the blood pressure device at the pharmacy. Finger or wrist cuffs are not dependable; an arm cuff is.

## 2012-10-10 ENCOUNTER — Encounter (INDEPENDENT_AMBULATORY_CARE_PROVIDER_SITE_OTHER): Payer: Medicare Other

## 2012-10-10 DIAGNOSIS — I6529 Occlusion and stenosis of unspecified carotid artery: Secondary | ICD-10-CM | POA: Diagnosis not present

## 2012-10-11 DIAGNOSIS — N320 Bladder-neck obstruction: Secondary | ICD-10-CM | POA: Diagnosis not present

## 2012-10-11 DIAGNOSIS — R972 Elevated prostate specific antigen [PSA]: Secondary | ICD-10-CM | POA: Diagnosis not present

## 2012-10-11 DIAGNOSIS — N138 Other obstructive and reflux uropathy: Secondary | ICD-10-CM | POA: Diagnosis not present

## 2012-10-11 DIAGNOSIS — N401 Enlarged prostate with lower urinary tract symptoms: Secondary | ICD-10-CM | POA: Diagnosis not present

## 2012-10-28 DIAGNOSIS — M6740 Ganglion, unspecified site: Secondary | ICD-10-CM | POA: Diagnosis not present

## 2012-10-28 DIAGNOSIS — M169 Osteoarthritis of hip, unspecified: Secondary | ICD-10-CM | POA: Diagnosis not present

## 2012-12-25 ENCOUNTER — Other Ambulatory Visit: Payer: Self-pay | Admitting: General Practice

## 2012-12-25 MED ORDER — MONTELUKAST SODIUM 10 MG PO TABS: 10.0000 mg | ORAL_TABLET | Freq: Every day | ORAL | Status: DC

## 2012-12-25 NOTE — Telephone Encounter (Signed)
Med filled.  

## 2013-02-19 ENCOUNTER — Other Ambulatory Visit: Payer: Self-pay | Admitting: *Deleted

## 2013-02-19 ENCOUNTER — Telehealth: Payer: Self-pay | Admitting: *Deleted

## 2013-02-19 MED ORDER — PANTOPRAZOLE SODIUM 40 MG PO TBEC: DELAYED_RELEASE_TABLET | ORAL | Status: DC

## 2013-02-19 MED ORDER — PRAVASTATIN SODIUM 20 MG PO TABS: 20.0000 mg | ORAL_TABLET | Freq: Every day | ORAL | Status: DC

## 2013-02-19 MED ORDER — AMLODIPINE BESYLATE 5 MG PO TABS: 5.0000 mg | ORAL_TABLET | Freq: Every morning | ORAL | Status: DC

## 2013-02-19 NOTE — Telephone Encounter (Signed)
Rx has been filled 02/19/13

## 2013-02-20 ENCOUNTER — Other Ambulatory Visit: Payer: Self-pay | Admitting: *Deleted

## 2013-02-20 MED ORDER — LOSARTAN POTASSIUM-HCTZ 100-12.5 MG PO TABS: ORAL_TABLET | ORAL | Status: DC

## 2013-03-20 ENCOUNTER — Ambulatory Visit (INDEPENDENT_AMBULATORY_CARE_PROVIDER_SITE_OTHER): Payer: Medicare Other | Admitting: Family Medicine

## 2013-03-20 ENCOUNTER — Encounter: Payer: Self-pay | Admitting: Family Medicine

## 2013-03-20 VITALS — BP 122/70 | HR 61 | Temp 97.5°F | Wt 211.2 lb

## 2013-03-20 DIAGNOSIS — R279 Unspecified lack of coordination: Secondary | ICD-10-CM | POA: Diagnosis not present

## 2013-03-20 DIAGNOSIS — R42 Dizziness and giddiness: Secondary | ICD-10-CM

## 2013-03-20 DIAGNOSIS — R269 Unspecified abnormalities of gait and mobility: Secondary | ICD-10-CM

## 2013-03-20 LAB — CBC WITH DIFFERENTIAL/PLATELET
Basophils Absolute: 0 10*3/uL (ref 0.0–0.1)
Basophils Relative: 0.3 % (ref 0.0–3.0)
Eosinophils Absolute: 0 10*3/uL (ref 0.0–0.7)
Eosinophils Relative: 0.3 % (ref 0.0–5.0)
HCT: 39.8 % (ref 39.0–52.0)
Hemoglobin: 13.6 g/dL (ref 13.0–17.0)
Lymphocytes Relative: 15.8 % (ref 12.0–46.0)
Lymphs Abs: 1 10*3/uL (ref 0.7–4.0)
MCHC: 34.2 g/dL (ref 30.0–36.0)
MCV: 90.6 fl (ref 78.0–100.0)
Monocytes Absolute: 0.2 10*3/uL (ref 0.1–1.0)
Monocytes Relative: 3.6 % (ref 3.0–12.0)
Neutro Abs: 4.8 10*3/uL (ref 1.4–7.7)
Neutrophils Relative %: 80 % — ABNORMAL HIGH (ref 43.0–77.0)
Platelets: 185 10*3/uL (ref 150.0–400.0)
RBC: 4.4 Mil/uL (ref 4.22–5.81)
RDW: 14.4 % (ref 11.5–14.6)
WBC: 6 10*3/uL (ref 4.5–10.5)

## 2013-03-20 LAB — TSH: TSH: 0.47 u[IU]/mL (ref 0.35–5.50)

## 2013-03-20 LAB — BASIC METABOLIC PANEL
BUN: 26 mg/dL — ABNORMAL HIGH (ref 6–23)
CO2: 28 mEq/L (ref 19–32)
Calcium: 9.3 mg/dL (ref 8.4–10.5)
Chloride: 101 mEq/L (ref 96–112)
Creatinine, Ser: 1.2 mg/dL (ref 0.4–1.5)
GFR: 64.89 mL/min (ref >60.00)
Glucose, Bld: 110 mg/dL — ABNORMAL HIGH (ref 70–99)
Potassium: 4 mEq/L (ref 3.5–5.1)
Sodium: 137 mEq/L (ref 135–145)

## 2013-03-20 LAB — HEPATIC FUNCTION PANEL
ALT: 18 U/L (ref 0–53)
AST: 16 U/L (ref 0–37)
Albumin: 4.3 g/dL (ref 3.5–5.2)
Alkaline Phosphatase: 71 U/L (ref 39–117)
Bilirubin, Direct: 0.1 mg/dL (ref 0.0–0.3)
Total Bilirubin: 0.9 mg/dL (ref 0.3–1.2)
Total Protein: 7.2 g/dL (ref 6.0–8.3)

## 2013-03-20 MED ORDER — MECLIZINE HCL 25 MG PO TABS: 25.0000 mg | ORAL_TABLET | Freq: Three times a day (TID) | ORAL | Status: DC | PRN

## 2013-03-20 NOTE — Patient Instructions (Addendum)

## 2013-03-20 NOTE — Progress Notes (Signed)
  Subjective:     Joshua Lopez is a 79 y.o. male who presents for evaluation of dizziness. The symptoms started several hours ago ago and are worse. The attacks occur 1-2 x a week and last a few seconds. --however episode this am still has not resolved.  Positions that worsen symptoms: bending over, rolling in bed to the left, rolling in bed to the right and take a deep breathe. Previous workup/treatments: none. Associated ear symptoms: none. Associated CNS symptoms: visual floaters. Recent infections: none. Head trauma: denied. Drug ingestion: none. Noise exposure: no occupational exposure. Family history: daughter with epilepsy.  The following portions of the patient's history were reviewed and updated as appropriate: allergies, current medications, past family history, past medical history, past social history, past surgical history and problem list.  Review of Systems Pertinent items are noted in HPI.    Objective:    BP 122/70  Pulse 61  Temp(Src) 97.5 F (36.4 C) (Oral)  Wt 211 lb 3.2 oz (95.8 kg)  BMI 31.17 kg/m2  SpO2 95% General appearance: alert, cooperative, appears stated age and no distress Ears: normal TM's and external ear canals both ears Nose: Nares normal. Septum midline. Mucosa normal. No drainage or sinus tenderness., no discharge, turbinates normal, no sinus tenderness Throat: lips, mucosa, and tongue normal; teeth and gums normal Neck: no adenopathy, no carotid bruit, no JVD, supple, symmetrical, trachea midline and thyroid not enlarged, symmetric, no tenderness/mass/nodules Lungs: clear to auscultation bilaterally Heart: S1, S2 normal Extremities: extremities normal, atraumatic, no cyanosis or edema Neurologic: Alert and oriented X 3, normal strength and tone. Normal symmetric reflexes. Normal coordination and gait      Assessment:    Vertigo    Plan:    Meclizine per medication orders. Referral to Neurology. check labs and hearing  rto prn

## 2013-04-01 DIAGNOSIS — H903 Sensorineural hearing loss, bilateral: Secondary | ICD-10-CM | POA: Diagnosis not present

## 2013-04-09 ENCOUNTER — Encounter: Payer: Self-pay | Admitting: Neurology

## 2013-04-09 ENCOUNTER — Ambulatory Visit (INDEPENDENT_AMBULATORY_CARE_PROVIDER_SITE_OTHER): Payer: Medicare Other | Admitting: Neurology

## 2013-04-09 VITALS — BP 108/60 | HR 80 | Temp 97.6°F | Ht 70.0 in | Wt 216.0 lb

## 2013-04-09 DIAGNOSIS — H8110 Benign paroxysmal vertigo, unspecified ear: Secondary | ICD-10-CM

## 2013-04-09 DIAGNOSIS — R42 Dizziness and giddiness: Secondary | ICD-10-CM

## 2013-04-09 MED ORDER — MECLIZINE HCL 25 MG PO TABS: 25.0000 mg | ORAL_TABLET | Freq: Three times a day (TID) | ORAL | Status: DC | PRN

## 2013-04-09 NOTE — Progress Notes (Signed)
NEUROLOGY CONSULTATION NOTE  Joshua Lopez MRN: [id number] DOB: 1930/07/07  Referring provider: Dr. Laury Axon Primary care provider: Dr. Alwyn Ren  Reason for consult:  Dizziness  HISTORY OF PRESENT ILLNESS: Joshua Lopez is an 79 year old right-handed man with HTN, hyperlipidemia, hyperglycemia, arthritis, lumbar stenosis, and BPH who presents for dizziness and incoordination.  He is accompanied by his wife.  Records and images were personally reviewed where available.    He has had spells off and on for 25 years, however it has never persisted so long.  Symptoms for this wave of spells started about 3 weeks ago.  He develops spinning sensation, triggered with change of position, such as bending over, rolling in bed to either side (worse with head turn to the right), and lasting a few seconds.  They occur 1-2x/week.  No visual changes, tinnitus, ear fullness, slurred speech, facial weakness, facial numbness or numbness and weakness of extremities.  On 03/20/13, she presented to Dr. Loreen Freud since symptoms persisted.  His exam was normal and he was prescribed meclizine.  He had his hearing checked last week, which was reportedly unchanged.   03/20/13: TSH 0.47, Na 137, K 4, Cl 101, glucose 110, BUN 26, Cr 1.2, AP 71, AST 16, ALT 18, WBC 6, Hgb 13.6, Hct 39.Marland Kitchen  PAST MEDICAL HISTORY: Past Medical History  Diagnosis Date  . Esophageal stricture   . Hypertension   . BPH (benign prostatic hypertrophy)     Dr. Isabel Caprice monitors  elevated PSA  . Colon polyp     2006 and 2010  . Hyperlipidemia     NMR  2007  . Subclavian steal syndrome 1997  . GERD (gastroesophageal reflux disease)   . Arthritis     "all over"  . Chronic lower back pain 05-10-12    States pain is related to hip    PAST SURGICAL HISTORY: Past Surgical History  Procedure Laterality Date  . Subclavian stent placement  1997    right side  . Prostate biopsy  2000    Dr. Earlene Plater  . Esophageal dilation  2003  . Colonoscopy w/  polypectomy      2006 and 2010; HYPERPLASTIC polyp and  . Ganglion cyst excision  1999    LUE  . Total shoulder arthroplasty  2010    Dr. Dion Saucier; right  . Knee arthroscopy  2001    left  . Joint replacement    . Cataract extraction w/ intraocular lens  implant, bilateral    . Posterior fusion lumbar spine  02/08/12    L2-L4  . Back surgery  05-10-12    lumbar fusion with hardware  . Total hip arthroplasty  05/14/2012    Procedure: TOTAL HIP ARTHROPLASTY ANTERIOR APPROACH;  Surgeon: Kathryne Hitch, MD;  Location: [facility];  Service: Orthopedics;  Laterality: Right;  Right Total Hip Arthroplasty, Anterior Approach (C-Arm)  . Ganglion cyst excision  05/14/2012    Procedure: REMOVAL GANGLION OF WRIST;  Surgeon: Kathryne Hitch, MD;  Location: [facility];  Service: Orthopedics;  Laterality: Right;  Excision Right Wrist Ganglion Cyst    MEDICATIONS: Current Outpatient Prescriptions on File Prior to Visit  Medication Sig Dispense Refill  . acetaminophen (TYLENOL) 500 MG tablet Take 500 mg by mouth every 6 (six) hours as needed. For pain      . amLODipine (NORVASC) 5 MG tablet Take 1 tablet (5 mg total) by mouth every morning.  90 tablet  0  . Calcium Carbonate-Vitamin D (CALTRATE 600+D PO)  Take 1 tablet by mouth 2 (two) times daily.       . Cyanocobalamin 2500 MCG TABS Take 1,250 mcg by mouth every morning.      . docusate sodium (COLACE) 100 MG capsule Take 100 mg by mouth at bedtime as needed.       . fish oil-omega-3 fatty acids 1000 MG capsule Take 1 g by mouth 2 (two) times daily.       . fluticasone (FLONASE) 50 MCG/ACT nasal spray 1 spray in each nostril twice a day as needed. Use the "crossover" technique as discussed  16 g  2  . losartan-hydrochlorothiazide (HYZAAR) 100-12.5 MG per tablet TAKE ONE TABLET BY MOUTH EVERY DAY  90 tablet  1  . montelukast (SINGULAIR) 10 MG tablet Take 1 tablet (10 mg total) by mouth at bedtime. ID Number: ([id number])  90 tablet  1  .  pantoprazole (PROTONIX) 40 MG tablet TAKE ONE TABLET BY MOUTH EVERY DAY 30 MINUTES BEFORE A MEAL  90 tablet  0  . polyethylene glycol (MIRALAX / GLYCOLAX) packet Take 17 g by mouth 2 (two) times daily.       . pravastatin (PRAVACHOL) 20 MG tablet Take 1 tablet (20 mg total) by mouth at bedtime.  90 tablet  0  . SAW PALMETTO, SERENOA REPENS, PO Take 1 tablet by mouth every morning.       . Tamsulosin HCl (FLOMAX) 0.4 MG CAPS Take 0.4 mg by mouth at bedtime.       Marland Kitchen amoxicillin (AMOXIL) 500 MG capsule Take 2,000 mg by mouth once as needed. One hour before dental appointment       No current facility-administered medications on file prior to visit.    ALLERGIES: Allergies  Allergen Reactions  . Aspirin Rash and Other (See Comments)    Dyspnea, eyelid edema, truncal urticaria--Patient tolerates other NSAIDS at home;    FAMILY HISTORY: Family History  Problem Relation Age of Onset  . Sudden death Father     murdered  . Hypertension Mother   . Skin cancer Mother   . Skin cancer Brother   . Hypertension Brother   . Coronary artery disease Brother     CABG  . Colon cancer Paternal Uncle   . Breast cancer Paternal Grandmother   . Breast cancer Paternal Aunt   . Colon cancer Paternal Aunt   . Heart attack Paternal Uncle   . Kidney disease Neg Hx     SOCIAL HISTORY: History   Social History  . Marital Status: Married    Spouse Name: N/A    Number of Children: N/A  . Years of Education: N/A   Occupational History  . Not on file.   Social History Main Topics  . Smoking status: Former Smoker -- 0.50 packs/day for 33 years    Types: Cigarettes    Quit date: 07/24/1973  . Smokeless tobacco: Former Neurosurgeon    Types: Chew    Quit date: 07/24/1974  . Alcohol Use: No  . Drug Use: No  . Sexual Activity: Yes   Other Topics Concern  . Not on file   Social History Narrative  . No narrative on file    REVIEW OF SYSTEMS: Constitutional: No fevers, chills, or sweats, no  generalized fatigue, change in appetite Eyes: No visual changes, double vision, eye pain Ear, nose and throat: No hearing loss, ear pain, nasal congestion, sore throat Cardiovascular: No chest pain, palpitations Respiratory:  No shortness of breath at rest or with  exertion, wheezes GastrointestinaI: No nausea, vomiting, diarrhea, abdominal pain, fecal incontinence Genitourinary:  No dysuria, urinary retention or frequency Musculoskeletal:  No neck pain, back pain Integumentary: No rash, pruritus, skin lesions Neurological: as above Psychiatric: No depression, insomnia, anxiety Endocrine: No palpitations, fatigue, diaphoresis, mood swings, change in appetite, change in weight, increased thirst Hematologic/Lymphatic:  No anemia, purpura, petechiae. Allergic/Immunologic: no itchy/runny eyes, nasal congestion, recent allergic reactions, rashes  PHYSICAL EXAM: Filed Vitals:   04/09/13 1240  BP: 108/60  Pulse: 80  Temp: 97.6 F (36.4 C)   General: No acute distress Head:  Normocephalic/atraumatic Neck: supple, no paraspinal tenderness, full range of motion Back: No paraspinal tenderness Heart: regular rate and rhythm Lungs: Clear to auscultation bilaterally. Vascular: No carotid bruits. Neurological Exam: Mental status: alert and oriented to person, place, and time, speech fluent and not dysarthric, language intact. Cranial nerves: CN I: not tested CN II: pupils equal, round and reactive to light, visual fields intact, fundi unremarkable. CN III, IV, VI:  full range of motion, no nystagmus, no ptosis CN V: facial sensation intact CN VII: upper and lower face symmetric CN VIII: hearing intact CN IX, X: gag intact, uvula midline CN XI: sternocleidomastoid and trapezius muscles intact CN XII: tongue midline Bulk & Tone: normal, no fasciculations. Motor: 5/5 throughout Sensation: mildly reduced temperature sensation in feet, vibration intact Deep Tendon Reflexes: 1+ throughout,  toes down Finger to nose testing: mild postural tremor, no dysmetria Heel to shin: no dysmetria Gait: normal stride, able to walk in tandem. Romberg negative. Dix-Hallpike test: postive on the right with rotatory nystagmus and reproducible vertigo, lasting a few seconds.  IMPRESSION & PLAN: Benign paroxysmal positional vertigo with right vestibular dysfunction. 1.  Will refill meclizine, but instructed to only take sparingly for most severe attacks 2.  Avoid triggers 3.  Refer to vestibular rehab. 4.  Follow up in 2 months.  Thank you for allowing me to take part in the care of this patient.  Shon Millet, DO  CC:  Marga Melnick, MD  Loreen Freud, DO

## 2013-04-09 NOTE — Patient Instructions (Addendum)
Benign paroxysmal positional vertigo 1.  May take meclizine but only reserve for severe attacks.  Must not be taken frequently. 2.  Avoid triggers 3.  Vestibular rehab 4.  Follow up in two months.  We will refer you to the [facility] located at [address] in [place] for [employer].  They will call you to make the appointment.   [phone].

## 2013-04-14 ENCOUNTER — Ambulatory Visit: Payer: Medicare Other | Attending: Neurology | Admitting: Rehabilitative and Restorative Service Providers"

## 2013-04-14 DIAGNOSIS — H8110 Benign paroxysmal vertigo, unspecified ear: Secondary | ICD-10-CM | POA: Insufficient documentation

## 2013-04-14 DIAGNOSIS — IMO0001 Reserved for inherently not codable concepts without codable children: Secondary | ICD-10-CM | POA: Diagnosis not present

## 2013-04-15 ENCOUNTER — Ambulatory Visit: Payer: Medicare Other | Admitting: Rehabilitative and Restorative Service Providers"

## 2013-04-15 DIAGNOSIS — IMO0001 Reserved for inherently not codable concepts without codable children: Secondary | ICD-10-CM | POA: Diagnosis not present

## 2013-04-15 DIAGNOSIS — H8110 Benign paroxysmal vertigo, unspecified ear: Secondary | ICD-10-CM | POA: Diagnosis not present

## 2013-04-17 ENCOUNTER — Ambulatory Visit: Payer: Medicare Other | Admitting: Rehabilitative and Restorative Service Providers"

## 2013-04-28 ENCOUNTER — Ambulatory Visit: Payer: Medicare Other | Admitting: Rehabilitative and Restorative Service Providers"

## 2013-04-28 DIAGNOSIS — M6740 Ganglion, unspecified site: Secondary | ICD-10-CM | POA: Diagnosis not present

## 2013-04-28 DIAGNOSIS — M169 Osteoarthritis of hip, unspecified: Secondary | ICD-10-CM | POA: Diagnosis not present

## 2013-04-29 ENCOUNTER — Ambulatory Visit: Payer: Medicare Other | Admitting: Rehabilitative and Restorative Service Providers"

## 2013-04-29 ENCOUNTER — Other Ambulatory Visit: Payer: Self-pay | Admitting: *Deleted

## 2013-04-29 MED ORDER — PRAVASTATIN SODIUM 20 MG PO TABS: 20.0000 mg | ORAL_TABLET | Freq: Every day | ORAL | Status: DC

## 2013-04-29 NOTE — Telephone Encounter (Signed)
Pravastatin refill sent to pharmacy

## 2013-05-02 ENCOUNTER — Telehealth: Payer: Self-pay | Admitting: Internal Medicine

## 2013-05-02 MED ORDER — AMLODIPINE BESYLATE 5 MG PO TABS: 5.0000 mg | ORAL_TABLET | Freq: Every morning | ORAL | Status: DC

## 2013-05-02 NOTE — Telephone Encounter (Signed)
Medication refilled for one month. Patient needs OV for additional refills

## 2013-05-02 NOTE — Telephone Encounter (Signed)
Patient wife called and wanted to see did we respond to her husband pharmacy [facility] about his amLODipine (NORVASC) 5 MG tablet because the rx they have is expired.

## 2013-05-12 DIAGNOSIS — H26499 Other secondary cataract, unspecified eye: Secondary | ICD-10-CM | POA: Diagnosis not present

## 2013-05-12 DIAGNOSIS — Z961 Presence of intraocular lens: Secondary | ICD-10-CM | POA: Diagnosis not present

## 2013-05-19 ENCOUNTER — Ambulatory Visit (INDEPENDENT_AMBULATORY_CARE_PROVIDER_SITE_OTHER): Payer: Medicare Other | Admitting: Internal Medicine

## 2013-05-19 ENCOUNTER — Encounter: Payer: Self-pay | Admitting: Internal Medicine

## 2013-05-19 VITALS — BP 130/78 | HR 84 | Temp 97.1°F | Wt 218.6 lb

## 2013-05-19 DIAGNOSIS — Z23 Encounter for immunization: Secondary | ICD-10-CM | POA: Diagnosis not present

## 2013-05-19 DIAGNOSIS — I10 Essential (primary) hypertension: Secondary | ICD-10-CM | POA: Diagnosis not present

## 2013-05-19 DIAGNOSIS — Z0000 Encounter for general adult medical examination without abnormal findings: Secondary | ICD-10-CM

## 2013-05-19 DIAGNOSIS — N400 Enlarged prostate without lower urinary tract symptoms: Secondary | ICD-10-CM

## 2013-05-19 DIAGNOSIS — E782 Mixed hyperlipidemia: Secondary | ICD-10-CM | POA: Diagnosis not present

## 2013-05-19 MED ORDER — AMLODIPINE BESYLATE 5 MG PO TABS: 5.0000 mg | ORAL_TABLET | Freq: Every morning | ORAL | Status: DC

## 2013-05-19 NOTE — Patient Instructions (Signed)
Your next office appointment will be determined based upon review of your pending labs . Those instructions will be transmitted to you  by mail. 

## 2013-05-19 NOTE — Progress Notes (Signed)
Subjective:    Patient ID: Joshua Lopez, male    DOB: June 21, 1930, 79 y.o.   MRN: [id number]  HPI  Medicare Wellness Visit: Psychosocial and medical history were reviewed as required by Medicare (history related to abuse, antisocial behavior , firearm risk). Social history: Caffeine: minimal , Alcohol: no , Tobacco WUJ:WJXB 1975 Exercise:see below Personal safety/fall risk:see neurology evaluation Limitations of activities of daily living:wife puts on socks & washes his back & feet Seatbelt/ smoke alarm use:yes Healthcare Power of Attorney/Living Will status: in place Ophthalmologic exam status:current Hearing evaluation status:current Orientation: Oriented X 3 Memory and recall: slow to remember President; only 1 item recalled. Later he recalled 2 items Spelling or math testing: difficulty with both Depression/anxiety assessment: denied Foreign travel history:2008 [place] Immunization status for influenza/pneumonia/ shingles /tetanus:Flu today Transfusion history: no Preventive health care maintenance status: Colonoscopy as per protocol/standard care: current Dental care:every 6 mos Chart reviewed and updated. Active issues reviewed and addressed as documented below.    Review of Systems A heart healthy diet is NOT followed; unable to exercise due to diffuse DJD . Specifically denied are  chest pain, palpitations, dyspnea, or claudication.  Family history is negative  for premature coronary disease. There is medication compliance with the statin. Significant abdominal symptoms or myalgias denied. Some memory issues related to names & taking pills. BP 118/62 on average @ home.     Objective:   Physical Exam Gen.:  well-nourished in appearance. Alert, appropriate and cooperative throughout exam.Appears younger than stated age  Head: Normocephalic without obvious abnormalities  Eyes: No corneal or conjunctival inflammation noted. Pupils equal round reactive to light and  accommodation.  Extraocular motion intact. No icterus Ears: External  ear exam reveals no significant lesions or deformities.  Hearing is grossly decreased bilaterally. Nose: External nasal exam reveals no deformity or inflammation. Nasal mucosa are pink and moist. No lesions or exudates noted.  Mouth: Oral mucosa and oropharynx reveal no lesions or exudates. Teeth in good repair. Neck: No deformities, masses, or tenderness noted. Range of motion decreased. Thyroid normal. Lungs: Normal respiratory effort; chest expands symmetrically. Lungs are clear to auscultation without rales, wheezes, or increased work of breathing. Heart: Normal rate and rhythm. Normal S1 and S2. No gallop, click, or rub. Grade 1/6 systolic murmur. Abdomen: Bowel sounds normal; abdomen soft and nontender. No masses, organomegaly or hernias noted. Genitalia: As per Urology                                 Musculoskeletal/extremities: No clubbing, cyanosis, or edema noted. Range of motion decreased @ knees .Tone & strength  Normal. Joints  reveal mixed PIP / DIP changes. Nail health good. Able to lie down & sit up w/o help. Negative SLR bilaterally Vascular: Carotid, radial artery, dorsalis pedis and  posterior tibial pulses are full and equal. Asymmetric carotid  bruits present. Neurologic: Alert and oriented x3. Deep tendon reflexes symmetrical but 0- 1/2+.     Skin: Intact without suspicious lesions or rashes. Lymph: No cervical, axillary lymphadenopathy present. Psych: Mood and affect are normal. Normally interactive  Assessment & Plan:  #1 Medicare Wellness Exam; criteria met ; data entered #2 Problem List/Diagnoses reviewed Plan:  Assessments made/ Orders entered

## 2013-05-20 ENCOUNTER — Other Ambulatory Visit: Payer: Medicare Other

## 2013-05-21 DIAGNOSIS — H26499 Other secondary cataract, unspecified eye: Secondary | ICD-10-CM | POA: Diagnosis not present

## 2013-05-22 ENCOUNTER — Other Ambulatory Visit (INDEPENDENT_AMBULATORY_CARE_PROVIDER_SITE_OTHER): Payer: Medicare Other

## 2013-05-22 DIAGNOSIS — E782 Mixed hyperlipidemia: Secondary | ICD-10-CM

## 2013-05-22 LAB — LIPID PANEL
Cholesterol: 136 mg/dL (ref 0–200)
HDL: 46.9 mg/dL (ref >39.00)
LDL Cholesterol: 68 mg/dL (ref 0–99)
Total CHOL/HDL Ratio: 3
Triglycerides: 106 mg/dL (ref 0.0–149.0)
VLDL: 21.2 mg/dL (ref 0.0–40.0)

## 2013-05-28 DIAGNOSIS — H26499 Other secondary cataract, unspecified eye: Secondary | ICD-10-CM | POA: Diagnosis not present

## 2013-06-04 DIAGNOSIS — M19019 Primary osteoarthritis, unspecified shoulder: Secondary | ICD-10-CM | POA: Diagnosis not present

## 2013-06-04 DIAGNOSIS — M1710 Unilateral primary osteoarthritis, unspecified knee: Secondary | ICD-10-CM | POA: Diagnosis not present

## 2013-06-09 ENCOUNTER — Ambulatory Visit (INDEPENDENT_AMBULATORY_CARE_PROVIDER_SITE_OTHER): Payer: Medicare Other | Admitting: Neurology

## 2013-06-09 ENCOUNTER — Encounter: Payer: Self-pay | Admitting: Neurology

## 2013-06-09 VITALS — BP 136/70 | HR 72 | Ht 71.0 in | Wt 220.0 lb

## 2013-06-09 DIAGNOSIS — R42 Dizziness and giddiness: Secondary | ICD-10-CM

## 2013-06-09 DIAGNOSIS — H8110 Benign paroxysmal vertigo, unspecified ear: Secondary | ICD-10-CM | POA: Diagnosis not present

## 2013-06-09 MED ORDER — MECLIZINE HCL 25 MG PO TABS: 25.0000 mg | ORAL_TABLET | Freq: Three times a day (TID) | ORAL | Status: DC | PRN

## 2013-06-09 NOTE — Patient Instructions (Signed)
1.  Continue the exercises you have been doing. 2.  I will refill your meclizine, but take it ONLY SPARINGLY. 3.  No need to follow up with neurology for this condition, unless needed.

## 2013-06-09 NOTE — Progress Notes (Signed)
NEUROLOGY FOLLOW UP OFFICE NOTE  Joshua Lopez [id number]  HISTORY OF PRESENT ILLNESS: Joshua Lopez is an 79 year old right-handed man HTN, hyperlipidemia, hyperglycemia, arthritis, lumbar stenosis, and BPH who follows up for benign paroxysmal positional vertigo.  He is accompanied by his wife.  Records and images were personally reviewed where available.    He has had spells off and on for 25 years, however it has never persisted so long.  Symptoms for this wave of spells started about 3 weeks ago.  He develops spinning sensation, triggered with change of position, such as bending over, rolling in bed to either side (worse with head turn to the right), and lasting a few seconds.  They occur 1-2x/week.  No visual changes, tinnitus, ear fullness, slurred speech, facial weakness, facial numbness or numbness and weakness of extremities.  On 03/20/13, she presented to Dr. Loreen Freud since symptoms persisted.  His exam was normal and he was prescribed meclizine.  He had his hearing checked last week, which was reportedly unchanged.    Last visit, I prescribed vestibular rehab and instructed to take meclizine sparingly.  He has been performing the vestibular exercises that he learned twice a day.  He is "90%" back to normal.  He rarely has a severe attack, for which he reserves the meclizine.  Otherwise he is doing well.  PAST MEDICAL HISTORY: Past Medical History  Diagnosis Date  . Esophageal stricture   . Hypertension   . BPH (benign prostatic hypertrophy)     Dr. Isabel Caprice monitors  elevated PSA  . Colon polyp     2006 and 2010  . Hyperlipidemia     NMR  2007  . Subclavian steal syndrome 1997  . GERD (gastroesophageal reflux disease)   . Arthritis     diffuse  . Chronic lower back pain 05-10-12    States pain is related to hip    MEDICATIONS: Current Outpatient Prescriptions on File Prior to Visit  Medication Sig Dispense Refill  . acetaminophen (TYLENOL) 500 MG tablet Take 500 mg  by mouth every 6 (six) hours as needed. For pain      . amLODipine (NORVASC) 5 MG tablet Take 1 tablet (5 mg total) by mouth every morning.  90 tablet  3  . amoxicillin (AMOXIL) 500 MG capsule Take 2,000 mg by mouth once as needed. One hour before dental appointment      . Calcium Carbonate-Vitamin D (CALTRATE 600+D PO) Take 1 tablet by mouth 2 (two) times daily.       . Cyanocobalamin 2500 MCG TABS Take 1,250 mcg by mouth every morning.      . docusate sodium (COLACE) 100 MG capsule Take 100 mg by mouth at bedtime as needed.       . fish oil-omega-3 fatty acids 1000 MG capsule Take 1 g by mouth 2 (two) times daily.       . fluticasone (FLONASE) 50 MCG/ACT nasal spray 1 spray in each nostril twice a day as needed. Use the "crossover" technique as discussed  16 g  2  . losartan-hydrochlorothiazide (HYZAAR) 100-12.5 MG per tablet TAKE ONE TABLET BY MOUTH EVERY DAY  90 tablet  1  . montelukast (SINGULAIR) 10 MG tablet Take 1 tablet (10 mg total) by mouth at bedtime. ID Number: [id number])  90 tablet  1  . pantoprazole (PROTONIX) 40 MG tablet TAKE ONE TABLET BY MOUTH EVERY DAY 30 MINUTES BEFORE A MEAL  90 tablet  0  . polyethylene  glycol (MIRALAX / GLYCOLAX) packet Take 17 g by mouth 2 (two) times daily.       . pravastatin (PRAVACHOL) 20 MG tablet Take 1 tablet (20 mg total) by mouth at bedtime.  30 tablet  0  . SAW PALMETTO, SERENOA REPENS, PO Take 1 tablet by mouth every morning.       . Tamsulosin HCl (FLOMAX) 0.4 MG CAPS Take 0.4 mg by mouth at bedtime.        No current facility-administered medications on file prior to visit.    ALLERGIES: Allergies  Allergen Reactions  . Aspirin Rash and Other (See Comments)    Dyspnea, eyelid edema, truncal urticaria--Patient tolerates other NSAIDS at home Because of a history of documented adverse serious drug reaction;Medi Alert bracelet  is recommended    FAMILY HISTORY: Family History  Problem Relation Age of Onset  . Sudden death Father      murdered  . Hypertension Mother   . Skin cancer Mother   . Skin cancer Brother   . Hypertension Brother   . Coronary artery disease Brother     CABG  . Colon cancer Paternal Uncle   . Breast cancer Paternal Grandmother   . Breast cancer Paternal Aunt   . Colon cancer Paternal Aunt   . Heart attack Paternal Uncle   . Kidney disease Neg Hx   . Diabetes Neg Hx     SOCIAL HISTORY: History   Social History  . Marital Status: Married    Spouse Name: N/A    Number of Children: N/A  . Years of Education: N/A   Occupational History  . Not on file.   Social History Main Topics  . Smoking status: Former Smoker -- 0.50 packs/day for 33 years    Types: Cigarettes    Quit date: 07/24/1973  . Smokeless tobacco: Former Neurosurgeon    Types: Chew    Quit date: 07/24/1974     Comment: smoked 1949- 1975 , up to 1/3 ppd  . Alcohol Use: No  . Drug Use: No  . Sexual Activity: Yes   Other Topics Concern  . Not on file   Social History Narrative  . No narrative on file    REVIEW OF SYSTEMS: Constitutional: No fevers, chills, or sweats, no generalized fatigue, change in appetite Eyes: No visual changes, double vision, eye pain Ear, nose and throat: No hearing loss, ear pain, nasal congestion, sore throat Cardiovascular: No chest pain, palpitations Respiratory:  No shortness of breath at rest or with exertion, wheezes GastrointestinaI: No nausea, vomiting, diarrhea, abdominal pain, fecal incontinence Genitourinary:  No dysuria, urinary retention or frequency Musculoskeletal:  No neck pain, back pain Integumentary: No rash, pruritus, skin lesions Neurological: as above Psychiatric: No depression, insomnia, anxiety Endocrine: No palpitations, fatigue, diaphoresis, mood swings, change in appetite, change in weight, increased thirst Hematologic/Lymphatic:  No anemia, purpura, petechiae. Allergic/Immunologic: no itchy/runny eyes, nasal congestion, recent allergic reactions, rashes  PHYSICAL  EXAM: Filed Vitals:   06/09/13 1250  BP: 136/70  Pulse: 72   General: No acute distress Head:  Normocephalic/atraumatic Formal exam not performed this visit.  IMPRESSION: Benign paroxysmal positional vertigo, improved.  PLAN: 1.  Continue vestibular exercises. 2.  Will prescribe meclizine with one refill.  To be used sparingly for very severe attacks. 3.  No neurology follow up necessary unless warranted.  15 minutes spent with patient and wife, 100% spent counseling and coordinating care.  Shon Millet, DO  CC: Marga Melnick, MD

## 2013-06-11 ENCOUNTER — Telehealth: Payer: Self-pay | Admitting: Internal Medicine

## 2013-06-11 NOTE — Telephone Encounter (Signed)
Patient wife called about retaining a copy of her husband last lab work and also refilling her husband cholesterol medication.  Pharmacy [facility] - [place], [place] - [address]

## 2013-06-16 ENCOUNTER — Other Ambulatory Visit: Payer: Self-pay | Admitting: *Deleted

## 2013-06-16 MED ORDER — PRAVASTATIN SODIUM 20 MG PO TABS: 20.0000 mg | ORAL_TABLET | Freq: Every day | ORAL | Status: DC

## 2013-06-16 NOTE — Telephone Encounter (Signed)
Pravastatin refilled, #90, 1 refill. Labs mailed to patient

## 2013-06-16 NOTE — Telephone Encounter (Signed)
Pravastatin refilled per protocol 

## 2013-06-26 ENCOUNTER — Encounter: Payer: Self-pay | Admitting: Internal Medicine

## 2013-07-09 ENCOUNTER — Other Ambulatory Visit: Payer: Self-pay | Admitting: *Deleted

## 2013-07-09 MED ORDER — LOSARTAN POTASSIUM-HCTZ 100-12.5 MG PO TABS: ORAL_TABLET | ORAL | Status: DC

## 2013-07-21 DIAGNOSIS — M1710 Unilateral primary osteoarthritis, unspecified knee: Secondary | ICD-10-CM | POA: Diagnosis not present

## 2013-07-21 DIAGNOSIS — M19019 Primary osteoarthritis, unspecified shoulder: Secondary | ICD-10-CM | POA: Diagnosis not present

## 2013-07-28 ENCOUNTER — Telehealth: Payer: Self-pay | Admitting: *Deleted

## 2013-07-28 ENCOUNTER — Other Ambulatory Visit: Payer: Self-pay | Admitting: *Deleted

## 2013-07-28 MED ORDER — MONTELUKAST SODIUM 10 MG PO TABS
10.0000 mg | ORAL_TABLET | Freq: Every day | ORAL | Status: DC
Start: 2013-07-28 — End: 2013-12-08

## 2013-07-28 NOTE — Telephone Encounter (Signed)
Montelukast refilled per protocol. Pravastatin refilled 06/16/2013 #90, 1 refill. JG//[facility]MA

## 2013-07-28 NOTE — Telephone Encounter (Signed)
Patient wife called and requested a refill for montelukast (SINGULAIR) 10 MG tablet and pravastatin (PRAVACHOL) 20 MG tablet    Pharmacy [facility], [place]

## 2013-08-06 ENCOUNTER — Other Ambulatory Visit: Payer: Self-pay | Admitting: *Deleted

## 2013-08-06 MED ORDER — PANTOPRAZOLE SODIUM 40 MG PO TBEC: DELAYED_RELEASE_TABLET | ORAL | Status: DC

## 2013-08-15 ENCOUNTER — Encounter: Payer: Self-pay | Admitting: Gastroenterology

## 2013-09-26 DIAGNOSIS — L259 Unspecified contact dermatitis, unspecified cause: Secondary | ICD-10-CM | POA: Diagnosis not present

## 2013-09-26 DIAGNOSIS — L821 Other seborrheic keratosis: Secondary | ICD-10-CM | POA: Diagnosis not present

## 2013-10-10 DIAGNOSIS — N400 Enlarged prostate without lower urinary tract symptoms: Secondary | ICD-10-CM | POA: Diagnosis not present

## 2013-10-15 ENCOUNTER — Other Ambulatory Visit (HOSPITAL_COMMUNITY): Payer: Self-pay | Admitting: *Deleted

## 2013-10-15 DIAGNOSIS — I6529 Occlusion and stenosis of unspecified carotid artery: Secondary | ICD-10-CM

## 2013-10-20 ENCOUNTER — Encounter: Payer: Self-pay | Admitting: Internal Medicine

## 2013-10-20 ENCOUNTER — Ambulatory Visit (INDEPENDENT_AMBULATORY_CARE_PROVIDER_SITE_OTHER): Payer: Medicare Other | Admitting: Internal Medicine

## 2013-10-20 VITALS — BP 130/80 | HR 68 | Temp 97.5°F | Resp 20 | Ht 68.5 in | Wt 226.0 lb

## 2013-10-20 DIAGNOSIS — J309 Allergic rhinitis, unspecified: Secondary | ICD-10-CM

## 2013-10-20 DIAGNOSIS — R972 Elevated prostate specific antigen [PSA]: Secondary | ICD-10-CM

## 2013-10-20 DIAGNOSIS — I6529 Occlusion and stenosis of unspecified carotid artery: Secondary | ICD-10-CM | POA: Diagnosis not present

## 2013-10-20 DIAGNOSIS — M48061 Spinal stenosis, lumbar region without neurogenic claudication: Secondary | ICD-10-CM | POA: Diagnosis not present

## 2013-10-20 DIAGNOSIS — I10 Essential (primary) hypertension: Secondary | ICD-10-CM

## 2013-10-20 DIAGNOSIS — J302 Other seasonal allergic rhinitis: Secondary | ICD-10-CM

## 2013-10-20 DIAGNOSIS — Z23 Encounter for immunization: Secondary | ICD-10-CM | POA: Diagnosis not present

## 2013-10-20 DIAGNOSIS — E782 Mixed hyperlipidemia: Secondary | ICD-10-CM

## 2013-10-20 DIAGNOSIS — K219 Gastro-esophageal reflux disease without esophagitis: Secondary | ICD-10-CM

## 2013-10-20 DIAGNOSIS — M1990 Unspecified osteoarthritis, unspecified site: Secondary | ICD-10-CM

## 2013-10-20 DIAGNOSIS — K222 Esophageal obstruction: Secondary | ICD-10-CM

## 2013-10-20 NOTE — Patient Instructions (Signed)
Limit your sodium (Salt) intake    It is important that you exercise regularly, at least 20 minutes 3 to 4 times per week.  If you develop chest pain or shortness of breath seek  medical attention.  You need to lose weight.  Consider a lower calorie diet and regular exercise.  CPX 7 months

## 2013-10-20 NOTE — Progress Notes (Signed)
Subjective:    Patient ID: Joshua Lopez, male    DOB: 1930/05/21, 80 y.o.   MRN: [id number]  HPI   80 year old patient who is seen today as a new patient.  History of hypertension, and a history of dyslipidemia.  He has a history of PID in his head.  Left subclavian artery stenosis in the past.  He is scheduled for followup.  Ultrasound testing and your future.  Remains on statin therapy, which he continues to tolerate.  He has a history of osteoarthritis and is status post a number of orthopedic procedures, including a lumbar laminectomy for spinal stenosis.  He has a history of allergic rhinitis and nasal polyps.  He is on chronic PPI therapy due to reflux and prior esophageal stricture.  He has BPH and a history of elevated PSA and is followed by urology.  He is doing quite well today Complaints include some nasal stuffiness on the left.  He has seen Dr. Eugenie Norrie, the past for polypectomy. He also has a history of colonic polyps. Former tobacco user, but discontinued in 1974 or 1975  Past Medical History  Diagnosis Date  . Esophageal stricture   . Hypertension   . BPH (benign prostatic hypertrophy)     Dr. Isabel Caprice monitors  elevated PSA  . Colon polyp     2006 and 2010  . Hyperlipidemia     NMR  2007  . Subclavian steal syndrome 1997  . GERD (gastroesophageal reflux disease)   . Arthritis     diffuse  . Chronic lower back pain 05-10-12    States pain is related to hip    History   Social History  . Marital Status: Married    Spouse Name: N/A    Number of Children: N/A  . Years of Education: N/A   Occupational History  . Not on file.   Social History Main Topics  . Smoking status: Former Smoker -- 0.50 packs/day for 33 years    Types: Cigarettes    Quit date: 07/24/1973  . Smokeless tobacco: Former Neurosurgeon    Types: Chew    Quit date: 07/24/1974     Comment: smoked 1949- 1975 , up to 1/3 ppd  . Alcohol Use: No  . Drug Use: No  . Sexual Activity: Yes   Other Topics  Concern  . Not on file   Social History Narrative  . No narrative on file    Past Surgical History  Procedure Laterality Date  . Subclavian stent placement  1997    right side  . Prostate biopsy  2000    Dr. Earlene Plater  . Esophageal dilation  2003  . Colonoscopy w/ polypectomy  2006 & 2010  . Ganglion cyst excision  1999    LUE  . Total shoulder arthroplasty  2010    Dr. Dion Saucier; right  . Knee arthroscopy  2001    left  . Joint replacement    . Cataract extraction w/ intraocular lens  implant, bilateral    . Posterior fusion lumbar spine  02/08/12    L2-L4  . Back surgery  05-10-12    lumbar fusion with hardware  . Total hip arthroplasty  05/14/2012    Procedure: TOTAL HIP ARTHROPLASTY ANTERIOR APPROACH;  Surgeon: Kathryne Hitch, MD;  Location: [facility];  Service: Orthopedics;  Laterality: Right;  Right Total Hip Arthroplasty, Anterior Approach (C-Arm)  . Ganglion cyst excision  05/14/2012    Procedure: REMOVAL GANGLION OF WRIST;  Surgeon: Cristal Deer  Aretha Parrot, MD;  Location: [facility];  Service: Orthopedics;  Laterality: Right;  Excision Right Wrist Ganglion Cyst    Family History  Problem Relation Age of Onset  . Sudden death Father     murdered  . Hypertension Mother   . Skin cancer Mother   . Skin cancer Brother   . Hypertension Brother   . Coronary artery disease Brother     CABG  . Colon cancer Paternal Uncle   . Breast cancer Paternal Grandmother   . Breast cancer Paternal Aunt   . Colon cancer Paternal Aunt   . Heart attack Paternal Uncle   . Kidney disease Neg Hx   . Diabetes Neg Hx     Allergies  Allergen Reactions  . Aspirin Rash and Other (See Comments)    Dyspnea, eyelid edema, truncal urticaria--Patient tolerates other NSAIDS at home Because of a history of documented adverse serious drug reaction;Medi Alert bracelet  is recommended    Current Outpatient Prescriptions on File Prior to Visit  Medication Sig Dispense Refill  . acetaminophen  (TYLENOL) 500 MG tablet Take 500 mg by mouth every 6 (six) hours as needed. For pain      . amLODipine (NORVASC) 5 MG tablet Take 1 tablet (5 mg total) by mouth every morning.  90 tablet  3  . amoxicillin (AMOXIL) 500 MG capsule Take 2,000 mg by mouth once as needed. One hour before dental appointment      . Calcium Carbonate-Vitamin D (CALTRATE 600+D PO) Take 1 tablet by mouth 2 (two) times daily.       . Cyanocobalamin 2500 MCG TABS Take 1,250 mcg by mouth every morning.      . docusate sodium (COLACE) 100 MG capsule Take 100 mg by mouth at bedtime as needed.       . fish oil-omega-3 fatty acids 1000 MG capsule Take 1 g by mouth 2 (two) times daily.       Marland Kitchen losartan-hydrochlorothiazide (HYZAAR) 100-12.5 MG per tablet TAKE ONE TABLET BY MOUTH EVERY DAY  90 tablet  1  . meloxicam (MOBIC) 7.5 MG tablet       . montelukast (SINGULAIR) 10 MG tablet Take 1 tablet (10 mg total) by mouth at bedtime. ID Number: ([id number])  90 tablet  1  . pantoprazole (PROTONIX) 40 MG tablet TAKE ONE TABLET BY MOUTH EVERY DAY 30 MINUTES BEFORE A MEAL  90 tablet  1  . polyethylene glycol (MIRALAX / GLYCOLAX) packet Take 17 g by mouth 2 (two) times daily.       . pravastatin (PRAVACHOL) 20 MG tablet Take 1 tablet (20 mg total) by mouth at bedtime.  90 tablet  1  . SAW PALMETTO, SERENOA REPENS, PO Take 1 tablet by mouth every morning.       . Tamsulosin HCl (FLOMAX) 0.4 MG CAPS Take 0.4 mg by mouth at bedtime.       . fluticasone (FLONASE) 50 MCG/ACT nasal spray 1 spray in each nostril twice a day as needed. Use the "crossover" technique as discussed  16 g  2   No current facility-administered medications on file prior to visit.    BP 130/80  Pulse 68  Temp(Src) 97.5 F (36.4 C) (Oral)  Resp 20  Ht 5' 8.5" (1.74 m)  Wt 226 lb (102.513 kg)  BMI 33.86 kg/m2  SpO2 99%       Review of Systems  Constitutional: Negative for fever, chills, activity change, appetite change and fatigue.  HENT: Positive  for  postnasal drip and rhinorrhea. Negative for congestion, dental problem, ear pain, hearing loss, mouth sores, sinus pressure, sneezing, tinnitus, trouble swallowing and voice change.   Eyes: Negative for photophobia, pain, redness and visual disturbance.  Respiratory: Negative for apnea, cough, choking, chest tightness, shortness of breath and wheezing.   Cardiovascular: Negative for chest pain, palpitations and leg swelling.  Gastrointestinal: Negative for nausea, vomiting, abdominal pain, diarrhea, constipation, blood in stool, abdominal distention, anal bleeding and rectal pain.  Genitourinary: Negative for dysuria, urgency, frequency, hematuria, flank pain, decreased urine volume, discharge, penile swelling, scrotal swelling, difficulty urinating, genital sores and testicular pain.  Musculoskeletal: Negative for arthralgias, back pain, gait problem, joint swelling, myalgias, neck pain and neck stiffness.  Skin: Negative for color change, rash and wound.  Neurological: Negative for dizziness, tremors, seizures, syncope, facial asymmetry, speech difficulty, weakness, light-headedness, numbness and headaches.  Hematological: Negative for adenopathy. Does not bruise/bleed easily.  Psychiatric/Behavioral: Negative for suicidal ideas, hallucinations, behavioral problems, confusion, sleep disturbance, self-injury, dysphoric mood, decreased concentration and agitation. The patient is not nervous/anxious.        Objective:   Physical Exam  Constitutional: He appears well-developed and well-nourished.  HENT:  Head: Normocephalic and atraumatic.  Right Ear: External ear normal.  Left Ear: External ear normal.  Nose: Nose normal.  Mouth/Throat: Oropharynx is clear and moist.  Eyes: Conjunctivae and EOM are normal. Pupils are equal, round, and reactive to light. No scleral icterus.  Neck: Normal range of motion. Neck supple. No JVD present. No thyromegaly present.  Cardiovascular: Regular rhythm and  normal heart sounds.  Exam reveals no gallop and no friction rub.   No murmur heard. Left dorsalis pedis pulse intact  Right supraclavicular bruit  Symmetrical and normal blood pressure readings  Pulmonary/Chest: Effort normal and breath sounds normal. He exhibits no tenderness.  Abdominal: Soft. Bowel sounds are normal. He exhibits no distension and no mass. There is no tenderness.  Genitourinary: Prostate normal and penis normal.  Musculoskeletal: Normal range of motion. He exhibits no edema and no tenderness.  Lymphadenopathy:    He has no cervical adenopathy.  Neurological: He is alert. He has normal reflexes. No cranial nerve deficit. Coordination normal.  Skin: Skin is warm and dry. No rash noted.  Psychiatric: He has a normal mood and affect. His behavior is normal.          Assessment & Plan:   Hypertension well controlled History of subclavian artery stenosis Dyslipidemia History of esophageal stricture.  Continue PPI therapy BPH and history of elevated PSA.  Followup urology DJD  CPX in 7 months with laboratory update at that time  medical regimen.  Refilled

## 2013-10-20 NOTE — Progress Notes (Signed)
Pre-visit discussion using our clinic review tool. No additional management support is needed unless otherwise documented below in the visit note.  

## 2013-10-21 ENCOUNTER — Telehealth: Payer: Self-pay | Admitting: Internal Medicine

## 2013-10-21 NOTE — Telephone Encounter (Signed)
Relevant patient education assigned to patient using Emmi. ° °

## 2013-10-22 ENCOUNTER — Ambulatory Visit (HOSPITAL_COMMUNITY): Payer: Medicare Other | Attending: Internal Medicine | Admitting: *Deleted

## 2013-10-22 ENCOUNTER — Encounter (HOSPITAL_COMMUNITY): Payer: Medicare Other

## 2013-10-22 DIAGNOSIS — I6529 Occlusion and stenosis of unspecified carotid artery: Secondary | ICD-10-CM | POA: Diagnosis not present

## 2013-10-22 NOTE — Progress Notes (Signed)
Carotid Duplex complete 

## 2013-10-24 DIAGNOSIS — H6120 Impacted cerumen, unspecified ear: Secondary | ICD-10-CM | POA: Diagnosis not present

## 2013-10-27 ENCOUNTER — Telehealth: Payer: Self-pay | Admitting: Internal Medicine

## 2013-10-27 MED ORDER — PRAVASTATIN SODIUM 20 MG PO TABS: 20.0000 mg | ORAL_TABLET | Freq: Every day | ORAL | Status: DC

## 2013-10-27 NOTE — Telephone Encounter (Signed)
Pt's wife notified Rx sent to [facility].

## 2013-10-27 NOTE — Telephone Encounter (Signed)
Wife is calling in regards to pt's rx pravastatin (PRAVACHOL) 20 MG tablet Pt is needing new rx sent to [facility]. 90 day supply, generic

## 2013-11-07 DIAGNOSIS — J309 Allergic rhinitis, unspecified: Secondary | ICD-10-CM | POA: Diagnosis not present

## 2013-11-10 DIAGNOSIS — J019 Acute sinusitis, unspecified: Secondary | ICD-10-CM | POA: Diagnosis not present

## 2013-11-10 DIAGNOSIS — J329 Chronic sinusitis, unspecified: Secondary | ICD-10-CM | POA: Diagnosis not present

## 2013-11-11 DIAGNOSIS — J309 Allergic rhinitis, unspecified: Secondary | ICD-10-CM | POA: Diagnosis not present

## 2013-11-25 DIAGNOSIS — J339 Nasal polyp, unspecified: Secondary | ICD-10-CM | POA: Diagnosis not present

## 2013-11-26 ENCOUNTER — Other Ambulatory Visit: Payer: Self-pay | Admitting: Otolaryngology

## 2013-11-26 DIAGNOSIS — J343 Hypertrophy of nasal turbinates: Secondary | ICD-10-CM | POA: Diagnosis not present

## 2013-11-26 DIAGNOSIS — J338 Other polyp of sinus: Secondary | ICD-10-CM | POA: Diagnosis not present

## 2013-11-26 DIAGNOSIS — J320 Chronic maxillary sinusitis: Secondary | ICD-10-CM | POA: Diagnosis not present

## 2013-11-26 DIAGNOSIS — J322 Chronic ethmoidal sinusitis: Secondary | ICD-10-CM | POA: Diagnosis not present

## 2013-11-26 DIAGNOSIS — J339 Nasal polyp, unspecified: Secondary | ICD-10-CM | POA: Diagnosis not present

## 2013-11-26 DIAGNOSIS — J329 Chronic sinusitis, unspecified: Secondary | ICD-10-CM | POA: Diagnosis not present

## 2013-11-26 DIAGNOSIS — J0100 Acute maxillary sinusitis, unspecified: Secondary | ICD-10-CM | POA: Diagnosis not present

## 2013-12-08 ENCOUNTER — Telehealth: Payer: Self-pay | Admitting: Internal Medicine

## 2013-12-08 MED ORDER — MONTELUKAST SODIUM 10 MG PO TABS: 10.0000 mg | ORAL_TABLET | Freq: Every day | ORAL | Status: DC

## 2013-12-08 NOTE — Telephone Encounter (Signed)
Spoke to pt's wife Massie Maroon told her to let pt know I sent Rx refill to [facility] as requested. Massie Maroon verbalized understanding.

## 2013-12-08 NOTE — Telephone Encounter (Signed)
Pt needs a new rx for montelukast (SINGULAIR) 10 MG tablet    For 90 day supply  Pharmacy ; [facility]   Fax no.  [phone]-  [phone]

## 2014-02-09 ENCOUNTER — Telehealth: Payer: Self-pay | Admitting: Internal Medicine

## 2014-02-09 MED ORDER — PANTOPRAZOLE SODIUM 40 MG PO TBEC: DELAYED_RELEASE_TABLET | ORAL | Status: DC

## 2014-02-09 MED ORDER — LOSARTAN POTASSIUM-HCTZ 100-12.5 MG PO TABS: ORAL_TABLET | ORAL | Status: DC

## 2014-02-09 NOTE — Telephone Encounter (Signed)
Spoke to Steele, told her Rx's were sent to [facility]. Massie Maroon verbalized understanding.

## 2014-02-09 NOTE — Telephone Encounter (Signed)
Pt's wife called, needs 2 medications refilled to [facility] mail order pharmacy, losartan-hydrochlorothiazide (HYZAAR) 100-12.5 and pantoprazole (PROTONIX) 40 MG tablet.  Pt would like 90 days on each medication.  Best number to call [phone].

## 2014-03-09 ENCOUNTER — Telehealth: Payer: Self-pay | Admitting: Internal Medicine

## 2014-03-09 DIAGNOSIS — I10 Essential (primary) hypertension: Secondary | ICD-10-CM

## 2014-03-09 MED ORDER — AMLODIPINE BESYLATE 5 MG PO TABS: 5.0000 mg | ORAL_TABLET | Freq: Every morning | ORAL | Status: DC

## 2014-03-09 NOTE — Telephone Encounter (Signed)
Rx sent 

## 2014-03-09 NOTE — Telephone Encounter (Signed)
[facility] [address] [facility] [address] is requesting 90 re-fills on amLODipine (NORVASC) 5 MG tablet

## 2014-05-05 DIAGNOSIS — J322 Chronic ethmoidal sinusitis: Secondary | ICD-10-CM | POA: Diagnosis not present

## 2014-05-05 DIAGNOSIS — J301 Allergic rhinitis due to pollen: Secondary | ICD-10-CM | POA: Diagnosis not present

## 2014-05-05 DIAGNOSIS — J320 Chronic maxillary sinusitis: Secondary | ICD-10-CM | POA: Diagnosis not present

## 2014-05-05 DIAGNOSIS — J330 Polyp of nasal cavity: Secondary | ICD-10-CM | POA: Diagnosis not present

## 2014-06-01 ENCOUNTER — Encounter: Payer: Self-pay | Admitting: Gastroenterology

## 2014-06-15 DIAGNOSIS — D3132 Benign neoplasm of left choroid: Secondary | ICD-10-CM | POA: Diagnosis not present

## 2014-06-15 DIAGNOSIS — Z961 Presence of intraocular lens: Secondary | ICD-10-CM | POA: Diagnosis not present

## 2014-06-23 DIAGNOSIS — H47092 Other disorders of optic nerve, not elsewhere classified, left eye: Secondary | ICD-10-CM | POA: Diagnosis not present

## 2014-06-23 DIAGNOSIS — H43811 Vitreous degeneration, right eye: Secondary | ICD-10-CM | POA: Diagnosis not present

## 2014-07-23 ENCOUNTER — Ambulatory Visit (INDEPENDENT_AMBULATORY_CARE_PROVIDER_SITE_OTHER): Payer: Medicare Other | Admitting: Internal Medicine

## 2014-07-23 ENCOUNTER — Encounter: Payer: Self-pay | Admitting: Internal Medicine

## 2014-07-23 VITALS — BP 120/74 | HR 70 | Temp 98.6°F | Ht 69.0 in | Wt 212.0 lb

## 2014-07-23 DIAGNOSIS — Z23 Encounter for immunization: Secondary | ICD-10-CM

## 2014-07-23 DIAGNOSIS — Z860100 Personal history of colon polyps, unspecified: Secondary | ICD-10-CM

## 2014-07-23 DIAGNOSIS — Z0000 Encounter for general adult medical examination without abnormal findings: Secondary | ICD-10-CM | POA: Diagnosis not present

## 2014-07-23 DIAGNOSIS — K219 Gastro-esophageal reflux disease without esophagitis: Secondary | ICD-10-CM | POA: Diagnosis not present

## 2014-07-23 DIAGNOSIS — I10 Essential (primary) hypertension: Secondary | ICD-10-CM

## 2014-07-23 DIAGNOSIS — E782 Mixed hyperlipidemia: Secondary | ICD-10-CM | POA: Diagnosis not present

## 2014-07-23 DIAGNOSIS — M4806 Spinal stenosis, lumbar region: Secondary | ICD-10-CM

## 2014-07-23 DIAGNOSIS — I6529 Occlusion and stenosis of unspecified carotid artery: Secondary | ICD-10-CM | POA: Diagnosis not present

## 2014-07-23 DIAGNOSIS — R972 Elevated prostate specific antigen [PSA]: Secondary | ICD-10-CM

## 2014-07-23 DIAGNOSIS — M129 Arthropathy, unspecified: Secondary | ICD-10-CM | POA: Diagnosis not present

## 2014-07-23 DIAGNOSIS — M48061 Spinal stenosis, lumbar region without neurogenic claudication: Secondary | ICD-10-CM

## 2014-07-23 LAB — CBC WITH DIFFERENTIAL/PLATELET
Basophils Absolute: 0 10*3/uL (ref 0.0–0.1)
Basophils Relative: 0.6 % (ref 0.0–3.0)
Eosinophils Absolute: 0.1 10*3/uL (ref 0.0–0.7)
Eosinophils Relative: 1.7 % (ref 0.0–5.0)
HCT: 41 % (ref 39.0–52.0)
Hemoglobin: 13.7 g/dL (ref 13.0–17.0)
Lymphocytes Relative: 25.3 % (ref 12.0–46.0)
Lymphs Abs: 1.5 10*3/uL (ref 0.7–4.0)
MCHC: 33.5 g/dL (ref 30.0–36.0)
MCV: 92.7 fl (ref 78.0–100.0)
Monocytes Absolute: 0.4 10*3/uL (ref 0.1–1.0)
Monocytes Relative: 6 % (ref 3.0–12.0)
Neutro Abs: 3.9 10*3/uL (ref 1.4–7.7)
Neutrophils Relative %: 66.4 % (ref 43.0–77.0)
Platelets: 179 10*3/uL (ref 150.0–400.0)
RBC: 4.42 Mil/uL (ref 4.22–5.81)
RDW: 13.4 % (ref 11.5–15.5)
WBC: 5.8 10*3/uL (ref 4.0–10.5)

## 2014-07-23 LAB — LIPID PANEL
Cholesterol: 140 mg/dL (ref 0–200)
HDL: 50.6 mg/dL (ref >39.00)
LDL Cholesterol: 68 mg/dL (ref 0–99)
NonHDL: 89.4
Total CHOL/HDL Ratio: 3
Triglycerides: 109 mg/dL (ref 0.0–149.0)
VLDL: 21.8 mg/dL (ref 0.0–40.0)

## 2014-07-23 LAB — COMPREHENSIVE METABOLIC PANEL
ALT: 20 U/L (ref 0–53)
AST: 20 U/L (ref 0–37)
Albumin: 4.4 g/dL (ref 3.5–5.2)
Alkaline Phosphatase: 68 U/L (ref 39–117)
BUN: 28 mg/dL — ABNORMAL HIGH (ref 6–23)
CO2: 24 mEq/L (ref 19–32)
Calcium: 8.9 mg/dL (ref 8.4–10.5)
Chloride: 105 mEq/L (ref 96–112)
Creatinine, Ser: 1.2 mg/dL (ref 0.4–1.5)
GFR: 60.98 mL/min (ref >60.00)
Glucose, Bld: 107 mg/dL — ABNORMAL HIGH (ref 70–99)
Potassium: 4 mEq/L (ref 3.5–5.1)
Sodium: 135 mEq/L (ref 135–145)
Total Bilirubin: 0.9 mg/dL (ref 0.2–1.2)
Total Protein: 6.9 g/dL (ref 6.0–8.3)

## 2014-07-23 LAB — TSH: TSH: 1.81 u[IU]/mL (ref 0.35–4.50)

## 2014-07-23 MED ORDER — TAMSULOSIN HCL 0.4 MG PO CAPS: 0.4000 mg | ORAL_CAPSULE | Freq: Every day | ORAL | Status: DC

## 2014-07-23 NOTE — Progress Notes (Signed)
Subjective:    Patient ID: Joshua Lopez, male    DOB: [birth year], 80 y.o.   MRN: [id number]  HPI 80 year old patient who is seen today for a preventive health examination.  History of hypertension, and a history of dyslipidemia. Left subclavian artery stenosis in the past.  He is scheduled for followup.  Ultrasound testing In the future.  Remains on statin therapy, which he continues to tolerate.  He has a history of osteoarthritis and is status post a number of orthopedic procedures, including a lumbar laminectomy for spinal stenosis.  He has a history of allergic rhinitis and nasal polyps.  He is on chronic PPI therapy due to reflux and prior esophageal stricture.  He has BPH and a history of elevated PSA and is followed by urology.  He is doing quite well today Complaints include some nasal stuffiness on the left.  He has seen Dr. Haroldine Laws in  the past for polypectomy. He also has a history of colonic polyps. Former tobacco user, but discontinued in 1974 or 1975  Past Medical History  Diagnosis Date  . Esophageal stricture   . Hypertension   . BPH (benign prostatic hypertrophy)     Dr. Isabel Caprice monitors  elevated PSA  . Colon polyp     2006 and 2010  . Hyperlipidemia     NMR  2007  . Subclavian steal syndrome 1997  . GERD (gastroesophageal reflux disease)   . Arthritis     diffuse  . Chronic lower back pain 05-10-12    States pain is related to hip    History   Social History  . Marital Status: Married    Spouse Name: N/A    Number of Children: N/A  . Years of Education: N/A   Occupational History  . Not on file.   Social History Main Topics  . Smoking status: Former Smoker -- 0.50 packs/day for 33 years    Types: Cigarettes    Quit date: 07/24/1973  . Smokeless tobacco: Former Neurosurgeon    Types: Chew    Quit date: 07/24/1974     Comment: smoked 1949- 1975 , up to 1/3 ppd  . Alcohol Use: No  . Drug Use: No  . Sexual Activity: Yes   Other Topics Concern  . Not on  file   Social History Narrative    Past Surgical History  Procedure Laterality Date  . Subclavian stent placement  1997    right side  . Prostate biopsy  2000    Dr. Earlene Plater  . Esophageal dilation  2003  . Colonoscopy w/ polypectomy  2006 & 2010  . Ganglion cyst excision  1999    LUE  . Total shoulder arthroplasty  2010    Dr. Dion Saucier; right  . Knee arthroscopy  2001    left  . Joint replacement    . Cataract extraction w/ intraocular lens  implant, bilateral    . Posterior fusion lumbar spine  02/08/12    L2-L4  . Back surgery  05-10-12    lumbar fusion with hardware  . Total hip arthroplasty  05/14/2012    Procedure: TOTAL HIP ARTHROPLASTY ANTERIOR APPROACH;  Surgeon: Kathryne Hitch, MD;  Location: [facility];  Service: Orthopedics;  Laterality: Right;  Right Total Hip Arthroplasty, Anterior Approach (C-Arm)  . Ganglion cyst excision  05/14/2012    Procedure: REMOVAL GANGLION OF WRIST;  Surgeon: Kathryne Hitch, MD;  Location: [facility];  Service: Orthopedics;  Laterality: Right;  Excision Right  Wrist Ganglion Cyst    Family History  Problem Relation Age of Onset  . Sudden death Father     murdered  . Hypertension Mother   . Skin cancer Mother   . Skin cancer Brother   . Hypertension Brother   . Coronary artery disease Brother     CABG  . Colon cancer Paternal Uncle   . Breast cancer Paternal Grandmother   . Breast cancer Paternal Aunt   . Colon cancer Paternal Aunt   . Heart attack Paternal Uncle   . Kidney disease Neg Hx   . Diabetes Neg Hx     Allergies  Allergen Reactions  . Aspirin Rash and Other (See Comments)    Dyspnea, eyelid edema, truncal urticaria--Patient tolerates other NSAIDS at home Because of a history of documented adverse serious drug reaction;Medi Alert bracelet  is recommended    Current Outpatient Prescriptions on File Prior to Visit  Medication Sig Dispense Refill  . amLODipine (NORVASC) 5 MG tablet Take 1 tablet (5 mg total) by  mouth every morning. 90 tablet 1  . Calcium Carbonate-Vitamin D (CALTRATE 600+D PO) Take 1 tablet by mouth 2 (two) times daily.     . fluticasone (FLONASE) 50 MCG/ACT nasal spray 1 spray in each nostril twice a day as needed. Use the "crossover" technique as discussed 16 g 2  . losartan-hydrochlorothiazide (HYZAAR) 100-12.5 MG per tablet TAKE ONE TABLET BY MOUTH EVERY DAY 90 tablet 1  . meloxicam (MOBIC) 7.5 MG tablet     . montelukast (SINGULAIR) 10 MG tablet Take 1 tablet (10 mg total) by mouth at bedtime. ID Number: ([id number]) 90 tablet 3  . pantoprazole (PROTONIX) 40 MG tablet TAKE ONE TABLET BY MOUTH EVERY DAY 30 MINUTES BEFORE A MEAL 90 tablet 1  . polyethylene glycol (MIRALAX / GLYCOLAX) packet Take 17 g by mouth 2 (two) times daily.     . pravastatin (PRAVACHOL) 20 MG tablet Take 1 tablet (20 mg total) by mouth at bedtime. 90 tablet 3  . SAW PALMETTO, SERENOA REPENS, PO Take 1 tablet by mouth every morning.     Marland Kitchen acetaminophen (TYLENOL) 500 MG tablet Take 500 mg by mouth every 6 (six) hours as needed. For pain    . docusate sodium (COLACE) 100 MG capsule Take 100 mg by mouth at bedtime as needed.      No current facility-administered medications on file prior to visit.    BP 120/74 mmHg  Pulse 70  Temp(Src) 98.6 F (37 C) (Oral)  Ht 5\' 9"  (1.753 m)  Wt 212 lb (96.163 kg)  BMI 31.29 kg/m2  SpO2 98%    1. Risk factors, based on past  M,S,F history.   cardiovascular risk factors include hypertension, dyslipidemia and peripheral vascular disease  2.  Physical activities: limited due to arthritis.  Status post hip replacement surgery no major limitations  3.  Depression/mood:no history depression and mood disorder  4.  Hearing:mild deficit only  5.  ADL's:independent  6.  Fall risk:low  7.  Home safety:no problems identified  8.  Height weight, and visual acuity;height and weight stable no change in visual acuity.  Followed closely by ophthalmology due to a small left  retinal hemorrhage  9.  Counseling:heart healthy diet recommended.  Modest weight loss encouraged  10. Lab orders based on risk factors:laboratory profile, including lipid panel will be reviewed  11. Referral :follow-up urology and ophthalmology  12. Care plan:continue heart healthy diet and efforts at weight loss  13.  Cognitive assessment: alert and oriented with normal affect.  No cognitive dysfunction  14. Screening:we'll continue annual health examinations.  Will screen  With carotid and subclavian artery Dopplers annually.  Follow-up urology annually  15. Provider List Update: ophthalmology, urology primary care and radiology     Review of Systems  Constitutional: Negative for fever, chills, activity change, appetite change and fatigue.  HENT: Positive for postnasal drip and rhinorrhea. Negative for congestion, dental problem, ear pain, hearing loss, mouth sores, sinus pressure, sneezing, tinnitus, trouble swallowing and voice change.   Eyes: Negative for photophobia, pain, redness and visual disturbance.  Respiratory: Negative for apnea, cough, choking, chest tightness, shortness of breath and wheezing.   Cardiovascular: Negative for chest pain, palpitations and leg swelling.  Gastrointestinal: Negative for nausea, vomiting, abdominal pain, diarrhea, constipation, blood in stool, abdominal distention, anal bleeding and rectal pain.  Genitourinary: Negative for dysuria, urgency, frequency, hematuria, flank pain, decreased urine volume, discharge, penile swelling, scrotal swelling, difficulty urinating, genital sores and testicular pain.  Musculoskeletal: Negative for myalgias, back pain, joint swelling, arthralgias, gait problem, neck pain and neck stiffness.  Skin: Negative for color change, rash and wound.  Neurological: Negative for dizziness, tremors, seizures, syncope, facial asymmetry, speech difficulty, weakness, light-headedness, numbness and headaches.  Hematological:  Negative for adenopathy. Does not bruise/bleed easily.  Psychiatric/Behavioral: Negative for suicidal ideas, hallucinations, behavioral problems, confusion, sleep disturbance, self-injury, dysphoric mood, decreased concentration and agitation. The patient is not nervous/anxious.        Objective:   Physical Exam  Constitutional: He appears well-developed and well-nourished.  HENT:  Head: Normocephalic and atraumatic.  Right Ear: External ear normal.  Left Ear: External ear normal.  Nose: Nose normal.  Mouth/Throat: Oropharynx is clear and moist.  Eyes: Conjunctivae and EOM are normal. Pupils are equal, round, and reactive to light. No scleral icterus.  Neck: Normal range of motion. Neck supple. No JVD present. No thyromegaly present.  Cardiovascular: Regular rhythm, normal heart sounds and intact distal pulses.  Exam reveals no gallop and no friction rub.   No murmur heard. Pulmonary/Chest: Effort normal and breath sounds normal. He exhibits no tenderness.  Abdominal: Soft. Bowel sounds are normal. He exhibits no distension and no mass. There is no tenderness.  Genitourinary: Penis normal.  Musculoskeletal: Normal range of motion. He exhibits no edema or tenderness.  Lymphadenopathy:    He has no cervical adenopathy.  Neurological: He is alert. He has normal reflexes. No cranial nerve deficit. Coordination normal.  Skin: Skin is warm and dry. No rash noted.  Psychiatric: He has a normal mood and affect. His behavior is normal.          Assessment & Plan:  Preventive health examination Hypertension well controlled History of subclavian artery stenosis.  Continue surveillance Dyslipidemia History of esophageal stricture.  Continue PPI therapy BPH and history of elevated PSA.  Followup urology.  Scheduled March of each year DJD  We'll check laboratory update Recheck one year or as needed

## 2014-07-23 NOTE — Patient Instructions (Signed)
Limit your sodium (Salt) intake    It is important that you exercise regularly, at least 20 minutes 3 to 4 times per week.  If you develop chest pain or shortness of breath seek  medical attention.  Return in one year for follow-up Health Maintenance A healthy lifestyle and preventative care can promote health and wellness.  Maintain regular health, dental, and eye exams.  Eat a healthy diet. Foods like vegetables, fruits, whole grains, low-fat dairy products, and lean protein foods contain the nutrients you need and are low in calories. Decrease your intake of foods high in solid fats, added sugars, and salt. Get information about a proper diet from your health care provider, if necessary.  Regular physical exercise is one of the most important things you can do for your health. Most adults should get at least 150 minutes of moderate-intensity exercise (any activity that increases your heart rate and causes you to sweat) each week. In addition, most adults need muscle-strengthening exercises on 2 or more days a week.   Maintain a healthy weight. The body mass index (BMI) is a screening tool to identify possible weight problems. It provides an estimate of body fat based on height and weight. Your health care provider can find your BMI and can help you achieve or maintain a healthy weight. For males 20 years and older:  A BMI below 18.5 is considered underweight.  A BMI of 18.5 to 24.9 is normal.  A BMI of 25 to 29.9 is considered overweight.  A BMI of 30 and above is considered obese.  Maintain normal blood lipids and cholesterol by exercising and minimizing your intake of saturated fat. Eat a balanced diet with plenty of fruits and vegetables. Blood tests for lipids and cholesterol should begin at age 48 and be repeated every 5 years. If your lipid or cholesterol levels are high, you are over age 72, or you are at high risk for heart disease, you may need your cholesterol levels checked  more frequently.Ongoing high lipid and cholesterol levels should be treated with medicines if diet and exercise are not working.  If you smoke, find out from your health care provider how to quit. If you do not use tobacco, do not start.  Lung cancer screening is recommended for adults aged 86-80 years who are at high risk for developing lung cancer because of a history of smoking. A yearly low-dose CT scan of the lungs is recommended for people who have at least a 30-pack-year history of smoking and are current smokers or have quit within the past 15 years. A pack year of smoking is smoking an average of 1 pack of cigarettes a day for 1 year (for example, a 30-pack-year history of smoking could mean smoking 1 pack a day for 30 years or 2 packs a day for 15 years). Yearly screening should continue until the smoker has stopped smoking for at least 15 years. Yearly screening should be stopped for people who develop a health problem that would prevent them from having lung cancer treatment.  If you choose to drink alcohol, do not have more than 2 drinks per day. One drink is considered to be 12 oz (360 mL) of beer, 5 oz (150 mL) of wine, or 1.5 oz (45 mL) of liquor.  Avoid the use of street drugs. Do not share needles with anyone. Ask for help if you need support or instructions about stopping the use of drugs.  High blood pressure causes heart disease  and increases the risk of stroke. Blood pressure should be checked at least every 1-2 years. Ongoing high blood pressure should be treated with medicines if weight loss and exercise are not effective.  If you are 45-79 years old, ask your health care provider if you should take aspirin to prevent heart disease.  Diabetes screening involves taking a blood sample to check your fasting blood sugar level. This should be done once every 3 years after age 45 if you are at a normal weight and without risk factors for diabetes. Testing should be considered at a  younger age or be carried out more frequently if you are overweight and have at least 1 risk factor for diabetes.  Colorectal cancer can be detected and often prevented. Most routine colorectal cancer screening begins at the age of 50 and continues through age 75. However, your health care provider may recommend screening at an earlier age if you have risk factors for colon cancer. On a yearly basis, your health care provider may provide home test kits to check for hidden blood in the stool. A small camera at the end of a tube may be used to directly examine the colon (sigmoidoscopy or colonoscopy) to detect the earliest forms of colorectal cancer. Talk to your health care provider about this at age 50 when routine screening begins. A direct exam of the colon should be repeated every 5-10 years through age 75, unless early forms of precancerous polyps or small growths are found.  People who are at an increased risk for hepatitis B should be screened for this virus. You are considered at high risk for hepatitis B if:  You were born in a country where hepatitis B occurs often. Talk with your health care provider about which countries are considered high risk.  Your parents were born in a high-risk country and you have not received a shot to protect against hepatitis B (hepatitis B vaccine).  You have HIV or AIDS.  You use needles to inject street drugs.  You live with, or have sex with, someone who has hepatitis B.  You are a man who has sex with other men (MSM).  You get hemodialysis treatment.  You take certain medicines for conditions like cancer, organ transplantation, and autoimmune conditions.  Hepatitis C blood testing is recommended for all people born from 1945 through 1965 and any individual with known risk factors for hepatitis C.  Healthy men should no longer receive prostate-specific antigen (PSA) blood tests as part of routine cancer screening. Talk to your health care provider  about prostate cancer screening.  Testicular cancer screening is not recommended for adolescents or adult males who have no symptoms. Screening includes self-exam, a health care provider exam, and other screening tests. Consult with your health care provider about any symptoms you have or any concerns you have about testicular cancer.  Practice safe sex. Use condoms and avoid high-risk sexual practices to reduce the spread of sexually transmitted infections (STIs).  You should be screened for STIs, including gonorrhea and chlamydia if:  You are sexually active and are younger than 24 years.  You are older than 24 years, and your health care provider tells you that you are at risk for this type of infection.  Your sexual activity has changed since you were last screened, and you are at an increased risk for chlamydia or gonorrhea. Ask your health care provider if you are at risk.  If you are at risk of being infected with   HIV, it is recommended that you take a prescription medicine daily to prevent HIV infection. This is called pre-exposure prophylaxis (PrEP). You are considered at risk if:  You are a man who has sex with other men (MSM).  You are a heterosexual man who is sexually active with multiple partners.  You take drugs by injection.  You are sexually active with a partner who has HIV.  Talk with your health care provider about whether you are at high risk of being infected with HIV. If you choose to begin PrEP, you should first be tested for HIV. You should then be tested every 3 months for as long as you are taking PrEP.  Use sunscreen. Apply sunscreen liberally and repeatedly throughout the day. You should seek shade when your shadow is shorter than you. Protect yourself by wearing long sleeves, pants, a wide-brimmed hat, and sunglasses year round whenever you are outdoors.  Tell your health care provider of new moles or changes in moles, especially if there is a change in shape  or color. Also, tell your health care provider if a mole is larger than the size of a pencil eraser.  A one-time screening for abdominal aortic aneurysm (AAA) and surgical repair of large AAAs by ultrasound is recommended for men aged 65-75 years who are current or former smokers.  Stay current with your vaccines (immunizations). Document Released: 01/06/2008 Document Revised: 07/15/2013 Document Reviewed: 12/05/2010 [employer] Patient Information 2015 [employer]. This information is not intended to replace advice given to you by your health care provider. Make sure you discuss any questions you have with your health care provider.  

## 2014-07-27 ENCOUNTER — Telehealth: Payer: Self-pay | Admitting: Internal Medicine

## 2014-07-27 DIAGNOSIS — J322 Chronic ethmoidal sinusitis: Secondary | ICD-10-CM | POA: Diagnosis not present

## 2014-07-27 DIAGNOSIS — J320 Chronic maxillary sinusitis: Secondary | ICD-10-CM | POA: Diagnosis not present

## 2014-07-27 MED ORDER — PANTOPRAZOLE SODIUM 40 MG PO TBEC: DELAYED_RELEASE_TABLET | ORAL | Status: DC

## 2014-07-27 NOTE — Telephone Encounter (Signed)
Pt notified Rx sent to pharmacy

## 2014-07-27 NOTE — Telephone Encounter (Signed)
Pt request refill pantoprazole (PROTONIX) 40 MG tablet Pt seen 12/31 but refill did not get sent in.  [facility]

## 2014-07-28 DIAGNOSIS — H47092 Other disorders of optic nerve, not elsewhere classified, left eye: Secondary | ICD-10-CM | POA: Diagnosis not present

## 2014-07-28 DIAGNOSIS — H43811 Vitreous degeneration, right eye: Secondary | ICD-10-CM | POA: Diagnosis not present

## 2014-08-06 DIAGNOSIS — D1801 Hemangioma of skin and subcutaneous tissue: Secondary | ICD-10-CM | POA: Diagnosis not present

## 2014-08-06 DIAGNOSIS — L820 Inflamed seborrheic keratosis: Secondary | ICD-10-CM | POA: Diagnosis not present

## 2014-08-06 DIAGNOSIS — L308 Other specified dermatitis: Secondary | ICD-10-CM | POA: Diagnosis not present

## 2014-08-06 DIAGNOSIS — Z85828 Personal history of other malignant neoplasm of skin: Secondary | ICD-10-CM | POA: Diagnosis not present

## 2014-08-06 DIAGNOSIS — L821 Other seborrheic keratosis: Secondary | ICD-10-CM | POA: Diagnosis not present

## 2014-08-14 ENCOUNTER — Other Ambulatory Visit: Payer: Self-pay | Admitting: Internal Medicine

## 2014-08-20 ENCOUNTER — Other Ambulatory Visit: Payer: Self-pay | Admitting: Internal Medicine

## 2014-08-25 DIAGNOSIS — H47092 Other disorders of optic nerve, not elsewhere classified, left eye: Secondary | ICD-10-CM | POA: Diagnosis not present

## 2014-08-25 DIAGNOSIS — H43811 Vitreous degeneration, right eye: Secondary | ICD-10-CM | POA: Diagnosis not present

## 2014-09-14 DIAGNOSIS — M19012 Primary osteoarthritis, left shoulder: Secondary | ICD-10-CM | POA: Diagnosis not present

## 2014-09-14 DIAGNOSIS — M25511 Pain in right shoulder: Secondary | ICD-10-CM | POA: Diagnosis not present

## 2014-09-24 ENCOUNTER — Other Ambulatory Visit: Payer: Self-pay | Admitting: Internal Medicine

## 2014-10-13 ENCOUNTER — Other Ambulatory Visit (HOSPITAL_COMMUNITY): Payer: Self-pay | Admitting: Cardiology

## 2014-10-13 DIAGNOSIS — N400 Enlarged prostate without lower urinary tract symptoms: Secondary | ICD-10-CM | POA: Diagnosis not present

## 2014-10-15 ENCOUNTER — Other Ambulatory Visit (HOSPITAL_COMMUNITY): Payer: Self-pay | Admitting: Cardiology

## 2014-10-15 DIAGNOSIS — I6523 Occlusion and stenosis of bilateral carotid arteries: Secondary | ICD-10-CM

## 2014-10-22 ENCOUNTER — Encounter (HOSPITAL_COMMUNITY): Payer: No Typology Code available for payment source

## 2014-10-22 ENCOUNTER — Ambulatory Visit (HOSPITAL_COMMUNITY): Payer: Medicare Other | Attending: Internal Medicine | Admitting: Cardiology

## 2014-10-22 DIAGNOSIS — E785 Hyperlipidemia, unspecified: Secondary | ICD-10-CM | POA: Insufficient documentation

## 2014-10-22 DIAGNOSIS — I6523 Occlusion and stenosis of bilateral carotid arteries: Secondary | ICD-10-CM

## 2014-10-22 DIAGNOSIS — I10 Essential (primary) hypertension: Secondary | ICD-10-CM | POA: Insufficient documentation

## 2014-10-22 DIAGNOSIS — Z87891 Personal history of nicotine dependence: Secondary | ICD-10-CM | POA: Insufficient documentation

## 2014-10-22 NOTE — Progress Notes (Signed)
Carotid duplex performed 

## 2014-10-26 DIAGNOSIS — M19012 Primary osteoarthritis, left shoulder: Secondary | ICD-10-CM | POA: Diagnosis not present

## 2014-11-02 DIAGNOSIS — H3562 Retinal hemorrhage, left eye: Secondary | ICD-10-CM | POA: Diagnosis not present

## 2014-11-02 DIAGNOSIS — H47092 Other disorders of optic nerve, not elsewhere classified, left eye: Secondary | ICD-10-CM | POA: Diagnosis not present

## 2014-11-02 DIAGNOSIS — H43811 Vitreous degeneration, right eye: Secondary | ICD-10-CM | POA: Diagnosis not present

## 2014-11-13 ENCOUNTER — Other Ambulatory Visit: Payer: Self-pay | Admitting: Internal Medicine

## 2015-01-04 ENCOUNTER — Other Ambulatory Visit: Payer: Self-pay | Admitting: Internal Medicine

## 2015-03-08 DIAGNOSIS — S92325A Nondisplaced fracture of second metatarsal bone, left foot, initial encounter for closed fracture: Secondary | ICD-10-CM | POA: Diagnosis not present

## 2015-03-15 DIAGNOSIS — S92325D Nondisplaced fracture of second metatarsal bone, left foot, subsequent encounter for fracture with routine healing: Secondary | ICD-10-CM | POA: Diagnosis not present

## 2015-03-23 DIAGNOSIS — D1801 Hemangioma of skin and subcutaneous tissue: Secondary | ICD-10-CM | POA: Diagnosis not present

## 2015-03-23 DIAGNOSIS — D2362 Other benign neoplasm of skin of left upper limb, including shoulder: Secondary | ICD-10-CM | POA: Diagnosis not present

## 2015-03-23 DIAGNOSIS — L853 Xerosis cutis: Secondary | ICD-10-CM | POA: Diagnosis not present

## 2015-03-23 DIAGNOSIS — L570 Actinic keratosis: Secondary | ICD-10-CM | POA: Diagnosis not present

## 2015-03-23 DIAGNOSIS — L821 Other seborrheic keratosis: Secondary | ICD-10-CM | POA: Diagnosis not present

## 2015-03-23 DIAGNOSIS — Z85828 Personal history of other malignant neoplasm of skin: Secondary | ICD-10-CM | POA: Diagnosis not present

## 2015-03-30 DIAGNOSIS — S92325D Nondisplaced fracture of second metatarsal bone, left foot, subsequent encounter for fracture with routine healing: Secondary | ICD-10-CM | POA: Diagnosis not present

## 2015-04-13 DIAGNOSIS — S92325D Nondisplaced fracture of second metatarsal bone, left foot, subsequent encounter for fracture with routine healing: Secondary | ICD-10-CM | POA: Diagnosis not present

## 2015-05-04 DIAGNOSIS — S92325D Nondisplaced fracture of second metatarsal bone, left foot, subsequent encounter for fracture with routine healing: Secondary | ICD-10-CM | POA: Diagnosis not present

## 2015-05-19 ENCOUNTER — Other Ambulatory Visit: Payer: Self-pay | Admitting: Internal Medicine

## 2015-05-31 ENCOUNTER — Other Ambulatory Visit: Payer: Self-pay | Admitting: Internal Medicine

## 2015-06-11 DIAGNOSIS — Z23 Encounter for immunization: Secondary | ICD-10-CM | POA: Diagnosis not present

## 2015-06-28 DIAGNOSIS — Z961 Presence of intraocular lens: Secondary | ICD-10-CM | POA: Diagnosis not present

## 2015-07-12 DIAGNOSIS — S92325D Nondisplaced fracture of second metatarsal bone, left foot, subsequent encounter for fracture with routine healing: Secondary | ICD-10-CM | POA: Diagnosis not present

## 2015-07-19 ENCOUNTER — Other Ambulatory Visit: Payer: Self-pay | Admitting: Internal Medicine

## 2015-07-21 ENCOUNTER — Encounter: Payer: Self-pay | Admitting: *Deleted

## 2015-08-06 DIAGNOSIS — M5136 Other intervertebral disc degeneration, lumbar region: Secondary | ICD-10-CM | POA: Diagnosis not present

## 2015-08-06 DIAGNOSIS — Z6830 Body mass index (BMI) 30.0-30.9, adult: Secondary | ICD-10-CM | POA: Diagnosis not present

## 2015-08-06 DIAGNOSIS — M47816 Spondylosis without myelopathy or radiculopathy, lumbar region: Secondary | ICD-10-CM | POA: Diagnosis not present

## 2015-08-06 DIAGNOSIS — M549 Dorsalgia, unspecified: Secondary | ICD-10-CM | POA: Diagnosis not present

## 2015-08-06 DIAGNOSIS — Z981 Arthrodesis status: Secondary | ICD-10-CM | POA: Diagnosis not present

## 2015-08-06 DIAGNOSIS — M546 Pain in thoracic spine: Secondary | ICD-10-CM | POA: Diagnosis not present

## 2015-09-15 ENCOUNTER — Other Ambulatory Visit: Payer: Self-pay | Admitting: Internal Medicine

## 2015-09-27 ENCOUNTER — Ambulatory Visit (INDEPENDENT_AMBULATORY_CARE_PROVIDER_SITE_OTHER): Payer: Medicare Other | Admitting: Internal Medicine

## 2015-09-27 ENCOUNTER — Encounter: Payer: Self-pay | Admitting: Internal Medicine

## 2015-09-27 VITALS — BP 130/80 | HR 63 | Temp 97.5°F | Resp 20 | Ht 68.5 in | Wt 214.0 lb

## 2015-09-27 DIAGNOSIS — E782 Mixed hyperlipidemia: Secondary | ICD-10-CM

## 2015-09-27 DIAGNOSIS — M150 Primary generalized (osteo)arthritis: Secondary | ICD-10-CM

## 2015-09-27 DIAGNOSIS — Z860100 Personal history of colon polyps, unspecified: Secondary | ICD-10-CM

## 2015-09-27 DIAGNOSIS — I10 Essential (primary) hypertension: Secondary | ICD-10-CM

## 2015-09-27 DIAGNOSIS — M8949 Other hypertrophic osteoarthropathy, multiple sites: Secondary | ICD-10-CM

## 2015-09-27 DIAGNOSIS — Z0000 Encounter for general adult medical examination without abnormal findings: Secondary | ICD-10-CM

## 2015-09-27 DIAGNOSIS — M159 Polyosteoarthritis, unspecified: Secondary | ICD-10-CM

## 2015-09-27 LAB — LIPID PANEL
Cholesterol: 137 mg/dL (ref 0–200)
HDL: 53.6 mg/dL (ref >39.00)
LDL Cholesterol: 59 mg/dL (ref 0–99)
NonHDL: 83.27
Total CHOL/HDL Ratio: 3
Triglycerides: 122 mg/dL (ref 0.0–149.0)
VLDL: 24.4 mg/dL (ref 0.0–40.0)

## 2015-09-27 LAB — COMPREHENSIVE METABOLIC PANEL
ALT: 14 U/L (ref 0–53)
AST: 14 U/L (ref 0–37)
Albumin: 4.6 g/dL (ref 3.5–5.2)
Alkaline Phosphatase: 73 U/L (ref 39–117)
BUN: 28 mg/dL — ABNORMAL HIGH (ref 6–23)
CO2: 27 mEq/L (ref 19–32)
Calcium: 9.5 mg/dL (ref 8.4–10.5)
Chloride: 104 mEq/L (ref 96–112)
Creatinine, Ser: 1.17 mg/dL (ref 0.40–1.50)
GFR: 63.21 mL/min (ref >60.00)
Glucose, Bld: 105 mg/dL — ABNORMAL HIGH (ref 70–99)
Potassium: 4.7 mEq/L (ref 3.5–5.1)
Sodium: 139 mEq/L (ref 135–145)
Total Bilirubin: 0.8 mg/dL (ref 0.2–1.2)
Total Protein: 6.6 g/dL (ref 6.0–8.3)

## 2015-09-27 LAB — TSH: TSH: 2.82 u[IU]/mL (ref 0.35–4.50)

## 2015-09-27 LAB — CBC WITH DIFFERENTIAL/PLATELET
Basophils Absolute: 0 10*3/uL (ref 0.0–0.1)
Basophils Relative: 0.5 % (ref 0.0–3.0)
Eosinophils Absolute: 0.2 10*3/uL (ref 0.0–0.7)
Eosinophils Relative: 4.4 % (ref 0.0–5.0)
HCT: 41 % (ref 39.0–52.0)
Hemoglobin: 14 g/dL (ref 13.0–17.0)
Lymphocytes Relative: 29.8 % (ref 12.0–46.0)
Lymphs Abs: 1.5 10*3/uL (ref 0.7–4.0)
MCHC: 34.1 g/dL (ref 30.0–36.0)
MCV: 90.9 fl (ref 78.0–100.0)
Monocytes Absolute: 0.4 10*3/uL (ref 0.1–1.0)
Monocytes Relative: 7.7 % (ref 3.0–12.0)
Neutro Abs: 2.9 10*3/uL (ref 1.4–7.7)
Neutrophils Relative %: 57.6 % (ref 43.0–77.0)
Platelets: 167 10*3/uL (ref 150.0–400.0)
RBC: 4.5 Mil/uL (ref 4.22–5.81)
RDW: 14 % (ref 11.5–15.5)
WBC: 5 10*3/uL (ref 4.0–10.5)

## 2015-09-27 NOTE — Progress Notes (Signed)
Pre visit review using our clinic review tool, if applicable. No additional management support is needed unless otherwise documented below in the visit note. 

## 2015-09-27 NOTE — Patient Instructions (Signed)
Limit your sodium (Salt) intake  Please check your blood pressure on a regular basis.  If it is consistently greater than 150/90, please make an office appointment.    It is important that you exercise regularly, at least 20 minutes 3 to 4 times per week.  If you develop chest pain or shortness of breath seek  medical attention.  Urology follow-up as scheduled  Return in one year for follow-up

## 2015-09-27 NOTE — Progress Notes (Signed)
Subjective:    Patient ID: Joshua Lopez, male    DOB: 07-09-1930, 82 y.o.   MRN: [id number]  HPI 82 year old patient who is seen today for a preventive health examination.  History of hypertension, and a history of dyslipidemia.  Carotid artery Doppler evaluation in March 2016 revealed only mild bilateral ICA stenosis of less than 39%.  Subclavian arteries were normal.  Remains on statin therapy, which he continues to tolerate.   He has a history of osteoarthritis and is status post a number of orthopedic procedures, including a lumbar laminectomy for spinal stenosis.  He has a history of allergic rhinitis and nasal polyps.  He is on chronic PPI therapy due to reflux and prior esophageal stricture.  He has BPH and a history of elevated PSA and is followed by urology.  He is doing quite well today  He has seen Dr. Haroldine Laws in  the past for polypectomy. He also has a history of colonic polyps. Former tobacco user, but discontinued in 1974 or 1975  Past Medical History  Diagnosis Date  . Esophageal stricture   . Hypertension   . BPH (benign prostatic hypertrophy)     Dr. Isabel Caprice monitors  elevated PSA  . Colon polyp     2006 and 2010  . Hyperlipidemia     NMR  2007  . Subclavian steal syndrome 1997  . GERD (gastroesophageal reflux disease)   . Arthritis     diffuse  . Chronic lower back pain 05-10-12    States pain is related to hip    Social History   Social History  . Marital Status: Married    Spouse Name: N/A  . Number of Children: N/A  . Years of Education: N/A   Occupational History  . Not on file.   Social History Main Topics  . Smoking status: Former Smoker -- 0.50 packs/day for 33 years    Types: Cigarettes    Quit date: 07/24/1973  . Smokeless tobacco: Former Neurosurgeon    Types: Chew    Quit date: 07/24/1974     Comment: smoked 1949- 1975 , up to 1/3 ppd  . Alcohol Use: No  . Drug Use: No  . Sexual Activity: Yes   Other Topics Concern  . Not on file    Social History Narrative    Past Surgical History  Procedure Laterality Date  . Subclavian stent placement  1997    right side  . Prostate biopsy  2000    Dr. Earlene Plater  . Esophageal dilation  2003  . Colonoscopy w/ polypectomy  2006 & 2010  . Ganglion cyst excision  1999    LUE  . Total shoulder arthroplasty  2010    Dr. Dion Saucier; right  . Knee arthroscopy  2001    left  . Joint replacement    . Cataract extraction w/ intraocular lens  implant, bilateral    . Posterior fusion lumbar spine  02/08/12    L2-L4  . Back surgery  05-10-12    lumbar fusion with hardware  . Total hip arthroplasty  05/14/2012    Procedure: TOTAL HIP ARTHROPLASTY ANTERIOR APPROACH;  Surgeon: Kathryne Hitch, MD;  Location: [facility];  Service: Orthopedics;  Laterality: Right;  Right Total Hip Arthroplasty, Anterior Approach (C-Arm)  . Ganglion cyst excision  05/14/2012    Procedure: REMOVAL GANGLION OF WRIST;  Surgeon: Kathryne Hitch, MD;  Location: [facility];  Service: Orthopedics;  Laterality: Right;  Excision Right Wrist Ganglion Cyst  Family History  Problem Relation Age of Onset  . Sudden death Father     murdered  . Hypertension Mother   . Skin cancer Mother   . Skin cancer Brother   . Hypertension Brother   . Coronary artery disease Brother     CABG  . Colon cancer Paternal Uncle   . Breast cancer Paternal Grandmother   . Breast cancer Paternal Aunt   . Colon cancer Paternal Aunt   . Heart attack Paternal Uncle   . Kidney disease Neg Hx   . Diabetes Neg Hx     Allergies  Allergen Reactions  . Aspirin Rash and Other (See Comments)    Dyspnea, eyelid edema, truncal urticaria--Patient tolerates other NSAIDS at home Because of a history of documented adverse serious drug reaction;Medi Alert bracelet  is recommended    Current Outpatient Prescriptions on File Prior to Visit  Medication Sig Dispense Refill  . acetaminophen (TYLENOL) 500 MG tablet Take 500 mg by mouth every 6  (six) hours as needed. For pain    . amLODipine (NORVASC) 5 MG tablet TAKE 1 TABLET EVERY MORNING (NEED PHYSICAL IN DECEMBER) 90 tablet 0  . Calcium Carbonate-Vitamin D (CALTRATE 600+D PO) Take 1 tablet by mouth 2 (two) times daily.     Marland Kitchen docusate sodium (COLACE) 100 MG capsule Take 100 mg by mouth at bedtime as needed.     . fluticasone (FLONASE) 50 MCG/ACT nasal spray 1 spray in each nostril twice a day as needed. Use the "crossover" technique as discussed 16 g 2  . losartan-hydrochlorothiazide (HYZAAR) 100-12.5 MG tablet TAKE 1 TABLET EVERY DAY 90 tablet 0  . meloxicam (MOBIC) 7.5 MG tablet     . montelukast (SINGULAIR) 10 MG tablet TAKE 1 TABLET BY MOUTH DAILY AT BEDTIME 90 tablet 3  . pantoprazole (PROTONIX) 40 MG tablet TAKE ONE TABLET  EVERY DAY 30 MINUTES BEFORE A MEAL.  NEED PHYSICAL IN DECEMBER 90 tablet 0  . polyethylene glycol (MIRALAX / GLYCOLAX) packet Take 17 g by mouth 2 (two) times daily.     . pravastatin (PRAVACHOL) 20 MG tablet TAKE 1 TABLET AT BEDTIME 90 tablet 1  . SAW PALMETTO, SERENOA REPENS, PO Take 1 tablet by mouth every morning.     . tamsulosin (FLOMAX) 0.4 MG CAPS capsule Take 1 capsule (0.4 mg total) by mouth at bedtime. 90 capsule 3   No current facility-administered medications on file prior to visit.    There were no vitals taken for this visit.    1. Risk factors, based on past  M,S,F history.   cardiovascular risk factors include hypertension, dyslipidemia and peripheral vascular disease  2.  Physical activities: limited due to arthritis.  Status post hip replacement surgery no major limitations  3.  Depression/mood:no history depression and mood disorder  4.  Hearing:mild deficit only  5.  ADL's:independent  6.  Fall risk:low  7.  Home safety:no problems identified  8.  Height weight, and visual acuity;height and weight stable no change in visual acuity.  Followed closely by ophthalmology due to a small left retinal hemorrhage  9.   Counseling:heart healthy diet recommended.  Modest weight loss encouraged  10. Lab orders based on risk factors:laboratory profile, including lipid panel will be reviewed  11. Referral :follow-up urology and ophthalmology  12. Care plan:continue heart healthy diet and efforts at weight loss  13. Cognitive assessment: alert and oriented with normal affect.  No cognitive dysfunction  14. Screening:we'll continue annual health examinations.  Will continue annual health examinations with screening lab.  Will continue to be seen by urology annually.  We'll consider for carotid artery Doppler evaluation every 2 years   15. Provider List Update: ophthalmology, urology primary care and radiology, as well as orthopedics     Review of Systems  Constitutional: Negative for fever, chills, activity change, appetite change and fatigue.  HENT: Positive for postnasal drip and rhinorrhea. Negative for congestion, dental problem, ear pain, hearing loss, mouth sores, sinus pressure, sneezing, tinnitus, trouble swallowing and voice change.   Eyes: Negative for photophobia, pain, redness and visual disturbance.  Respiratory: Negative for apnea, cough, choking, chest tightness, shortness of breath and wheezing.   Cardiovascular: Negative for chest pain, palpitations and leg swelling.  Gastrointestinal: Negative for nausea, vomiting, abdominal pain, diarrhea, constipation, blood in stool, abdominal distention, anal bleeding and rectal pain.  Genitourinary: Negative for dysuria, urgency, frequency, hematuria, flank pain, decreased urine volume, discharge, penile swelling, scrotal swelling, difficulty urinating, genital sores and testicular pain.  Musculoskeletal: Negative for myalgias, back pain, joint swelling, arthralgias, gait problem, neck pain and neck stiffness.  Skin: Negative for color change, rash and wound.  Neurological: Negative for dizziness, tremors, seizures, syncope, facial asymmetry, speech  difficulty, weakness, light-headedness, numbness and headaches.  Hematological: Negative for adenopathy. Does not bruise/bleed easily.  Psychiatric/Behavioral: Negative for suicidal ideas, hallucinations, behavioral problems, confusion, sleep disturbance, self-injury, dysphoric mood, decreased concentration and agitation. The patient is not nervous/anxious.        Objective:   Physical Exam  Constitutional: He appears well-developed and well-nourished.  HENT:  Head: Normocephalic and atraumatic.  Right Ear: External ear normal.  Left Ear: External ear normal.  Nose: Nose normal.  Mouth/Throat: Oropharynx is clear and moist.  Wax right canal  Eyes: Conjunctivae and EOM are normal. Pupils are equal, round, and reactive to light. No scleral icterus.  Neck: Normal range of motion. Neck supple. No JVD present. No thyromegaly present.  Right carotid and right supraclavicular bruit  Cardiovascular: Regular rhythm, normal heart sounds and intact distal pulses.  Exam reveals no gallop and no friction rub.   No murmur heard. Dorsalis pedis pulses full.  Posterior tibial pulses faint  Pulmonary/Chest: Effort normal and breath sounds normal. He exhibits no tenderness.  Abdominal: Soft. Bowel sounds are normal. He exhibits no distension and no mass. There is no tenderness.  Ventral hernia.  Obese  Genitourinary: Penis normal.  Musculoskeletal: Normal range of motion. He exhibits no edema or tenderness.  Lymphadenopathy:    He has no cervical adenopathy.  Neurological: He is alert. He has normal reflexes. No cranial nerve deficit. Coordination normal.  Skin: Skin is warm and dry. No rash noted.  Scar right shoulder  Psychiatric: He has a normal mood and affect. His behavior is normal.          Assessment & Plan:  Preventive health examination Hypertension well controlled  Dyslipidemia.  Will check a lipid profile History of esophageal stricture.  Continue PPI therapy BPH and history of  elevated PSA.  Followup urology.  Scheduled March of each year DJD  We'll check laboratory update Recheck one year or as needed

## 2015-10-04 ENCOUNTER — Other Ambulatory Visit: Payer: Self-pay | Admitting: Internal Medicine

## 2015-10-18 ENCOUNTER — Other Ambulatory Visit: Payer: Self-pay | Admitting: Internal Medicine

## 2015-10-25 DIAGNOSIS — Z0000 Encounter for general adult medical examination without abnormal findings: Secondary | ICD-10-CM | POA: Diagnosis not present

## 2015-10-25 DIAGNOSIS — R351 Nocturia: Secondary | ICD-10-CM | POA: Diagnosis not present

## 2015-11-23 ENCOUNTER — Other Ambulatory Visit: Payer: Self-pay | Admitting: Internal Medicine

## 2015-11-24 ENCOUNTER — Other Ambulatory Visit: Payer: Self-pay | Admitting: Internal Medicine

## 2016-01-26 ENCOUNTER — Ambulatory Visit (INDEPENDENT_AMBULATORY_CARE_PROVIDER_SITE_OTHER): Payer: Medicare Other | Admitting: Family Medicine

## 2016-01-26 ENCOUNTER — Telehealth: Payer: Self-pay | Admitting: Internal Medicine

## 2016-01-26 ENCOUNTER — Encounter: Payer: Self-pay | Admitting: Family Medicine

## 2016-01-26 VITALS — BP 118/82 | HR 82 | Temp 97.7°F | Ht 68.5 in | Wt 209.0 lb

## 2016-01-26 DIAGNOSIS — I959 Hypotension, unspecified: Secondary | ICD-10-CM

## 2016-01-26 NOTE — Telephone Encounter (Signed)
Patient Name: Joshua Lopez  DOB: 1930/03/11    Initial Comment Husband's BP is 95/59 , feeling dizzy spells.    Nurse Assessment  Nurse: Mallie Mussel, RN, Alveta Heimlich Date/Time Eilene Ghazi Time): 01/26/2016 2:12:40 PM  Confirm and document reason for call. If symptomatic, describe symptoms. You must click the next button to save text entered. ---Caller states that her husband's BP was 95/59 a little over an hour ago. He has been having dizzy spells for a long time, but they've been worse the last 2 days. Current reading is 106/62. When he has the dizzy spell, he has to hold onto something. The dizziness only lasts a few seconds at a time.  Has the patient traveled out of the country within the last 30 days? ---No  Does the patient have any new or worsening symptoms? ---Yes  Will a triage be completed? ---Yes  Related visit to physician within the last 2 weeks? ---No  Does the PT have any chronic conditions? (i.e. diabetes, asthma, etc.) ---Yes  List chronic conditions. ---HTN, Reflux, Hypercholesterolemia, Allergies, BPH  Is this a behavioral health or substance abuse call? ---No     Guidelines    Guideline Title Affirmed Question Affirmed Notes  Low Blood Pressure [id number] Systolic BP [id number] AND [id number] taking blood pressure medications AND [3] dizzy, lightheaded or weak    Final Disposition User   See Physician within 4 Hours (or PCP triage) Mallie Mussel, RN, Alveta Heimlich    Comments  usually runs around 121-125/70 something  No appointments available for Dr. Raliegh Ip today. I scheduled him to be seen by Dr. Carolann Littler at 5:00pm today.   Referrals  REFERRED TO PCP OFFICE   Disagree/Comply: Comply

## 2016-01-26 NOTE — Progress Notes (Signed)
Subjective:    Patient ID: Joshua Lopez, male    DOB: Oct 11, 1929, 82 y.o.   MRN: [id number]  HPI   Patient seen with some recent low blood pressure readings. Has hypertension treated with amlodipine and losartan HCTZ. Stays very active and has been doing a lot of outside work recently. Yesterday felt lightheaded and had couple of low blood pressure readings including 82/53 and 93/57. This morning had blood pressure 95/59. He has continued to take his usual dose of medications. Denies any nausea or vomiting. No diarrhea. Recent labs including hemoglobin normal. Good appetite. Lightheadedness and dizziness are worse when he first stands up. He thinks he is drinking plenty of fluids. No palpitations or chest pains. No dyspnea.  Past Medical History  Diagnosis Date  . Esophageal stricture   . Hypertension   . BPH (benign prostatic hypertrophy)     Dr. Isabel Caprice monitors  elevated PSA  . Colon polyp     2006 and 2010  . Hyperlipidemia     NMR  2007  . Subclavian steal syndrome 1997  . GERD (gastroesophageal reflux disease)   . Arthritis     diffuse  . Chronic lower back pain 05-10-12    States pain is related to hip   Past Surgical History  Procedure Laterality Date  . Subclavian stent placement  1997    right side  . Prostate biopsy  2000    Dr. Earlene Plater  . Esophageal dilation  2003  . Colonoscopy w/ polypectomy  2006 & 2010  . Ganglion cyst excision  1999    LUE  . Total shoulder arthroplasty  2010    Dr. Dion Saucier; right  . Knee arthroscopy  2001    left  . Joint replacement    . Cataract extraction w/ intraocular lens  implant, bilateral    . Posterior fusion lumbar spine  02/08/12    L2-L4  . Back surgery  05-10-12    lumbar fusion with hardware  . Total hip arthroplasty  05/14/2012    Procedure: TOTAL HIP ARTHROPLASTY ANTERIOR APPROACH;  Surgeon: Kathryne Hitch, MD;  Location: [facility];  Service: Orthopedics;  Laterality: Right;  Right Total Hip Arthroplasty,  Anterior Approach (C-Arm)  . Ganglion cyst excision  05/14/2012    Procedure: REMOVAL GANGLION OF WRIST;  Surgeon: Kathryne Hitch, MD;  Location: [facility];  Service: Orthopedics;  Laterality: Right;  Excision Right Wrist Ganglion Cyst    reports that he quit smoking about 42 years ago. His smoking use included Cigarettes. He has a 16.5 pack-year smoking history. He quit smokeless tobacco use about 41 years ago. His smokeless tobacco use included Chew. He reports that he does not drink alcohol or use illicit drugs. family history includes Breast cancer in his paternal aunt and paternal grandmother; Colon cancer in his paternal aunt and paternal uncle; Coronary artery disease in his brother; Heart attack in his paternal uncle; Hypertension in his brother and mother; Skin cancer in his brother and mother; Sudden death in his father. There is no history of Kidney disease or Diabetes. Allergies  Allergen Reactions  . Aspirin Rash and Other (See Comments)    Dyspnea, eyelid edema, truncal urticaria--Patient tolerates other NSAIDS at home Because of a history of documented adverse serious drug reaction;Medi Alert bracelet  is recommended      Review of Systems  Constitutional: Negative for fever, chills and appetite change.  Respiratory: Negative for cough and shortness of breath.   Cardiovascular: Negative for  chest pain, palpitations and leg swelling.  Gastrointestinal: Negative for nausea, vomiting, abdominal pain and diarrhea.  Genitourinary: Negative for dysuria.  Musculoskeletal: Negative for back pain.  Neurological: Positive for dizziness and light-headedness. Negative for syncope.  Psychiatric/Behavioral: Negative for confusion.       Objective:   Physical Exam  Constitutional: He is oriented to person, place, and time. He appears well-developed and well-nourished.  HENT:  Mouth/Throat: Oropharynx is clear and moist.  Neck: Neck supple.  Cardiovascular: Normal rate and  regular rhythm.   Pulmonary/Chest: Effort normal and breath sounds normal. No respiratory distress. He has no wheezes. He has no rales.  Musculoskeletal: He exhibits no edema.  Neurological: He is alert and oriented to person, place, and time. No cranial nerve deficit. Coordination normal.  No focal weakness. Gait normal  Psychiatric: He has a normal mood and affect. His behavior is normal. Judgment and thought content normal.          Assessment & Plan:  Recent episodes of hypotension as above. Suspect related to mild dehydration. He sometimes works outside several hours per day. We've recommend he reduce losartan HCTZ to one half tablet daily over the next few days. Hold altogether for systolic blood pressure less than 100. Stay well-hydrated. Recommend follow-up with primary within the next couple weeks to reassess. Blood pressure repeat today seated 130/68 and standing 124/68  Kristian Covey MD [facility]

## 2016-01-26 NOTE — Progress Notes (Signed)
Pre visit review using our clinic review tool, if applicable. No additional management support is needed unless otherwise documented below in the visit note. 

## 2016-01-26 NOTE — Patient Instructions (Signed)
-  stay well hydrated -Try reducing Losartan to one half tablet daily -monitor blood pressure and go back to full dose of Losartan if BP consistently > 150/90.

## 2016-03-08 ENCOUNTER — Other Ambulatory Visit: Payer: Self-pay | Admitting: Internal Medicine

## 2016-03-24 DIAGNOSIS — L308 Other specified dermatitis: Secondary | ICD-10-CM | POA: Diagnosis not present

## 2016-03-24 DIAGNOSIS — L820 Inflamed seborrheic keratosis: Secondary | ICD-10-CM | POA: Diagnosis not present

## 2016-03-24 DIAGNOSIS — Z85828 Personal history of other malignant neoplasm of skin: Secondary | ICD-10-CM | POA: Diagnosis not present

## 2016-03-24 DIAGNOSIS — L821 Other seborrheic keratosis: Secondary | ICD-10-CM | POA: Diagnosis not present

## 2016-03-24 DIAGNOSIS — D1801 Hemangioma of skin and subcutaneous tissue: Secondary | ICD-10-CM | POA: Diagnosis not present

## 2016-04-04 ENCOUNTER — Ambulatory Visit (INDEPENDENT_AMBULATORY_CARE_PROVIDER_SITE_OTHER): Payer: Medicare Other | Admitting: Internal Medicine

## 2016-04-04 ENCOUNTER — Encounter: Payer: Self-pay | Admitting: Internal Medicine

## 2016-04-04 VITALS — BP 114/70 | HR 71 | Temp 97.7°F | Resp 20 | Ht 68.5 in | Wt 208.0 lb

## 2016-04-04 DIAGNOSIS — I10 Essential (primary) hypertension: Secondary | ICD-10-CM

## 2016-04-04 DIAGNOSIS — J302 Other seasonal allergic rhinitis: Secondary | ICD-10-CM

## 2016-04-04 LAB — CBC WITH DIFFERENTIAL/PLATELET
Basophils Absolute: 0 10*3/uL (ref 0.0–0.1)
Basophils Relative: 0.3 % (ref 0.0–3.0)
Eosinophils Absolute: 0.2 10*3/uL (ref 0.0–0.7)
Eosinophils Relative: 2.5 % (ref 0.0–5.0)
HCT: 40.2 % (ref 39.0–52.0)
Hemoglobin: 13.8 g/dL (ref 13.0–17.0)
Lymphocytes Relative: 22.6 % (ref 12.0–46.0)
Lymphs Abs: 1.4 10*3/uL (ref 0.7–4.0)
MCHC: 34.4 g/dL (ref 30.0–36.0)
MCV: 91.5 fl (ref 78.0–100.0)
Monocytes Absolute: 0.5 10*3/uL (ref 0.1–1.0)
Monocytes Relative: 7.5 % (ref 3.0–12.0)
Neutro Abs: 4.1 10*3/uL (ref 1.4–7.7)
Neutrophils Relative %: 67.1 % (ref 43.0–77.0)
Platelets: 171 10*3/uL (ref 150.0–400.0)
RBC: 4.39 Mil/uL (ref 4.22–5.81)
RDW: 13.3 % (ref 11.5–15.5)
WBC: 6.1 10*3/uL (ref 4.0–10.5)

## 2016-04-04 LAB — BASIC METABOLIC PANEL
BUN: 24 mg/dL — ABNORMAL HIGH (ref 6–23)
CO2: 28 mEq/L (ref 19–32)
Calcium: 8.9 mg/dL (ref 8.4–10.5)
Chloride: 104 mEq/L (ref 96–112)
Creatinine, Ser: 1.19 mg/dL (ref 0.40–1.50)
GFR: 61.91 mL/min (ref >60.00)
Glucose, Bld: 80 mg/dL (ref 70–99)
Potassium: 4.1 mEq/L (ref 3.5–5.1)
Sodium: 137 mEq/L (ref 135–145)

## 2016-04-04 MED ORDER — LOSARTAN POTASSIUM 25 MG PO TABS: 25.0000 mg | ORAL_TABLET | Freq: Every day | ORAL | 3 refills | Status: DC

## 2016-04-04 NOTE — Progress Notes (Signed)
Pre visit review using our clinic review tool, if applicable. No additional management support is needed unless otherwise documented below in the visit note. 

## 2016-04-04 NOTE — Patient Instructions (Signed)
Discontinue losartan/hydrochlorothiazide combination  Return in 2 months for follow-up  Please check your blood pressure on a regular basis.  If it is consistently greater than 150/90, please make an office appointment.

## 2016-04-04 NOTE — Progress Notes (Signed)
Subjective:    Patient ID: Joshua Lopez, male    DOB: August 10, 1929, 82 y.o.   MRN: [id number]  HPI 82 year old patient who is seen today in follow-up.  He was seen 2 months ago and evaluated for symptomatic orthostatic hypotension. Losartan hydrochlorothiazide dose was decreased by 1 half.  The patient did this just briefly but then resumed 1 full tablet daily.  Home blood pressure readings have been in a low-normal range with occasional hypotension.  Over the past 2 months.  Lowest blood pressure has been 91 over 58.  He does describe some occasional orthostatic symptoms but also mild nonspecific dizziness such as turning in bed.  His wife feels he falls asleep in front of TV much more often and isn't quite as active  Past Medical History:  Diagnosis Date  . Arthritis    diffuse  . BPH (benign prostatic hypertrophy)    Dr. Isabel Caprice monitors  elevated PSA  . Chronic lower back pain 05-10-12   States pain is related to hip  . Colon polyp    2006 and 2010  . Esophageal stricture   . GERD (gastroesophageal reflux disease)   . Hyperlipidemia    NMR  2007  . Hypertension   . Subclavian steal syndrome 1997     Social History   Social History  . Marital status: Married    Spouse name: N/A  . Number of children: N/A  . Years of education: N/A   Occupational History  . Not on file.   Social History Main Topics  . Smoking status: Former Smoker    Packs/day: 0.50    Years: 33.00    Types: Cigarettes    Quit date: 07/24/1973  . Smokeless tobacco: Former Neurosurgeon    Types: Chew    Quit date: 07/24/1974     Comment: smoked 1949- 1975 , up to 1/3 ppd  . Alcohol use No  . Drug use: No  . Sexual activity: Yes   Other Topics Concern  . Not on file   Social History Narrative  . No narrative on file    Past Surgical History:  Procedure Laterality Date  . BACK SURGERY  05-10-12   lumbar fusion with hardware  . CATARACT EXTRACTION W/ INTRAOCULAR LENS  IMPLANT, BILATERAL    .  COLONOSCOPY W/ POLYPECTOMY  2006 & 2010  . ESOPHAGEAL DILATION  2003  . GANGLION CYST EXCISION  1999   LUE  . GANGLION CYST EXCISION  05/14/2012   Procedure: REMOVAL GANGLION OF WRIST;  Surgeon: Kathryne Hitch, MD;  Location: [facility];  Service: Orthopedics;  Laterality: Right;  Excision Right Wrist Ganglion Cyst  . JOINT REPLACEMENT    . KNEE ARTHROSCOPY  2001   left  . POSTERIOR FUSION LUMBAR SPINE  02/08/12   L2-L4  . PROSTATE BIOPSY  2000   Dr. Earlene Plater  . SUBCLAVIAN STENT PLACEMENT  1997   right side  . TOTAL HIP ARTHROPLASTY  05/14/2012   Procedure: TOTAL HIP ARTHROPLASTY ANTERIOR APPROACH;  Surgeon: Kathryne Hitch, MD;  Location: [facility];  Service: Orthopedics;  Laterality: Right;  Right Total Hip Arthroplasty, Anterior Approach (C-Arm)  . TOTAL SHOULDER ARTHROPLASTY  2010   Dr. Dion Saucier; right    Family History  Problem Relation Age of Onset  . Sudden death Father     murdered  . Hypertension Mother   . Skin cancer Mother   . Skin cancer Brother   . Hypertension Brother   . Coronary artery  disease Brother     CABG  . Colon cancer Paternal Uncle   . Breast cancer Paternal Grandmother   . Breast cancer Paternal Aunt   . Colon cancer Paternal Aunt   . Heart attack Paternal Uncle   . Kidney disease Neg Hx   . Diabetes Neg Hx     Allergies  Allergen Reactions  . Aspirin Rash and Other (See Comments)    Dyspnea, eyelid edema, truncal urticaria--Patient tolerates other NSAIDS at home Because of a history of documented adverse serious drug reaction;Medi Alert bracelet  is recommended    Current Outpatient Prescriptions on File Prior to Visit  Medication Sig Dispense Refill  . acetaminophen (TYLENOL) 500 MG tablet Take 500 mg by mouth every 6 (six) hours as needed. For pain    . amLODipine (NORVASC) 5 MG tablet Take 1 tablet (5 mg total) by mouth daily. 90 tablet 1  . Calcium Carbonate-Vitamin D (CALTRATE 600+D PO) Take 1 tablet by mouth 2 (two) times daily.      . fluticasone (FLONASE) 50 MCG/ACT nasal spray 1 spray in each nostril twice a day as needed. Use the "crossover" technique as discussed 16 g 2  . losartan-hydrochlorothiazide (HYZAAR) 100-12.5 MG tablet TAKE 1 TABLET EVERY DAY 90 tablet 1  . meloxicam (MOBIC) 7.5 MG tablet     . montelukast (SINGULAIR) 10 MG tablet TAKE 1 TABLET BY MOUTH DAILY AT BEDTIME 90 tablet 3  . pantoprazole (PROTONIX) 40 MG tablet Take 1 tablet (40 mg total) by mouth daily. 90 tablet 1  . polyethylene glycol (MIRALAX / GLYCOLAX) packet Take 17 g by mouth 2 (two) times daily.     . pravastatin (PRAVACHOL) 20 MG tablet TAKE 1 TABLET AT BEDTIME (NEED PHYSICAL) 90 tablet 1  . SAW PALMETTO, SERENOA REPENS, PO Take 1 tablet by mouth every morning.     . tamsulosin (FLOMAX) 0.4 MG CAPS capsule TAKE 1 CAPSULE AT BEDTIME 90 capsule 3   No current facility-administered medications on file prior to visit.     BP 114/70 (BP Location: Left Arm, Patient Position: Sitting, Cuff Size: Normal)   Pulse 71   Temp 97.7 F (36.5 C) (Oral)   Resp 20   Ht 5' 8.5" (1.74 m)   Wt 208 lb (94.3 kg)   SpO2 98%   BMI 31.17 kg/m      Review of Systems  Constitutional: Positive for fatigue. Negative for appetite change, chills and fever.  HENT: Negative for congestion, dental problem, ear pain, hearing loss, sore throat, tinnitus, trouble swallowing and voice change.   Eyes: Negative for pain, discharge and visual disturbance.  Respiratory: Negative for cough, chest tightness, wheezing and stridor.   Cardiovascular: Negative for chest pain, palpitations and leg swelling.  Gastrointestinal: Negative for abdominal distention, abdominal pain, blood in stool, constipation, diarrhea, nausea and vomiting.  Genitourinary: Negative for difficulty urinating, discharge, flank pain, genital sores, hematuria and urgency.  Musculoskeletal: Negative for arthralgias, back pain, gait problem, joint swelling, myalgias and neck stiffness.  Skin:  Negative for rash.  Neurological: Positive for dizziness and weakness. Negative for syncope, speech difficulty, numbness and headaches.  Hematological: Negative for adenopathy. Does not bruise/bleed easily.  Psychiatric/Behavioral: Negative for behavioral problems and dysphoric mood. The patient is not nervous/anxious.        Objective:   Physical Exam  Constitutional: He is oriented to person, place, and time. He appears well-developed.  Blood pressure on arrival 114 over 70  Repeat blood pressure 118/80 sitting And  110 over 70 standing  HENT:  Head: Normocephalic.  Right Ear: External ear normal.  Left Ear: External ear normal.  Eyes: Conjunctivae and EOM are normal.  Neck: Normal range of motion.  Cardiovascular: Normal rate and normal heart sounds.   Pulmonary/Chest: Breath sounds normal.  Abdominal: Bowel sounds are normal.  Musculoskeletal: Normal range of motion. He exhibits no edema or tenderness.  Neurological: He is alert and oriented to person, place, and time.  Psychiatric: He has a normal mood and affect. His behavior is normal.          Assessment & Plan:   Orthostatic hypotension. We'll discontinue hydrochlorothiazide and decrease losartan to 25 mg daily Check CBC and basic metabolic profile  Follow-up 2 months  Rogelia Boga

## 2016-06-05 ENCOUNTER — Other Ambulatory Visit: Payer: Self-pay | Admitting: Internal Medicine

## 2016-06-05 ENCOUNTER — Encounter: Payer: Self-pay | Admitting: Internal Medicine

## 2016-06-05 ENCOUNTER — Ambulatory Visit (INDEPENDENT_AMBULATORY_CARE_PROVIDER_SITE_OTHER): Payer: Medicare Other | Admitting: Internal Medicine

## 2016-06-05 VITALS — BP 160/90 | HR 73 | Temp 97.5°F | Ht 68.5 in | Wt 209.0 lb

## 2016-06-05 DIAGNOSIS — I10 Essential (primary) hypertension: Secondary | ICD-10-CM

## 2016-06-05 DIAGNOSIS — M8949 Other hypertrophic osteoarthropathy, multiple sites: Secondary | ICD-10-CM

## 2016-06-05 DIAGNOSIS — M150 Primary generalized (osteo)arthritis: Secondary | ICD-10-CM

## 2016-06-05 DIAGNOSIS — Z23 Encounter for immunization: Secondary | ICD-10-CM

## 2016-06-05 DIAGNOSIS — M159 Polyosteoarthritis, unspecified: Secondary | ICD-10-CM

## 2016-06-05 DIAGNOSIS — E782 Mixed hyperlipidemia: Secondary | ICD-10-CM | POA: Diagnosis not present

## 2016-06-05 MED ORDER — SODIUM FLUORIDE 1.1 % DT PSTE: 1.0000 "application " | PASTE | Freq: Every day | DENTAL | 6 refills | Status: DC

## 2016-06-05 NOTE — Patient Instructions (Signed)
Limit your sodium (Salt) intake  Please check your blood pressure on a regular basis.  If it is consistently greater than 150/90, please make an office appointment.    It is important that you exercise regularly, at least 20 minutes 3 to 4 times per week.  If you develop chest pain or shortness of breath seek  medical attention.  Return in 6 months for follow-up  

## 2016-06-05 NOTE — Progress Notes (Signed)
Subjective:    Patient ID: Joshua Lopez, male    DOB: 11/04/1929, 82 y.o.   MRN: [id number]  HPI  82 year old patient who is seen today for his six-month follow-up.  He has a history of essential hypertension.  He has dyslipidemia and osteoarthritis.  In general doing quite well.  He also has a history of allergic rhinitis. Has a history of spinal stenosis and low back pain  He continues to monitor home blood pressure readings with very nice blood pressure control  Past Medical History:  Diagnosis Date  . Arthritis    diffuse  . BPH (benign prostatic hypertrophy)    Dr. Isabel Caprice monitors  elevated PSA  . Chronic lower back pain 05-10-12   States pain is related to hip  . Colon polyp    2006 and 2010  . Esophageal stricture   . GERD (gastroesophageal reflux disease)   . Hyperlipidemia    NMR  2007  . Hypertension   . Subclavian steal syndrome 1997     Social History   Social History  . Marital status: Married    Spouse name: N/A  . Number of children: N/A  . Years of education: N/A   Occupational History  . Not on file.   Social History Main Topics  . Smoking status: Former Smoker    Packs/day: 0.50    Years: 33.00    Types: Cigarettes    Quit date: 07/24/1973  . Smokeless tobacco: Former Neurosurgeon    Types: Chew    Quit date: 07/24/1974     Comment: smoked 1949- 1975 , up to 1/3 ppd  . Alcohol use No  . Drug use: No  . Sexual activity: Yes   Other Topics Concern  . Not on file   Social History Narrative  . No narrative on file    Past Surgical History:  Procedure Laterality Date  . BACK SURGERY  05-10-12   lumbar fusion with hardware  . CATARACT EXTRACTION W/ INTRAOCULAR LENS  IMPLANT, BILATERAL    . COLONOSCOPY W/ POLYPECTOMY  2006 & 2010  . ESOPHAGEAL DILATION  2003  . GANGLION CYST EXCISION  1999   LUE  . GANGLION CYST EXCISION  05/14/2012   Procedure: REMOVAL GANGLION OF WRIST;  Surgeon: Kathryne Hitch, MD;  Location: [facility];  Service:  Orthopedics;  Laterality: Right;  Excision Right Wrist Ganglion Cyst  . JOINT REPLACEMENT    . KNEE ARTHROSCOPY  2001   left  . POSTERIOR FUSION LUMBAR SPINE  02/08/12   L2-L4  . PROSTATE BIOPSY  2000   Dr. Earlene Plater  . SUBCLAVIAN STENT PLACEMENT  1997   right side  . TOTAL HIP ARTHROPLASTY  05/14/2012   Procedure: TOTAL HIP ARTHROPLASTY ANTERIOR APPROACH;  Surgeon: Kathryne Hitch, MD;  Location: [facility];  Service: Orthopedics;  Laterality: Right;  Right Total Hip Arthroplasty, Anterior Approach (C-Arm)  . TOTAL SHOULDER ARTHROPLASTY  2010   Dr. Dion Saucier; right    Family History  Problem Relation Age of Onset  . Sudden death Father     murdered  . Hypertension Mother   . Skin cancer Mother   . Skin cancer Brother   . Hypertension Brother   . Coronary artery disease Brother     CABG  . Colon cancer Paternal Uncle   . Breast cancer Paternal Grandmother   . Breast cancer Paternal Aunt   . Colon cancer Paternal Aunt   . Heart attack Paternal Uncle   .  Kidney disease Neg Hx   . Diabetes Neg Hx     Allergies  Allergen Reactions  . Aspirin Rash and Other (See Comments)    Dyspnea, eyelid edema, truncal urticaria--Patient tolerates other NSAIDS at home Because of a history of documented adverse serious drug reaction;Medi Alert bracelet  is recommended    Current Outpatient Prescriptions on File Prior to Visit  Medication Sig Dispense Refill  . acetaminophen (TYLENOL) 500 MG tablet Take 500 mg by mouth every 6 (six) hours as needed. For pain    . amLODipine (NORVASC) 5 MG tablet Take 1 tablet (5 mg total) by mouth daily. 90 tablet 1  . Calcium Carbonate-Vitamin D (CALTRATE 600+D PO) Take 1 tablet by mouth 2 (two) times daily.     . fluticasone (FLONASE) 50 MCG/ACT nasal spray 1 spray in each nostril twice a day as needed. Use the "crossover" technique as discussed 16 g 2  . Glucosamine 750 MG TABS Take 1 tablet by mouth 2 (two) times daily.    Marland Kitchen losartan (COZAAR) 25 MG tablet  Take 1 tablet (25 mg total) by mouth daily. 90 tablet 3  . meloxicam (MOBIC) 7.5 MG tablet     . montelukast (SINGULAIR) 10 MG tablet TAKE 1 TABLET BY MOUTH DAILY AT BEDTIME 90 tablet 3  . pantoprazole (PROTONIX) 40 MG tablet Take 1 tablet (40 mg total) by mouth daily. 90 tablet 1  . polyethylene glycol (MIRALAX / GLYCOLAX) packet Take 17 g by mouth 2 (two) times daily.     . pravastatin (PRAVACHOL) 20 MG tablet TAKE 1 TABLET AT BEDTIME (NEED PHYSICAL) 90 tablet 1  . SAW PALMETTO, SERENOA REPENS, PO Take 1 tablet by mouth every morning.     . tamsulosin (FLOMAX) 0.4 MG CAPS capsule TAKE 1 CAPSULE AT BEDTIME 90 capsule 3  . triamcinolone cream (KENALOG) 0.1 %      No current facility-administered medications on file prior to visit.     BP (!) 160/90 (BP Location: Left Arm, Patient Position: Sitting, Cuff Size: Normal)   Pulse 73   Temp 97.5 F (36.4 C) (Oral)   Ht 5' 8.5" (1.74 m)   Wt 209 lb (94.8 kg)   SpO2 98%   BMI 31.32 kg/m     Review of Systems  Constitutional: Negative for appetite change, chills, fatigue and fever.  HENT: Negative for congestion, dental problem, ear pain, hearing loss, sore throat, tinnitus, trouble swallowing and voice change.   Eyes: Negative for pain, discharge and visual disturbance.  Respiratory: Negative for cough, chest tightness, wheezing and stridor.   Cardiovascular: Negative for chest pain, palpitations and leg swelling.  Gastrointestinal: Negative for abdominal distention, abdominal pain, blood in stool, constipation, diarrhea, nausea and vomiting.  Genitourinary: Negative for difficulty urinating, discharge, flank pain, genital sores, hematuria and urgency.  Musculoskeletal: Positive for arthralgias, back pain and gait problem. Negative for joint swelling, myalgias and neck stiffness.  Skin: Negative for rash.  Neurological: Negative for dizziness, syncope, speech difficulty, weakness, numbness and headaches.  Hematological: Negative for  adenopathy. Does not bruise/bleed easily.  Psychiatric/Behavioral: Negative for behavioral problems and dysphoric mood. The patient is not nervous/anxious.        Objective:   Physical Exam  Constitutional: He is oriented to person, place, and time. He appears well-developed.  Repeat blood pressure 138/80  HENT:  Head: Normocephalic.  Right Ear: External ear normal.  Left Ear: External ear normal.  Eyes: Conjunctivae and EOM are normal.  Neck: Normal range of  motion.  Cardiovascular: Normal rate and normal heart sounds.   Pulmonary/Chest: Breath sounds normal.  Abdominal: Bowel sounds are normal.  Musculoskeletal: Normal range of motion. He exhibits no edema or tenderness.  Neurological: He is alert and oriented to person, place, and time.  Psychiatric: He has a normal mood and affect. His behavior is normal.          Assessment & Plan:   Hypertension.  Well-controlled.  No change in medical regimen Osteoarthritis, stable.  Dyslipidemia.  Continue statin therapy. BPH.  No change in medical regimen  CPX 6 months  KWIATKOWSKI,PETER Homero Fellers

## 2016-06-05 NOTE — Progress Notes (Signed)
Pre visit review using our clinic review tool, if applicable. No additional management support is needed unless otherwise documented below in the visit note. 

## 2016-06-14 ENCOUNTER — Telehealth: Payer: Self-pay | Admitting: Internal Medicine

## 2016-06-14 NOTE — Telephone Encounter (Signed)
Spoke to pt's wife Massie Maroon, told her the medication is a gel 1.1% due to paste is not available per [place]. Pt verbalized understanding.

## 2016-06-14 NOTE — Telephone Encounter (Signed)
[employer] has called the pt wanted to know what the dosage for the toothpaste will be?

## 2016-07-21 ENCOUNTER — Other Ambulatory Visit: Payer: Self-pay | Admitting: Internal Medicine

## 2016-08-21 DIAGNOSIS — Z85828 Personal history of other malignant neoplasm of skin: Secondary | ICD-10-CM | POA: Diagnosis not present

## 2016-08-21 DIAGNOSIS — L821 Other seborrheic keratosis: Secondary | ICD-10-CM | POA: Diagnosis not present

## 2016-08-21 DIAGNOSIS — L280 Lichen simplex chronicus: Secondary | ICD-10-CM | POA: Diagnosis not present

## 2016-08-21 DIAGNOSIS — L308 Other specified dermatitis: Secondary | ICD-10-CM | POA: Diagnosis not present

## 2016-09-27 ENCOUNTER — Other Ambulatory Visit: Payer: Self-pay | Admitting: Internal Medicine

## 2016-11-27 DIAGNOSIS — N401 Enlarged prostate with lower urinary tract symptoms: Secondary | ICD-10-CM | POA: Diagnosis not present

## 2016-11-27 DIAGNOSIS — R351 Nocturia: Secondary | ICD-10-CM | POA: Diagnosis not present

## 2016-12-01 ENCOUNTER — Other Ambulatory Visit: Payer: Self-pay | Admitting: Internal Medicine

## 2016-12-04 ENCOUNTER — Encounter: Payer: Self-pay | Admitting: Internal Medicine

## 2016-12-04 ENCOUNTER — Ambulatory Visit: Payer: Medicare Other | Admitting: Internal Medicine

## 2016-12-04 ENCOUNTER — Ambulatory Visit (INDEPENDENT_AMBULATORY_CARE_PROVIDER_SITE_OTHER): Payer: Medicare Other | Admitting: Internal Medicine

## 2016-12-04 VITALS — BP 128/82 | HR 74 | Temp 97.6°F | Ht 68.5 in | Wt 208.0 lb

## 2016-12-04 DIAGNOSIS — M159 Polyosteoarthritis, unspecified: Secondary | ICD-10-CM

## 2016-12-04 DIAGNOSIS — M150 Primary generalized (osteo)arthritis: Secondary | ICD-10-CM | POA: Diagnosis not present

## 2016-12-04 DIAGNOSIS — R7989 Other specified abnormal findings of blood chemistry: Secondary | ICD-10-CM

## 2016-12-04 DIAGNOSIS — I10 Essential (primary) hypertension: Secondary | ICD-10-CM

## 2016-12-04 DIAGNOSIS — Z86010 Personal history of colonic polyps: Secondary | ICD-10-CM | POA: Diagnosis not present

## 2016-12-04 DIAGNOSIS — M8949 Other hypertrophic osteoarthropathy, multiple sites: Secondary | ICD-10-CM

## 2016-12-04 DIAGNOSIS — E782 Mixed hyperlipidemia: Secondary | ICD-10-CM

## 2016-12-04 LAB — COMPREHENSIVE METABOLIC PANEL
ALT: 13 U/L (ref 0–53)
AST: 13 U/L (ref 0–37)
Albumin: 4.3 g/dL (ref 3.5–5.2)
Alkaline Phosphatase: 74 U/L (ref 39–117)
BUN: 23 mg/dL (ref 6–23)
CO2: 26 mEq/L (ref 19–32)
Calcium: 9 mg/dL (ref 8.4–10.5)
Chloride: 106 mEq/L (ref 96–112)
Creatinine, Ser: 1.21 mg/dL (ref 0.40–1.50)
GFR: 60.63 mL/min (ref >60.00)
Glucose, Bld: 110 mg/dL — ABNORMAL HIGH (ref 70–99)
Potassium: 4.5 mEq/L (ref 3.5–5.1)
Sodium: 139 mEq/L (ref 135–145)
Total Bilirubin: 0.7 mg/dL (ref 0.2–1.2)
Total Protein: 6.5 g/dL (ref 6.0–8.3)

## 2016-12-04 LAB — CBC WITH DIFFERENTIAL/PLATELET
Basophils Absolute: 0.1 10*3/uL (ref 0.0–0.1)
Basophils Relative: 1.1 % (ref 0.0–3.0)
Eosinophils Absolute: 0.1 10*3/uL (ref 0.0–0.7)
Eosinophils Relative: 2.1 % (ref 0.0–5.0)
HCT: 41.9 % (ref 39.0–52.0)
Hemoglobin: 14.3 g/dL (ref 13.0–17.0)
Lymphocytes Relative: 24.9 % (ref 12.0–46.0)
Lymphs Abs: 1.4 10*3/uL (ref 0.7–4.0)
MCHC: 34 g/dL (ref 30.0–36.0)
MCV: 91.5 fl (ref 78.0–100.0)
Monocytes Absolute: 0.4 10*3/uL (ref 0.1–1.0)
Monocytes Relative: 7 % (ref 3.0–12.0)
Neutro Abs: 3.5 10*3/uL (ref 1.4–7.7)
Neutrophils Relative %: 64.9 % (ref 43.0–77.0)
Platelets: 177 10*3/uL (ref 150.0–400.0)
RBC: 4.58 Mil/uL (ref 4.22–5.81)
RDW: 14.1 % (ref 11.5–15.5)
WBC: 5.4 10*3/uL (ref 4.0–10.5)

## 2016-12-04 LAB — LIPID PANEL
Cholesterol: 139 mg/dL (ref 0–200)
HDL: 59.4 mg/dL (ref >39.00)
LDL Cholesterol: 62 mg/dL (ref 0–99)
NonHDL: 79.29
Total CHOL/HDL Ratio: 2
Triglycerides: 85 mg/dL (ref 0.0–149.0)
VLDL: 17 mg/dL (ref 0.0–40.0)

## 2016-12-04 LAB — TSH: TSH: 2.98 u[IU]/mL (ref 0.35–4.50)

## 2016-12-04 NOTE — Patient Instructions (Signed)
Limit your sodium (Salt) intake    It is important that you exercise regularly, at least 20 minutes 3 to 4 times per week.  If you develop chest pain or shortness of breath seek  medical attention.  You need to lose weight.  Consider a lower calorie diet and regular exercise.  Return in 6 months for follow-up   

## 2016-12-04 NOTE — Progress Notes (Signed)
Subjective:    Patient ID: Joshua Lopez, male    DOB: [birth year], 83 y.o.   MRN: [id number]  HPI  83 year old patient who is seen today for follow-up and subsequent Medicare wellness visit .  He has a history of BPH and elevated PSA and is followed by urology. He has essential hypertension and osteoarthritis. He has a history of colonic polyps. Additionally, he has a history of dyslipidemia and allergic rhinitis. He has had a recent urological follow-up  Last colonoscopy 2010  Past Medical History:  Diagnosis Date  . Arthritis    diffuse  . BPH (benign prostatic hypertrophy)    Dr. Isabel Caprice monitors  elevated PSA  . Chronic lower back pain 05-10-12   States pain is related to hip  . Colon polyp    2006 and 2010  . Esophageal stricture   . GERD (gastroesophageal reflux disease)   . Hyperlipidemia    NMR  2007  . Hypertension   . Subclavian steal syndrome 1997     Social History   Social History  . Marital status: Married    Spouse name: N/A  . Number of children: N/A  . Years of education: N/A   Occupational History  . Not on file.   Social History Main Topics  . Smoking status: Former Smoker    Packs/day: 0.50    Years: 33.00    Types: Cigarettes    Quit date: 07/24/1973  . Smokeless tobacco: Former Neurosurgeon    Types: Chew    Quit date: 07/24/1974     Comment: smoked 1949- 1975 , up to 1/3 ppd  . Alcohol use No  . Drug use: No  . Sexual activity: Yes   Other Topics Concern  . Not on file   Social History Narrative  . No narrative on file    Past Surgical History:  Procedure Laterality Date  . BACK SURGERY  05-10-12   lumbar fusion with hardware  . CATARACT EXTRACTION W/ INTRAOCULAR LENS  IMPLANT, BILATERAL    . COLONOSCOPY W/ POLYPECTOMY  2006 & 2010  . ESOPHAGEAL DILATION  2003  . GANGLION CYST EXCISION  1999   LUE  . GANGLION CYST EXCISION  05/14/2012   Procedure: REMOVAL GANGLION OF WRIST;  Surgeon: Kathryne Hitch, MD;  Location: [facility];   Service: Orthopedics;  Laterality: Right;  Excision Right Wrist Ganglion Cyst  . JOINT REPLACEMENT    . KNEE ARTHROSCOPY  2001   left  . POSTERIOR FUSION LUMBAR SPINE  02/08/12   L2-L4  . PROSTATE BIOPSY  2000   Dr. Earlene Plater  . SUBCLAVIAN STENT PLACEMENT  1997   right side  . TOTAL HIP ARTHROPLASTY  05/14/2012   Procedure: TOTAL HIP ARTHROPLASTY ANTERIOR APPROACH;  Surgeon: Kathryne Hitch, MD;  Location: [facility];  Service: Orthopedics;  Laterality: Right;  Right Total Hip Arthroplasty, Anterior Approach (C-Arm)  . TOTAL SHOULDER ARTHROPLASTY  2010   Dr. Dion Saucier; right    Family History  Problem Relation Age of Onset  . Sudden death Father        murdered  . Hypertension Mother   . Skin cancer Mother   . Skin cancer Brother   . Hypertension Brother   . Coronary artery disease Brother        CABG  . Colon cancer Paternal Uncle   . Breast cancer Paternal Grandmother   . Breast cancer Paternal Aunt   . Colon cancer Paternal Aunt   . Heart attack  Paternal Uncle   . Kidney disease Neg Hx   . Diabetes Neg Hx     Allergies  Allergen Reactions  . Aspirin Rash and Other (See Comments)    Dyspnea, eyelid edema, truncal urticaria--Patient tolerates other NSAIDS at home Because of a history of documented adverse serious drug reaction;Medi Alert bracelet  is recommended    Current Outpatient Prescriptions on File Prior to Visit  Medication Sig Dispense Refill  . acetaminophen (TYLENOL) 500 MG tablet Take 500 mg by mouth every 6 (six) hours as needed. For pain    . amLODipine (NORVASC) 5 MG tablet TAKE 1 TABLET EVERY DAY 90 tablet 1  . Calcium Carbonate-Vitamin D (CALTRATE 600+D PO) Take 1 tablet by mouth 2 (two) times daily.     . fluticasone (FLONASE) 50 MCG/ACT nasal spray 1 spray in each nostril twice a day as needed. Use the "crossover" technique as discussed 16 g 2  . Glucosamine 750 MG TABS Take 1 tablet by mouth 2 (two) times daily.    Marland Kitchen losartan (COZAAR) 25 MG tablet  Take 1 tablet (25 mg total) by mouth daily. 90 tablet 3  . meloxicam (MOBIC) 7.5 MG tablet     . montelukast (SINGULAIR) 10 MG tablet TAKE 1 TABLET AT BEDTIME 90 tablet 3  . pantoprazole (PROTONIX) 40 MG tablet TAKE 1 TABLET EVERY DAY 30 MINUTES BEFORE A MEAL (NEED A PHYSICAL) 90 tablet 1  . polyethylene glycol (MIRALAX / GLYCOLAX) packet Take 17 g by mouth 2 (two) times daily.     . pravastatin (PRAVACHOL) 20 MG tablet TAKE 1 TABLET AT BEDTIME (NEED PHYSICAL) 90 tablet 1  . SAW PALMETTO, SERENOA REPENS, PO Take 1 tablet by mouth every morning.     . Sodium Fluoride (FLUORIDEX ENHANCED WHITENING) 1.1 % PSTE Place 1 application onto teeth daily at 8 pm. 4 Bottle 6  . tamsulosin (FLOMAX) 0.4 MG CAPS capsule TAKE 1 CAPSULE AT BEDTIME 90 capsule 3  . triamcinolone cream (KENALOG) 0.1 %      No current facility-administered medications on file prior to visit.     BP 128/82 (BP Location: Left Arm, Patient Position: Sitting, Cuff Size: Normal)   Pulse 74   Temp 97.6 F (36.4 C)   Ht 5' 8.5" (1.74 m)   Wt 208 lb (94.3 kg)   SpO2 96%   BMI 31.17 kg/m    Medicare wellness visit:   1. Risk factors, based on past  M,S,F history.   cardiovascular risk factors include hypertension, dyslipidemia and peripheral vascular disease; he has a history of subclavian artery stenosis status post stenting  2.  Physical activities: limited due to arthritis.  Status post hip replacement surgery no major limitations.  Enjoys travel  3.  Depression/mood:no history depression and mood disorder  4.  Hearing:mild deficit only  5.  ADL's:independent  6.  Fall risk:low  7.  Home safety:no problems identified  8.  Height weight, and visual acuity;height and weight stable no change in visual acuity.  Followed closely by ophthalmology due to a small left retinal hemorrhage  9.  Counseling:heart healthy diet recommended.  Modest weight loss encouraged  10. Lab orders based on risk factors:laboratory  profile, including lipid panel will be reviewed  11. Referral :follow-up urology and ophthalmology  12. Care plan:continue heart healthy diet and efforts at weight loss  13. Cognitive assessment: alert and oriented with normal affect.  No cognitive dysfunction  14. Screening:we'll continue annual health examinations.  Will continue annual health  examinations with screening lab.  Will continue to be seen by urology annually.  We'll consider for carotid artery Doppler evaluation every 2 years   15. Provider List Update: ophthalmology, urology primary care and radiology, as well as orthopedics    Review of Systems  Constitutional: Negative for appetite change, chills, fatigue and fever.  HENT: Negative for congestion, dental problem, ear pain, hearing loss, sore throat, tinnitus, trouble swallowing and voice change.   Eyes: Negative for pain, discharge and visual disturbance.  Respiratory: Negative for cough, chest tightness, wheezing and stridor.   Cardiovascular: Negative for chest pain, palpitations and leg swelling.  Gastrointestinal: Negative for abdominal distention, abdominal pain, blood in stool, constipation, diarrhea, nausea and vomiting.  Genitourinary: Negative for difficulty urinating, discharge, flank pain, genital sores, hematuria and urgency.  Musculoskeletal: Positive for arthralgias and back pain. Negative for gait problem, joint swelling, myalgias and neck stiffness.  Skin: Negative for rash.  Neurological: Negative for dizziness, syncope, speech difficulty, weakness, numbness and headaches.  Hematological: Negative for adenopathy. Does not bruise/bleed easily.  Psychiatric/Behavioral: Negative for behavioral problems and dysphoric mood. The patient is not nervous/anxious.        Objective:   Physical Exam  Constitutional: He is oriented to person, place, and time. He appears well-developed and well-nourished.  Blood pressures symmetrical  HENT:  Head:  Normocephalic and atraumatic.  Right Ear: External ear normal.  Left Ear: External ear normal.  Nose: Nose normal.  Mouth/Throat: Oropharynx is clear and moist.  Wax both canals  Eyes: Conjunctivae and EOM are normal. Pupils are equal, round, and reactive to light. No scleral icterus.  Neck: Normal range of motion. Neck supple. No JVD present. No thyromegaly present.  Scar right anterior lower neck  Cardiovascular: Regular rhythm, normal heart sounds and intact distal pulses.  Exam reveals no gallop and no friction rub.   No murmur heard. Pulmonary/Chest: Effort normal and breath sounds normal. He exhibits no tenderness.  Abdominal: Soft. Bowel sounds are normal. He exhibits no distension and no mass. There is no tenderness.  Genitourinary: Penis normal.  Musculoskeletal: Normal range of motion. He exhibits no edema or tenderness.  Lymphadenopathy:    He has no cervical adenopathy.  Neurological: He is alert and oriented to person, place, and time. He has normal reflexes. No cranial nerve deficit. Coordination normal.  Skin: Skin is warm and dry. No rash noted.  Psychiatric: He has a normal mood and affect. His behavior is normal.          Assessment & Plan:   Essential hypertension, controlled Dyslipidemia.  Continue statin therapy BPH.  Follow-up urology Subsequent Medicare wellness visit  Check screening lab Follow-up 6 months\\Arien Benincasa [facility]

## 2016-12-11 ENCOUNTER — Other Ambulatory Visit: Payer: Self-pay | Admitting: Internal Medicine

## 2016-12-28 DIAGNOSIS — Z961 Presence of intraocular lens: Secondary | ICD-10-CM | POA: Diagnosis not present

## 2017-01-22 ENCOUNTER — Other Ambulatory Visit: Payer: Self-pay | Admitting: Internal Medicine

## 2017-02-12 ENCOUNTER — Other Ambulatory Visit: Payer: Self-pay

## 2017-02-12 DIAGNOSIS — L308 Other specified dermatitis: Secondary | ICD-10-CM | POA: Diagnosis not present

## 2017-02-12 DIAGNOSIS — Z85828 Personal history of other malignant neoplasm of skin: Secondary | ICD-10-CM | POA: Diagnosis not present

## 2017-04-06 DIAGNOSIS — L820 Inflamed seborrheic keratosis: Secondary | ICD-10-CM | POA: Diagnosis not present

## 2017-04-06 DIAGNOSIS — L570 Actinic keratosis: Secondary | ICD-10-CM | POA: Diagnosis not present

## 2017-04-06 DIAGNOSIS — L821 Other seborrheic keratosis: Secondary | ICD-10-CM | POA: Diagnosis not present

## 2017-04-06 DIAGNOSIS — D170 Benign lipomatous neoplasm of skin and subcutaneous tissue of head, face and neck: Secondary | ICD-10-CM | POA: Diagnosis not present

## 2017-04-06 DIAGNOSIS — Z85828 Personal history of other malignant neoplasm of skin: Secondary | ICD-10-CM | POA: Diagnosis not present

## 2017-04-06 DIAGNOSIS — D1801 Hemangioma of skin and subcutaneous tissue: Secondary | ICD-10-CM | POA: Diagnosis not present

## 2017-04-12 ENCOUNTER — Encounter: Payer: Self-pay | Admitting: Internal Medicine

## 2017-04-18 ENCOUNTER — Other Ambulatory Visit: Payer: Self-pay | Admitting: Internal Medicine

## 2017-04-23 ENCOUNTER — Other Ambulatory Visit: Payer: Self-pay | Admitting: Internal Medicine

## 2017-04-23 MED ORDER — MONTELUKAST SODIUM 10 MG PO TABS: 10.0000 mg | ORAL_TABLET | Freq: Every day | ORAL | 3 refills | Status: DC

## 2017-05-30 DIAGNOSIS — Z23 Encounter for immunization: Secondary | ICD-10-CM | POA: Diagnosis not present

## 2017-09-10 ENCOUNTER — Other Ambulatory Visit: Payer: Self-pay | Admitting: Internal Medicine

## 2017-10-01 ENCOUNTER — Telehealth (INDEPENDENT_AMBULATORY_CARE_PROVIDER_SITE_OTHER): Payer: Self-pay | Admitting: Orthopaedic Surgery

## 2017-10-01 NOTE — Telephone Encounter (Signed)
Patient aware he does not need anything

## 2017-10-01 NOTE — Telephone Encounter (Signed)
Patients wife called asking if he still needs to be taking his medication/antibiotic before going to the dentist since he has had a couple of surgeries. The dentist office advised them to call here to see if he needs it and get it refilled. Please advise # [phone]

## 2017-11-02 DIAGNOSIS — S92325D Nondisplaced fracture of second metatarsal bone, left foot, subsequent encounter for fracture with routine healing: Secondary | ICD-10-CM | POA: Diagnosis not present

## 2017-11-12 ENCOUNTER — Encounter: Payer: Self-pay | Admitting: Internal Medicine

## 2017-11-12 ENCOUNTER — Ambulatory Visit (INDEPENDENT_AMBULATORY_CARE_PROVIDER_SITE_OTHER): Payer: Medicare Other | Admitting: Internal Medicine

## 2017-11-12 VITALS — BP 130/66 | HR 73 | Temp 98.1°F | Wt 201.4 lb

## 2017-11-12 DIAGNOSIS — J301 Allergic rhinitis due to pollen: Secondary | ICD-10-CM | POA: Diagnosis not present

## 2017-11-12 DIAGNOSIS — R634 Abnormal weight loss: Secondary | ICD-10-CM

## 2017-11-12 DIAGNOSIS — M8949 Other hypertrophic osteoarthropathy, multiple sites: Secondary | ICD-10-CM

## 2017-11-12 DIAGNOSIS — M150 Primary generalized (osteo)arthritis: Secondary | ICD-10-CM | POA: Diagnosis not present

## 2017-11-12 DIAGNOSIS — E538 Deficiency of other specified B group vitamins: Secondary | ICD-10-CM

## 2017-11-12 DIAGNOSIS — I10 Essential (primary) hypertension: Secondary | ICD-10-CM | POA: Diagnosis not present

## 2017-11-12 DIAGNOSIS — R410 Disorientation, unspecified: Secondary | ICD-10-CM | POA: Diagnosis not present

## 2017-11-12 DIAGNOSIS — M159 Polyosteoarthritis, unspecified: Secondary | ICD-10-CM

## 2017-11-12 LAB — CBC WITH DIFFERENTIAL/PLATELET
Basophils Absolute: 0.1 10*3/uL (ref 0.0–0.1)
Basophils Relative: 1 % (ref 0.0–3.0)
Eosinophils Absolute: 0.1 10*3/uL (ref 0.0–0.7)
Eosinophils Relative: 1.5 % (ref 0.0–5.0)
HCT: 42.3 % (ref 39.0–52.0)
Hemoglobin: 14.6 g/dL (ref 13.0–17.0)
Lymphocytes Relative: 19.9 % (ref 12.0–46.0)
Lymphs Abs: 1.3 10*3/uL (ref 0.7–4.0)
MCHC: 34.6 g/dL (ref 30.0–36.0)
MCV: 92.3 fl (ref 78.0–100.0)
Monocytes Absolute: 0.4 10*3/uL (ref 0.1–1.0)
Monocytes Relative: 6.5 % (ref 3.0–12.0)
Neutro Abs: 4.8 10*3/uL (ref 1.4–7.7)
Neutrophils Relative %: 71.1 % (ref 43.0–77.0)
Platelets: 201 10*3/uL (ref 150.0–400.0)
RBC: 4.58 Mil/uL (ref 4.22–5.81)
RDW: 13.9 % (ref 11.5–15.5)
WBC: 6.7 10*3/uL (ref 4.0–10.5)

## 2017-11-12 LAB — COMPREHENSIVE METABOLIC PANEL
ALT: 14 U/L (ref 0–53)
AST: 12 U/L (ref 0–37)
Albumin: 4.5 g/dL (ref 3.5–5.2)
Alkaline Phosphatase: 68 U/L (ref 39–117)
BUN: 21 mg/dL (ref 6–23)
CO2: 27 mEq/L (ref 19–32)
Calcium: 9.1 mg/dL (ref 8.4–10.5)
Chloride: 102 mEq/L (ref 96–112)
Creatinine, Ser: 1.24 mg/dL (ref 0.40–1.50)
GFR: 58.81 mL/min — ABNORMAL LOW (ref >60.00)
Glucose, Bld: 90 mg/dL (ref 70–99)
Potassium: 4.4 mEq/L (ref 3.5–5.1)
Sodium: 137 mEq/L (ref 135–145)
Total Bilirubin: 0.8 mg/dL (ref 0.2–1.2)
Total Protein: 6.7 g/dL (ref 6.0–8.3)

## 2017-11-12 LAB — VITAMIN B12: Vitamin B-12: 378 pg/mL (ref 211–911)

## 2017-11-12 LAB — SEDIMENTATION RATE: Sed Rate: 7 mm/hr (ref 0–20)

## 2017-11-12 LAB — TSH: TSH: 1.63 u[IU]/mL (ref 0.35–4.50)

## 2017-11-12 MED ORDER — DONEPEZIL HCL 5 MG PO TBDP: 5.0000 mg | ORAL_TABLET | Freq: Every day | ORAL | 1 refills | Status: DC

## 2017-11-12 MED ORDER — ESCITALOPRAM OXALATE 5 MG PO TABS: 5.0000 mg | ORAL_TABLET | Freq: Every day | ORAL | 2 refills | Status: DC

## 2017-11-12 MED ORDER — ESCITALOPRAM OXALATE 5 MG PO TABS
5.0000 mg | ORAL_TABLET | Freq: Every day | ORAL | 2 refills | Status: DC
Start: 2017-11-12 — End: 2017-11-12

## 2017-11-12 NOTE — Patient Instructions (Signed)
Take new medications as prescribed  Return in 4 weeks for follow-up  Report any new or worsening symptoms

## 2017-11-12 NOTE — Progress Notes (Signed)
Subjective:    Patient ID: Joshua Lopez, male    DOB: 1930/02/16, 84 y.o.   MRN: [id number]  HPI 84 year old patient who is seen today accompanied by his wife.  Complaints today include insomnia anorexia and dizziness. The past 4 months he has had a decrease in his appetite.  He has wife describe excessive worry and difficulty with sleep. Over the past year the patient has become much more forgetful and confused.  His wife states that he often forgets names and has had a difficult time focusing on details.  This has worsened over the past 4 months  MMSE performed today with a score of 16 out of 30.  The patient was not able to name the year or the date.  He was able to spell world forward but not backward.  He was unable to call any of 3 objects.  Past Medical History:  Diagnosis Date  . Arthritis    diffuse  . BPH (benign prostatic hypertrophy)    Dr. Isabel Caprice monitors  elevated PSA  . Chronic lower back pain 05-10-12   States pain is related to hip  . Colon polyp    2006 and 2010  . Esophageal stricture   . GERD (gastroesophageal reflux disease)   . Hyperlipidemia    NMR  2007  . Hypertension   . Subclavian steal syndrome 1997     Social History   Socioeconomic History  . Marital status: Married    Spouse name: Not on file  . Number of children: Not on file  . Years of education: Not on file  . Highest education level: Not on file  Occupational History  . Not on file  Social Needs  . Financial resource strain: Not on file  . Food insecurity:    Worry: Not on file    Inability: Not on file  . Transportation needs:    Medical: Not on file    Non-medical: Not on file  Tobacco Use  . Smoking status: Former Smoker    Packs/day: 0.50    Years: 33.00    Pack years: 16.50    Types: Cigarettes    Last attempt to quit: 07/24/1973    Years since quitting: 44.3  . Smokeless tobacco: Former Neurosurgeon    Types: Chew    Quit date: 07/24/1974  . Tobacco comment: smoked 1949- 1975  , up to 1/3 ppd  Substance and Sexual Activity  . Alcohol use: No  . Drug use: No  . Sexual activity: Yes  Lifestyle  . Physical activity:    Days per week: Not on file    Minutes per session: Not on file  . Stress: Not on file  Relationships  . Social connections:    Talks on phone: Not on file    Gets together: Not on file    Attends religious service: Not on file    Active member of club or organization: Not on file    Attends meetings of clubs or organizations: Not on file    Relationship status: Not on file  . Intimate partner violence:    Fear of current or ex partner: Not on file    Emotionally abused: Not on file    Physically abused: Not on file    Forced sexual activity: Not on file  Other Topics Concern  . Not on file  Social History Narrative  . Not on file    Past Surgical History:  Procedure Laterality Date  . BACK  SURGERY  05-10-12   lumbar fusion with hardware  . CATARACT EXTRACTION W/ INTRAOCULAR LENS  IMPLANT, BILATERAL    . COLONOSCOPY W/ POLYPECTOMY  2006 & 2010  . ESOPHAGEAL DILATION  2003  . GANGLION CYST EXCISION  1999   LUE  . GANGLION CYST EXCISION  05/14/2012   Procedure: REMOVAL GANGLION OF WRIST;  Surgeon: Kathryne Hitch, MD;  Location: [facility];  Service: Orthopedics;  Laterality: Right;  Excision Right Wrist Ganglion Cyst  . JOINT REPLACEMENT    . KNEE ARTHROSCOPY  2001   left  . POSTERIOR FUSION LUMBAR SPINE  02/08/12   L2-L4  . PROSTATE BIOPSY  2000   Dr. Earlene Plater  . SUBCLAVIAN STENT PLACEMENT  1997   right side  . TOTAL HIP ARTHROPLASTY  05/14/2012   Procedure: TOTAL HIP ARTHROPLASTY ANTERIOR APPROACH;  Surgeon: Kathryne Hitch, MD;  Location: [facility];  Service: Orthopedics;  Laterality: Right;  Right Total Hip Arthroplasty, Anterior Approach (C-Arm)  . TOTAL SHOULDER ARTHROPLASTY  2010   Dr. Dion Saucier; right    Family History  Problem Relation Age of Onset  . Sudden death Father        murdered  . Hypertension Mother     . Skin cancer Mother   . Skin cancer Brother   . Hypertension Brother   . Coronary artery disease Brother        CABG  . Colon cancer Paternal Uncle   . Breast cancer Paternal Grandmother   . Breast cancer Paternal Aunt   . Colon cancer Paternal Aunt   . Heart attack Paternal Uncle   . Kidney disease Neg Hx   . Diabetes Neg Hx     Allergies  Allergen Reactions  . Aspirin Rash and Other (See Comments)    Dyspnea, eyelid edema, truncal urticaria--Patient tolerates other NSAIDS at home Because of a history of documented adverse serious drug reaction;Medi Alert bracelet  is recommended    Current Outpatient Medications on File Prior to Visit  Medication Sig Dispense Refill  . acetaminophen (TYLENOL) 500 MG tablet Take 500 mg by mouth every 6 (six) hours as needed. For pain    . amLODipine (NORVASC) 5 MG tablet TAKE 1 TABLET EVERY DAY 90 tablet 1  . Calcium Carbonate-Vitamin D (CALTRATE 600+D PO) Take 1 tablet by mouth 2 (two) times daily.     . fluticasone (FLONASE) 50 MCG/ACT nasal spray 1 spray in each nostril twice a day as needed. Use the "crossover" technique as discussed 16 g 2  . Glucosamine 750 MG TABS Take 1 tablet by mouth 2 (two) times daily.    Marland Kitchen losartan (COZAAR) 25 MG tablet TAKE 1 TABLET (25 MG TOTAL) BY MOUTH DAILY. 90 tablet 3  . meloxicam (MOBIC) 7.5 MG tablet     . montelukast (SINGULAIR) 10 MG tablet Take 1 tablet (10 mg total) by mouth at bedtime. 90 tablet 3  . pantoprazole (PROTONIX) 40 MG tablet TAKE 1 TABLET EVERY DAY 30 MINUTES BEFORE A MEAL  90 tablet 1  . polyethylene glycol (MIRALAX / GLYCOLAX) packet Take 17 g by mouth 2 (two) times daily.     . pravastatin (PRAVACHOL) 20 MG tablet TAKE 1 TABLET AT BEDTIME 90 tablet 1  . SAW PALMETTO, SERENOA REPENS, PO Take 1 tablet by mouth every morning.     . Sodium Fluoride (FLUORIDEX ENHANCED WHITENING) 1.1 % PSTE Place 1 application onto teeth daily at 8 pm. 4 Bottle 6  . tamsulosin (FLOMAX) 0.4 MG  CAPS capsule  TAKE 1 CAPSULE AT BEDTIME 90 capsule 3  . triamcinolone cream (KENALOG) 0.1 %      No current facility-administered medications on file prior to visit.     BP 130/66 (BP Location: Left Arm, Patient Position: Sitting, Cuff Size: Large)   Pulse 73   Temp 98.1 F (36.7 C) (Oral)   Wt 201 lb 6.4 oz (91.4 kg)   SpO2 98%   BMI 30.18 kg/m      Review of Systems  Constitutional: Positive for activity change, appetite change and fatigue. Negative for chills and fever.  HENT: Negative for congestion, dental problem, ear pain, hearing loss, sore throat, tinnitus, trouble swallowing and voice change.   Eyes: Negative for pain, discharge and visual disturbance.  Respiratory: Negative for cough, chest tightness, wheezing and stridor.   Cardiovascular: Negative for chest pain, palpitations and leg swelling.  Gastrointestinal: Negative for abdominal distention, abdominal pain, blood in stool, constipation, diarrhea, nausea and vomiting.  Genitourinary: Negative for difficulty urinating, discharge, flank pain, genital sores, hematuria and urgency.  Musculoskeletal: Negative for arthralgias, back pain, gait problem, joint swelling, myalgias and neck stiffness.  Skin: Negative for rash.  Neurological: Positive for light-headedness. Negative for dizziness, syncope, speech difficulty, weakness, numbness and headaches.  Hematological: Negative for adenopathy. Does not bruise/bleed easily.  Psychiatric/Behavioral: Positive for behavioral problems, confusion, decreased concentration and sleep disturbance. Negative for dysphoric mood. The patient is nervous/anxious.        Objective:   Physical Exam  Constitutional: He is oriented to person, place, and time. He appears well-developed.  Weight 201  HENT:  Head: Normocephalic.  Right Ear: External ear normal.  Left Ear: External ear normal.  Eyes: Conjunctivae and EOM are normal.  Neck: Normal range of motion.  Cardiovascular: Normal rate and normal  heart sounds.  Pulmonary/Chest: Breath sounds normal.  Abdominal: Bowel sounds are normal.  Musculoskeletal: Normal range of motion. He exhibits no edema or tenderness.  Neurological: He is alert and oriented to person, place, and time.  Normal finger-to-nose testing  MMSE 16 out of 30  Psychiatric: He has a normal mood and affect. His behavior is normal.          Assessment & Plan:   Moderate cognitive dysfunction.  Will check screening labs including B12 level and TSH.  Trial of Aricept 5 mg at bedtime.  Follow-up 1 month.  Consider up titration at that time Anorexia Insomnia Essential hypertension stable  Check updated lab Trial Lexapro 5 mg daily Follow-up 4 weeks  Joshua Lopez

## 2017-11-14 LAB — RPR: RPR Ser Ql: NONREACTIVE

## 2017-11-15 ENCOUNTER — Other Ambulatory Visit: Payer: Self-pay | Admitting: Internal Medicine

## 2017-11-25 DIAGNOSIS — E782 Mixed hyperlipidemia: Secondary | ICD-10-CM | POA: Diagnosis not present

## 2017-11-25 DIAGNOSIS — R0989 Other specified symptoms and signs involving the circulatory and respiratory systems: Secondary | ICD-10-CM | POA: Diagnosis not present

## 2017-11-25 DIAGNOSIS — I10 Essential (primary) hypertension: Secondary | ICD-10-CM | POA: Diagnosis not present

## 2017-11-27 ENCOUNTER — Encounter: Payer: Self-pay | Admitting: Internal Medicine

## 2017-11-27 ENCOUNTER — Ambulatory Visit (INDEPENDENT_AMBULATORY_CARE_PROVIDER_SITE_OTHER): Payer: Medicare Other | Admitting: Internal Medicine

## 2017-11-27 VITALS — BP 110/52 | HR 68 | Temp 97.6°F | Wt 196.0 lb

## 2017-11-27 DIAGNOSIS — R4189 Other symptoms and signs involving cognitive functions and awareness: Secondary | ICD-10-CM | POA: Diagnosis not present

## 2017-11-27 DIAGNOSIS — I10 Essential (primary) hypertension: Secondary | ICD-10-CM

## 2017-11-27 DIAGNOSIS — E782 Mixed hyperlipidemia: Secondary | ICD-10-CM

## 2017-11-27 DIAGNOSIS — F0150 Vascular dementia without behavioral disturbance: Secondary | ICD-10-CM | POA: Diagnosis not present

## 2017-11-27 MED ORDER — DONEPEZIL HCL 10 MG PO TBDP: 10.0000 mg | ORAL_TABLET | Freq: Every day | ORAL | 2 refills | Status: DC

## 2017-11-27 NOTE — Patient Instructions (Addendum)
Discontinue amlodipine  Increase Aricept to 10 mg daily    Return in 3 months for follow-up  Head CT scan as discussed

## 2017-11-27 NOTE — Progress Notes (Signed)
Subjective:    Patient ID: Joshua Lopez, male    DOB: 1929-10-27, 84 y.o.   MRN: [id number]  HPI  84 year old patient who was seen 2 weeks ago.  Complaints at that time included insomnia anorexia and dizziness.  Additional complaints included excess worry and forgetfulness.  MMSE was performed with a score of 16 out of 30.  He was placed on Aricept He was seen at the urgent care 3 days ago and felt quite weak and sick.  He was seen by a provider who suggested he may have had a stroke and this concern prompted the visit today he complains of poor sleep and nervousness.  He has tolerated the Aricept well without nausea or other GI complaints.  Past Medical History:  Diagnosis Date  . Arthritis    diffuse  . BPH (benign prostatic hypertrophy)    Dr. Isabel Caprice monitors  elevated PSA  . Chronic lower back pain 05-10-12   States pain is related to hip  . Colon polyp    2006 and 2010  . Esophageal stricture   . GERD (gastroesophageal reflux disease)   . Hyperlipidemia    NMR  2007  . Hypertension   . Subclavian steal syndrome 1997     Social History   Socioeconomic History  . Marital status: Married    Spouse name: Not on file  . Number of children: Not on file  . Years of education: Not on file  . Highest education level: Not on file  Occupational History  . Not on file  Social Needs  . Financial resource strain: Not on file  . Food insecurity:    Worry: Not on file    Inability: Not on file  . Transportation needs:    Medical: Not on file    Non-medical: Not on file  Tobacco Use  . Smoking status: Former Smoker    Packs/day: 0.50    Years: 33.00    Pack years: 16.50    Types: Cigarettes    Last attempt to quit: 07/24/1973    Years since quitting: 44.3  . Smokeless tobacco: Former Neurosurgeon    Types: Chew    Quit date: 07/24/1974  . Tobacco comment: smoked 1949- 1975 , up to 1/3 ppd  Substance and Sexual Activity  . Alcohol use: No  . Drug use: No  . Sexual activity: Yes   Lifestyle  . Physical activity:    Days per week: Not on file    Minutes per session: Not on file  . Stress: Not on file  Relationships  . Social connections:    Talks on phone: Not on file    Gets together: Not on file    Attends religious service: Not on file    Active member of club or organization: Not on file    Attends meetings of clubs or organizations: Not on file    Relationship status: Not on file  . Intimate partner violence:    Fear of current or ex partner: Not on file    Emotionally abused: Not on file    Physically abused: Not on file    Forced sexual activity: Not on file  Other Topics Concern  . Not on file  Social History Narrative  . Not on file    Past Surgical History:  Procedure Laterality Date  . BACK SURGERY  05-10-12   lumbar fusion with hardware  . CATARACT EXTRACTION W/ INTRAOCULAR LENS  IMPLANT, BILATERAL    . COLONOSCOPY  W/ POLYPECTOMY  2006 & 2010  . ESOPHAGEAL DILATION  2003  . GANGLION CYST EXCISION  1999   LUE  . GANGLION CYST EXCISION  05/14/2012   Procedure: REMOVAL GANGLION OF WRIST;  Surgeon: Kathryne Hitch, MD;  Location: [facility];  Service: Orthopedics;  Laterality: Right;  Excision Right Wrist Ganglion Cyst  . JOINT REPLACEMENT    . KNEE ARTHROSCOPY  2001   left  . POSTERIOR FUSION LUMBAR SPINE  02/08/12   L2-L4  . PROSTATE BIOPSY  2000   Dr. Earlene Plater  . SUBCLAVIAN STENT PLACEMENT  1997   right side  . TOTAL HIP ARTHROPLASTY  05/14/2012   Procedure: TOTAL HIP ARTHROPLASTY ANTERIOR APPROACH;  Surgeon: Kathryne Hitch, MD;  Location: [facility];  Service: Orthopedics;  Laterality: Right;  Right Total Hip Arthroplasty, Anterior Approach (C-Arm)  . TOTAL SHOULDER ARTHROPLASTY  2010   Dr. Dion Saucier; right    Family History  Problem Relation Age of Onset  . Sudden death Father        murdered  . Hypertension Mother   . Skin cancer Mother   . Skin cancer Brother   . Hypertension Brother   . Coronary artery disease Brother         CABG  . Colon cancer Paternal Uncle   . Breast cancer Paternal Grandmother   . Breast cancer Paternal Aunt   . Colon cancer Paternal Aunt   . Heart attack Paternal Uncle   . Kidney disease Neg Hx   . Diabetes Neg Hx     Allergies  Allergen Reactions  . Aspirin Rash and Other (See Comments)    Dyspnea, eyelid edema, truncal urticaria--Patient tolerates other NSAIDS at home Because of a history of documented adverse serious drug reaction;Medi Alert bracelet  is recommended    Current Outpatient Medications on File Prior to Visit  Medication Sig Dispense Refill  . acetaminophen (TYLENOL) 500 MG tablet Take 500 mg by mouth every 6 (six) hours as needed. For pain    . Calcium Carbonate-Vitamin D (CALTRATE 600+D PO) Take 1 tablet by mouth 2 (two) times daily.     Marland Kitchen escitalopram (LEXAPRO) 5 MG tablet Take 1 tablet (5 mg total) by mouth daily. 60 tablet 2  . fluocinonide cream (LIDEX) 0.05 % fluocinonide 0.05 % topical cream    . fluticasone (FLONASE) 50 MCG/ACT nasal spray 1 spray in each nostril twice a day as needed. Use the "crossover" technique as discussed 16 g 2  . Glucosamine 750 MG TABS Take 1 tablet by mouth 2 (two) times daily.    Marland Kitchen losartan (COZAAR) 25 MG tablet TAKE 1 TABLET (25 MG TOTAL) BY MOUTH DAILY. 90 tablet 3  . meloxicam (MOBIC) 7.5 MG tablet     . montelukast (SINGULAIR) 10 MG tablet Take 1 tablet (10 mg total) by mouth at bedtime. 90 tablet 3  . pantoprazole (PROTONIX) 40 MG tablet TAKE 1 TABLET EVERY DAY 30 MINUTES BEFORE A MEAL  90 tablet 1  . polyethylene glycol (MIRALAX / GLYCOLAX) packet Take 17 g by mouth 2 (two) times daily.     . pravastatin (PRAVACHOL) 20 MG tablet TAKE 1 TABLET AT BEDTIME 90 tablet 1  . SAW PALMETTO, SERENOA REPENS, PO Take 1 tablet by mouth every morning.     . Sodium Fluoride (FLUORIDEX ENHANCED WHITENING) 1.1 % PSTE Place 1 application onto teeth daily at 8 pm. 4 Bottle 6  . tamsulosin (FLOMAX) 0.4 MG CAPS capsule TAKE 1 CAPSULE AT  BEDTIME 90 capsule 3  . triamcinolone cream (KENALOG) 0.1 %      No current facility-administered medications on file prior to visit.     BP (!) 110/52 (BP Location: Right Arm, Patient Position: Sitting, Cuff Size: Large)   Pulse 68   Temp 97.6 F (36.4 C) (Oral)   Wt 196 lb (88.9 kg)   SpO2 99%   BMI 29.37 kg/m     Review of Systems  Constitutional: Positive for activity change, appetite change and fatigue.  Psychiatric/Behavioral: Positive for confusion, decreased concentration and sleep disturbance. The patient is nervous/anxious.        Objective:   Physical Exam  Constitutional: He is oriented to person, place, and time. He appears well-developed.  Blood pressure 100/70  Moderate cognitive impairment  HENT:  Head: Normocephalic.  Right Ear: External ear normal.  Left Ear: External ear normal.  Eyes: Conjunctivae and EOM are normal.  Neck: Normal range of motion.  Cardiovascular: Normal rate and normal heart sounds.  Pulmonary/Chest: Breath sounds normal.  Abdominal: Bowel sounds are normal.  Musculoskeletal: Normal range of motion. He exhibits no edema or tenderness.  Neurological: He is alert and oriented to person, place, and time.  Psychiatric: He has a normal mood and affect. His behavior is normal.          Assessment & Plan:   Moderate cognitive impairment.  Screening labs unremarkable.  Will complete evaluation with head CT without contrast Hypertension.  Blood pressure is in the low normal range.  Complaints to include weakness and dizziness.  Amlodipine will be discontinued Insomnia.  Patient encouraged to avoid daytime naps and sleeping pills discouraged at this visit  Follow-up 3 months Patient report any new or worsening symptoms Increase Aricept 10 mg daily   Rogelia Boga

## 2017-11-29 DIAGNOSIS — Z23 Encounter for immunization: Secondary | ICD-10-CM | POA: Diagnosis not present

## 2017-11-29 DIAGNOSIS — S41101A Unspecified open wound of right upper arm, initial encounter: Secondary | ICD-10-CM | POA: Diagnosis not present

## 2017-11-29 DIAGNOSIS — I10 Essential (primary) hypertension: Secondary | ICD-10-CM | POA: Diagnosis not present

## 2017-11-29 DIAGNOSIS — M79621 Pain in right upper arm: Secondary | ICD-10-CM | POA: Diagnosis not present

## 2017-12-03 ENCOUNTER — Ambulatory Visit
Admission: RE | Admit: 2017-12-03 | Discharge: 2017-12-03 | Disposition: A | Discharge status: Home / Self Care | Payer: Medicare Other | Source: Ambulatory Visit | Attending: Internal Medicine | Admitting: Internal Medicine

## 2017-12-03 DIAGNOSIS — F0150 Vascular dementia without behavioral disturbance: Secondary | ICD-10-CM

## 2017-12-03 DIAGNOSIS — R4020 Unspecified coma: Secondary | ICD-10-CM | POA: Diagnosis not present

## 2017-12-03 DIAGNOSIS — R4189 Other symptoms and signs involving cognitive functions and awareness: Secondary | ICD-10-CM

## 2017-12-05 ENCOUNTER — Telehealth: Payer: Self-pay | Admitting: Internal Medicine

## 2017-12-05 NOTE — Telephone Encounter (Signed)
Copied from [place] [id number]. Topic: Quick Communication - See Telephone Encounter >> Dec 05, 2017 12:59 PM Clack, Laban Emperor wrote: CRM for notification. See Telephone encounter for: 12/05/17.  Pt wife Joshua Lopez would like a call for CT results.

## 2017-12-06 ENCOUNTER — Telehealth: Payer: Self-pay | Admitting: Internal Medicine

## 2017-12-06 NOTE — Telephone Encounter (Signed)
Copied from [place] [id number]. Topic: Quick Communication - Rx Refill/Question >> Dec 06, 2017  8:18 AM Robina Ade, Helene Kelp D wrote: Medication: donepezil (ARICEPT ODT) 10 MG disintegrating tablet Has the patient contacted their pharmacy? {Yes but they told him it was too soon to refill. Dr. Burnice Logan told patient to take 10 mg and not 5mg  and pharmacy won't give it to patient due to it's too soon but the doze has changed. Please call patient to let them know if it was corrected. (Agent: If no, request that the patient contact the pharmacy for the refill.) Preferred Pharmacy (with phone number or street name): [place], [place] - [address] Agent: Please be advised that RX refills may take up to 3 business days. We ask that you follow-up with your pharmacy.

## 2017-12-06 NOTE — Telephone Encounter (Signed)
Medication was sent in 11/26/2017

## 2017-12-06 NOTE — Telephone Encounter (Signed)
Spoke with patient's wife- she is going to contact the pharmacy to request the new Rx with the new dosing. She will let the office know if she has any trouble. She reports that her husband is complaining of trouble sleeping- although she gave him 2 melatonin last night and he slept well. She states that he seems more anxious and has sporadic dizzy spells. She is going to monitor them and she will call if she feels she needs to move his appointment up- he has a follow up in 3 months. She is also awaiting a call about his recent scan results and she states if she is not at home- s message may be left on the answering machine.

## 2017-12-06 NOTE — Telephone Encounter (Signed)
Pt and wife notified of results. No futher info needed

## 2017-12-06 NOTE — Telephone Encounter (Signed)
Attempted to notify but no answer on home phone.  Please notify that head CT was unremarkable except for age-related vascular changes

## 2017-12-06 NOTE — Telephone Encounter (Signed)
Please result an advise

## 2017-12-10 ENCOUNTER — Ambulatory Visit: Payer: Medicare Other | Admitting: Internal Medicine

## 2017-12-11 DIAGNOSIS — R350 Frequency of micturition: Secondary | ICD-10-CM | POA: Diagnosis not present

## 2017-12-11 DIAGNOSIS — R351 Nocturia: Secondary | ICD-10-CM | POA: Diagnosis not present

## 2017-12-11 DIAGNOSIS — N401 Enlarged prostate with lower urinary tract symptoms: Secondary | ICD-10-CM | POA: Diagnosis not present

## 2017-12-17 ENCOUNTER — Other Ambulatory Visit: Payer: Self-pay

## 2017-12-17 ENCOUNTER — Encounter (HOSPITAL_COMMUNITY): Payer: Self-pay | Admitting: Emergency Medicine

## 2017-12-17 ENCOUNTER — Ambulatory Visit (HOSPITAL_COMMUNITY)
Admission: EM | Admit: 2017-12-17 | Discharge: 2017-12-17 | Disposition: A | Discharge status: Home / Self Care | Payer: Medicare Other | Attending: Family Medicine | Admitting: Family Medicine

## 2017-12-17 DIAGNOSIS — I10 Essential (primary) hypertension: Secondary | ICD-10-CM

## 2017-12-17 DIAGNOSIS — G4700 Insomnia, unspecified: Secondary | ICD-10-CM | POA: Diagnosis not present

## 2017-12-17 MED ORDER — TRAZODONE HCL 50 MG PO TABS: 50.0000 mg | ORAL_TABLET | Freq: Every day | ORAL | 0 refills | Status: DC

## 2017-12-17 NOTE — Discharge Instructions (Addendum)
Take traozodone at bedtime (1 tablet) to help with insomnia. Any sleeping aid has risks including risk of falls/worsening confusion, though this has a low rate of these risks. You will need to follow up with Dr. Raliegh Ip as soon as possible. While aricept may be causing this symptom, you will need to discuss alternatives with him prior to stopping.

## 2017-12-17 NOTE — ED Triage Notes (Addendum)
Was placed on aricept 2 weeks ago.  Patient having difficulty sleeping, hot and cold episodes.    Patient is wanting to get some sleep.

## 2017-12-17 NOTE — ED Provider Notes (Signed)
[place]    CSN: [id number] Arrival date & time: 12/17/17  1011     History   Chief Complaint Chief Complaint  Patient presents with  . Insomnia    HPI Joshua Lopez is a 84 y.o. male with a history of BPH, HTN, and mild cognitive impairment on aricept who presents with his wife for several weeks of nightly, worsening, now severe insomnia. They feel he has had difficulty sleeping for the first time in his life since starting aricept several weeks ago by his PCP. Melatonin did not help. No other treatments tried.   HPI  Past Medical History:  Diagnosis Date  . Arthritis    diffuse  . BPH (benign prostatic hypertrophy)    Dr. Risa Grill monitors  elevated PSA  . Chronic lower back pain 05-10-12   States pain is related to hip  . Colon polyp    2006 and 2010  . Esophageal stricture   . GERD (gastroesophageal reflux disease)   . Hyperlipidemia    NMR  2007  . Hypertension   . Subclavian steal syndrome 1997    Patient Active Problem List   Diagnosis Date Noted  . Cognitive impairment 11/27/2017  . Seasonal rhinitis 06/10/2012  . Degenerative arthritis of hip 05/14/2012  . Osteoarthritis 01/22/2009  . History of colonic polyps 12/01/2008  . SPINAL STENOSIS, LUMBAR 06/04/2008  . HYPERLIPIDEMIA 04/11/2007  . Essential hypertension 04/11/2007  . GERD 04/11/2007  . BENIGN PROSTATIC HYPERTROPHY 04/11/2007  . ESOPHAGEAL STRICTURE 12/12/2006  . Other specified abnormal findings of blood chemistry 12/12/2006  . PSA, INCREASED 12/12/2006    Past Surgical History:  Procedure Laterality Date  . BACK SURGERY  05-10-12   lumbar fusion with hardware  . CATARACT EXTRACTION W/ INTRAOCULAR LENS  IMPLANT, BILATERAL    . COLONOSCOPY W/ POLYPECTOMY  2006 & 2010  . ESOPHAGEAL DILATION  2003  . GANGLION CYST EXCISION  1999   LUE  . GANGLION CYST EXCISION  05/14/2012   Procedure: REMOVAL GANGLION OF WRIST;  Surgeon: Mcarthur Rossetti, MD;  Location: [facility];   Service: Orthopedics;  Laterality: Right;  Excision Right Wrist Ganglion Cyst  . JOINT REPLACEMENT    . KNEE ARTHROSCOPY  2001   left  . POSTERIOR FUSION LUMBAR SPINE  02/08/12   L2-L4  . PROSTATE BIOPSY  2000   Dr. Rosana Hoes  . Success   right side  . TOTAL HIP ARTHROPLASTY  05/14/2012   Procedure: TOTAL HIP ARTHROPLASTY ANTERIOR APPROACH;  Surgeon: Mcarthur Rossetti, MD;  Location: [facility];  Service: Orthopedics;  Laterality: Right;  Right Total Hip Arthroplasty, Anterior Approach (C-Arm)  . TOTAL SHOULDER ARTHROPLASTY  2010   Dr. Mardelle Matte; right       Home Medications    Prior to Admission medications   Medication Sig Start Date End Date Taking? Authorizing Provider  acetaminophen (TYLENOL) 500 MG tablet Take 500 mg by mouth every 6 (six) hours as needed. For pain    [provider]  Calcium Carbonate-Vitamin D (CALTRATE 600+D PO) Take 1 tablet by mouth 2 (two) times daily.     [provider]  donepezil (ARICEPT ODT) 10 MG disintegrating tablet Take 1 tablet (10 mg total) by mouth at bedtime. 11/27/17   Marletta Lor, MD  escitalopram (LEXAPRO) 5 MG tablet Take 1 tablet (5 mg total) by mouth daily. 11/12/17   Marletta Lor, MD  fluocinonide cream (LIDEX) 0.05 % fluocinonide 0.05 % topical  cream 11/03/16   [provider]  fluticasone (FLONASE) 50 MCG/ACT nasal spray 1 spray in each nostril twice a day as needed. Use the "crossover" technique as discussed 06/10/12   Hendricks Limes, MD  Glucosamine 750 MG TABS Take 1 tablet by mouth 2 (two) times daily.    [provider]  losartan (COZAAR) 25 MG tablet TAKE 1 TABLET (25 MG TOTAL) BY MOUTH DAILY. 01/23/17   Marletta Lor, MD  meloxicam [facility]) 7.5 MG tablet  04/28/13   [provider]  montelukast (SINGULAIR) 10 MG tablet Take 1 tablet (10 mg total) by mouth at bedtime. 04/23/17   Marletta Lor, MD  pantoprazole (PROTONIX) 40 MG tablet TAKE 1  TABLET EVERY DAY 30 MINUTES BEFORE A MEAL  09/11/17   Marletta Lor, MD  polyethylene glycol [facility] Floria Raveling) packet Take 17 g by mouth 2 (two) times daily.     [provider]  pravastatin (PRAVACHOL) 20 MG tablet TAKE 1 TABLET AT BEDTIME 09/11/17   Marletta Lor, MD  SAW PALMETTO, SERENOA REPENS, PO Take 1 tablet by mouth every morning.     [provider]  Sodium Fluoride (FLUORIDEX ENHANCED WHITENING) 1.1 % PSTE Place 1 application onto teeth daily at 8 pm. 06/05/16   Marletta Lor, MD  tamsulosin [facility]) 0.4 MG CAPS capsule TAKE 1 CAPSULE AT BEDTIME 04/19/17   Marletta Lor, MD  traZODone (DESYREL) 50 MG tablet Take 1 tablet (50 mg total) by mouth at bedtime. 12/17/17   Patrecia Pour, MD  triamcinolone cream (KENALOG) 0.1 %  02/07/16   [provider]    Family History Family History  Problem Relation Age of Onset  . Sudden death Father        murdered  . Hypertension Mother   . Skin cancer Mother   . Skin cancer Brother   . Hypertension Brother   . Coronary artery disease Brother        CABG  . Colon cancer Paternal Uncle   . Breast cancer Paternal Grandmother   . Breast cancer Paternal Aunt   . Colon cancer Paternal Aunt   . Heart attack Paternal Uncle   . Kidney disease Neg Hx   . Diabetes Neg Hx     Social History Social History   Tobacco Use  . Smoking status: Former Smoker    Packs/day: 0.50    Years: 33.00    Pack years: 16.50    Types: Cigarettes    Last attempt to quit: 07/24/1973    Years since quitting: 44.4  . Smokeless tobacco: Former Systems developer    Types: Chew    Quit date: 07/24/1974  . Tobacco comment: smoked [place] , up to 1/3 ppd  Substance Use Topics  . Alcohol use: No  . Drug use: No     Allergies   Aspirin   Review of Systems Review of Systems   Physical Exam Triage Vital Signs ED Triage Vitals  Enc Vitals Group     BP 12/17/17 1104 (!) 151/83     Pulse Rate 12/17/17 1104 62      Resp 12/17/17 1104 20     Temp 12/17/17 1104 (!) 97.4 F (36.3 C)     Temp Source 12/17/17 1104 Oral     SpO2 12/17/17 1104 97 %     Weight --      Height --      Head Circumference --  Peak Flow --      Pain Score 12/17/17 1102 0     Pain Loc --      Pain Edu? --      Excl. in [place]? --    No data found.  Updated Vital Signs BP (!) 151/83 (BP Location: Left Arm)   Pulse 62   Temp (!) 97.4 F (36.3 C) (Oral)   Resp 20   SpO2 97%   Physical Exam BP (!) 151/83 (BP Location: Left Arm)   Pulse 62   Temp (!) 97.4 F (36.3 C) (Oral)   Resp 20   SpO2 97%  Gen: Elderly, pleasant, well-appearing 84 y.o.male in NAD HEENT: MMM, posterior oropharynx clear Pulm: Non-labored; CTAB, no wheezes  CV: Regular rate, no murmur appreciated; distal pulses intact/symmetric GI: + BS; soft, non-tender, non-distended Skin: No rashes, wounds, ulcers Neuro: Alert, oriented to place, situation. Thought it was 2020. No focal deficits. Walking unassisted.  UC Treatments / Results  Labs (all labs ordered are listed, but only abnormal results are displayed) Labs Reviewed - No data to display  EKG None  Radiology No results found.  Procedures Procedures (including critical care time)  Medications Ordered in UC Medications - No data to display  Initial Impression / Assessment and Plan / UC Course  I have reviewed the triage vital signs and the nursing notes.  Pertinent labs & imaging results that were available during my care of the patient were reviewed by me and considered in my medical decision making (see chart for details).      Insomnia: Seems to have fair sleep hygiene, which was reviewed. Reviewed immense risk of sleeping aids in the elderly population. However, this seems to be having a significant impact on his (and his wife's) quality of life and they would like to proceed with medication change. - Will trial trazodone and have him follow up with his PCP. - MMSE of 16/30  indicates the need for treatment. Insomnia is reported in 2-14% of patients on aricept (more common than I thought). I've advised the patient to continue this medication for now until his PCP (who knows him well) can follow up with him this week.   HTN: BP elevated, though was low at last PCP office visit and amlodipine DC'ed. Of course will defer to PCP for further management.   Final Clinical Impressions(s) / UC Diagnoses   Final diagnoses:  Insomnia, unspecified type  Hypertension, unspecified type     Discharge Instructions     Take traozodone at bedtime (1 tablet) to help with insomnia. Any sleeping aid has risks including risk of falls/worsening confusion, though this has a low rate of these risks. You will need to follow up with Dr. Raliegh Ip as soon as possible. While aricept may be causing this symptom, you will need to discuss alternatives with him prior to stopping.      ED Prescriptions    Medication Sig Dispense Auth. Provider   traZODone (DESYREL) 50 MG tablet Take 1 tablet (50 mg total) by mouth at bedtime. 10 tablet Patrecia Pour, MD       Patrecia Pour, MD 12/17/17 [phone]

## 2017-12-18 ENCOUNTER — Other Ambulatory Visit: Payer: Self-pay | Admitting: Internal Medicine

## 2017-12-18 ENCOUNTER — Encounter: Payer: Self-pay | Admitting: Internal Medicine

## 2017-12-18 ENCOUNTER — Ambulatory Visit (INDEPENDENT_AMBULATORY_CARE_PROVIDER_SITE_OTHER): Payer: Medicare Other | Admitting: Internal Medicine

## 2017-12-18 VITALS — BP 90/60 | HR 74 | Temp 97.7°F | Wt 199.0 lb

## 2017-12-18 DIAGNOSIS — I10 Essential (primary) hypertension: Secondary | ICD-10-CM

## 2017-12-18 DIAGNOSIS — G4709 Other insomnia: Secondary | ICD-10-CM

## 2017-12-18 DIAGNOSIS — E782 Mixed hyperlipidemia: Secondary | ICD-10-CM

## 2017-12-18 DIAGNOSIS — R4189 Other symptoms and signs involving cognitive functions and awareness: Secondary | ICD-10-CM

## 2017-12-18 NOTE — Patient Instructions (Addendum)
Discontinue Aricept  Continue trazodone 50 mg 30 minutes prior to bedtime   Insomnia Insomnia is a sleep disorder that makes it difficult to fall asleep or to stay asleep. Insomnia can cause tiredness (fatigue), low energy, difficulty concentrating, mood swings, and poor performance at work or school. There are three different ways to classify insomnia:  Difficulty falling asleep.  Difficulty staying asleep.  Waking up too early in the morning.  Any type of insomnia can be long-term (chronic) or short-term (acute). Both are common. Short-term insomnia usually lasts for three months or less. Chronic insomnia occurs at least three times a week for longer than three months. What are the causes? Insomnia may be caused by another condition, situation, or substance, such as:  Anxiety.  Certain medicines.  Gastroesophageal reflux disease (GERD) or other gastrointestinal conditions.  Asthma or other breathing conditions.  Restless legs syndrome, sleep apnea, or other sleep disorders.  Chronic pain.  Menopause. This may include hot flashes.  Stroke.  Abuse of alcohol, tobacco, or illegal drugs.  Depression.  Caffeine.  Neurological disorders, such as Alzheimer disease.  An overactive thyroid (hyperthyroidism).  The cause of insomnia may not be known. What increases the risk? Risk factors for insomnia include:  Gender. Women are more commonly affected than men.  Age. Insomnia is more common as you get older.  Stress. This may involve your professional or personal life.  Income. Insomnia is more common in people with lower income.  Lack of exercise.  Irregular work schedule or night shifts.  Traveling between different time zones.  What are the signs or symptoms? If you have insomnia, trouble falling asleep or trouble staying asleep is the main symptom. This may lead to other symptoms, such as:  Feeling fatigued.  Feeling nervous about going to sleep.  Not  feeling rested in the morning.  Having trouble concentrating.  Feeling irritable, anxious, or depressed.  How is this treated? Treatment for insomnia depends on the cause. If your insomnia is caused by an underlying condition, treatment will focus on addressing the condition. Treatment may also include:  Medicines to help you sleep.  Counseling or therapy.  Lifestyle adjustments.  Follow these instructions at home:  Take medicines only as directed by your health care provider.  Keep regular sleeping and waking hours. Avoid naps.  Keep a sleep diary to help you and your health care provider figure out what could be causing your insomnia. Include: ? When you sleep. ? When you wake up during the night. ? How well you sleep. ? How rested you feel the next day. ? Any side effects of medicines you are taking. ? What you eat and drink.  Make your bedroom a comfortable place where it is easy to fall asleep: ? Put up shades or special blackout curtains to block light from outside. ? Use a white noise machine to block noise. ? Keep the temperature cool.  Exercise regularly as directed by your health care provider. Avoid exercising right before bedtime.  Use relaxation techniques to manage stress. Ask your health care provider to suggest some techniques that may work well for you. These may include: ? Breathing exercises. ? Routines to release muscle tension. ? Visualizing peaceful scenes.  Cut back on alcohol, caffeinated beverages, and cigarettes, especially close to bedtime. These can disrupt your sleep.  Do not overeat or eat spicy foods right before bedtime. This can lead to digestive discomfort that can make it hard for you to sleep.  Limit screen  use before bedtime. This includes: ? Watching TV. ? Using your smartphone, tablet, and computer.  Stick to a routine. This can help you fall asleep faster. Try to do a quiet activity, brush your teeth, and go to bed at the same  time each night.  Get out of bed if you are still awake after 15 minutes of trying to sleep. Keep the lights down, but try reading or doing a quiet activity. When you feel sleepy, go back to bed.  Make sure that you drive carefully. Avoid driving if you feel very sleepy.  Keep all follow-up appointments as directed by your health care provider. This is important. Contact a health care provider if:  You are tired throughout the day or have trouble in your daily routine due to sleepiness.  You continue to have sleep problems or your sleep problems get worse. Get help right away if:  You have serious thoughts about hurting yourself or someone else. This information is not intended to replace advice given to you by your health care provider. Make sure you discuss any questions you have with your health care provider. Document Released: 07/07/2000 Document Revised: 12/10/2015 Document Reviewed: 04/10/2014 Elsevier Interactive Patient Education  [employer].

## 2017-12-18 NOTE — Progress Notes (Signed)
Subjective:    Patient ID: Joshua Lopez, male    DOB: 1930-05-17, 84 y.o.   MRN: [id number]  HPI  84 year old patient who is seen today with a chief complaint of persistent and worsening insomnia.  The patient was seen approximately 5 weeks ago and complaints at that time included memory impairment insomnia anorexia.  He also complained of excessive worrying that he felt was interfering with his ability to sleep. MMSE was consistent with moderate cognitive impairment and he was placed on Aricept initially 5 mg and later uptitrated to 10 mg.  He is also placed on Lexapro 5 mg daily The patient continues to have significant insomnia.  He feels this has intensified and is concerned about medication perhaps being a factor.  He was seen at the urgent care yesterday and placed on trazodone.  He states that prior to the insomnia concerns he slept very well through the night and also enjoyed daytime naps.  He states that he gets quite tired around 8 PM but often has times with sleep initiation and maintenance.  He typically gets up at 6 AM  Past Medical History:  Diagnosis Date  . Arthritis    diffuse  . BPH (benign prostatic hypertrophy)    Dr. Isabel Caprice monitors  elevated PSA  . Chronic lower back pain 05-10-12   States pain is related to hip  . Colon polyp    2006 and 2010  . Esophageal stricture   . GERD (gastroesophageal reflux disease)   . Hyperlipidemia    NMR  2007  . Hypertension   . Subclavian steal syndrome 1997     Social History   Socioeconomic History  . Marital status: Married    Spouse name: Not on file  . Number of children: Not on file  . Years of education: Not on file  . Highest education level: Not on file  Occupational History  . Not on file  Social Needs  . Financial resource strain: Not on file  . Food insecurity:    Worry: Not on file    Inability: Not on file  . Transportation needs:    Medical: Not on file    Non-medical: Not on file  Tobacco Use    . Smoking status: Former Smoker    Packs/day: 0.50    Years: 33.00    Pack years: 16.50    Types: Cigarettes    Last attempt to quit: 07/24/1973    Years since quitting: 44.4  . Smokeless tobacco: Former Neurosurgeon    Types: Chew    Quit date: 07/24/1974  . Tobacco comment: smoked 1949- 1975 , up to 1/3 ppd  Substance and Sexual Activity  . Alcohol use: No  . Drug use: No  . Sexual activity: Yes  Lifestyle  . Physical activity:    Days per week: Not on file    Minutes per session: Not on file  . Stress: Not on file  Relationships  . Social connections:    Talks on phone: Not on file    Gets together: Not on file    Attends religious service: Not on file    Active member of club or organization: Not on file    Attends meetings of clubs or organizations: Not on file    Relationship status: Not on file  . Intimate partner violence:    Fear of current or ex partner: Not on file    Emotionally abused: Not on file    Physically abused: Not  on file    Forced sexual activity: Not on file  Other Topics Concern  . Not on file  Social History Narrative  . Not on file    Past Surgical History:  Procedure Laterality Date  . BACK SURGERY  05-10-12   lumbar fusion with hardware  . CATARACT EXTRACTION W/ INTRAOCULAR LENS  IMPLANT, BILATERAL    . COLONOSCOPY W/ POLYPECTOMY  2006 & 2010  . ESOPHAGEAL DILATION  2003  . GANGLION CYST EXCISION  1999   LUE  . GANGLION CYST EXCISION  05/14/2012   Procedure: REMOVAL GANGLION OF WRIST;  Surgeon: Kathryne Hitch, MD;  Location: [facility];  Service: Orthopedics;  Laterality: Right;  Excision Right Wrist Ganglion Cyst  . JOINT REPLACEMENT    . KNEE ARTHROSCOPY  2001   left  . POSTERIOR FUSION LUMBAR SPINE  02/08/12   L2-L4  . PROSTATE BIOPSY  2000   Dr. Earlene Plater  . SUBCLAVIAN STENT PLACEMENT  1997   right side  . TOTAL HIP ARTHROPLASTY  05/14/2012   Procedure: TOTAL HIP ARTHROPLASTY ANTERIOR APPROACH;  Surgeon: Kathryne Hitch, MD;   Location: [facility];  Service: Orthopedics;  Laterality: Right;  Right Total Hip Arthroplasty, Anterior Approach (C-Arm)  . TOTAL SHOULDER ARTHROPLASTY  2010   Dr. Dion Saucier; right    Family History  Problem Relation Age of Onset  . Sudden death Father        murdered  . Hypertension Mother   . Skin cancer Mother   . Skin cancer Brother   . Hypertension Brother   . Coronary artery disease Brother        CABG  . Colon cancer Paternal Uncle   . Breast cancer Paternal Grandmother   . Breast cancer Paternal Aunt   . Colon cancer Paternal Aunt   . Heart attack Paternal Uncle   . Kidney disease Neg Hx   . Diabetes Neg Hx     Allergies  Allergen Reactions  . Aspirin Rash and Other (See Comments)    Dyspnea, eyelid edema, truncal urticaria--Patient tolerates other NSAIDS at home Because of a history of documented adverse serious drug reaction;Medi Alert bracelet  is recommended    Current Outpatient Medications on File Prior to Visit  Medication Sig Dispense Refill  . acetaminophen (TYLENOL) 500 MG tablet Take 500 mg by mouth every 6 (six) hours as needed. For pain    . Calcium Carbonate-Vitamin D (CALTRATE 600+D PO) Take 1 tablet by mouth 2 (two) times daily.     Marland Kitchen escitalopram (LEXAPRO) 5 MG tablet Take 1 tablet (5 mg total) by mouth daily. 60 tablet 2  . fluocinonide cream (LIDEX) 0.05 % fluocinonide 0.05 % topical cream    . fluticasone (FLONASE) 50 MCG/ACT nasal spray 1 spray in each nostril twice a day as needed. Use the "crossover" technique as discussed 16 g 2  . Glucosamine 750 MG TABS Take 1 tablet by mouth 2 (two) times daily.    Marland Kitchen losartan (COZAAR) 25 MG tablet TAKE 1 TABLET (25 MG TOTAL) BY MOUTH DAILY. 90 tablet 3  . meloxicam (MOBIC) 7.5 MG tablet     . montelukast (SINGULAIR) 10 MG tablet Take 1 tablet (10 mg total) by mouth at bedtime. 90 tablet 3  . pantoprazole (PROTONIX) 40 MG tablet TAKE 1 TABLET EVERY DAY 30 MINUTES BEFORE A MEAL  90 tablet 1  . polyethylene glycol  (MIRALAX / GLYCOLAX) packet Take 17 g by mouth 2 (two) times daily.     Marland Kitchen  pravastatin (PRAVACHOL) 20 MG tablet TAKE 1 TABLET AT BEDTIME 90 tablet 1  . SAW PALMETTO, SERENOA REPENS, PO Take 1 tablet by mouth every morning.     . Sodium Fluoride (FLUORIDEX ENHANCED WHITENING) 1.1 % PSTE Place 1 application onto teeth daily at 8 pm. 4 Bottle 6  . tamsulosin (FLOMAX) 0.4 MG CAPS capsule TAKE 1 CAPSULE AT BEDTIME 90 capsule 3  . traZODone (DESYREL) 50 MG tablet Take 1 tablet (50 mg total) by mouth at bedtime. 10 tablet 0  . triamcinolone cream (KENALOG) 0.1 %      No current facility-administered medications on file prior to visit.     BP 90/60 (BP Location: Right Arm, Patient Position: Sitting, Cuff Size: Large)   Pulse 74   Temp 97.7 F (36.5 C) (Oral)   Wt 199 lb (90.3 kg)   SpO2 98%   BMI 29.82 kg/m      Review of Systems  Constitutional: Negative for appetite change, chills, fatigue and fever.  HENT: Negative for congestion, dental problem, ear pain, hearing loss, sore throat, tinnitus, trouble swallowing and voice change.   Eyes: Negative for pain, discharge and visual disturbance.  Respiratory: Negative for cough, chest tightness, wheezing and stridor.   Cardiovascular: Negative for chest pain, palpitations and leg swelling.  Gastrointestinal: Negative for abdominal distention, abdominal pain, blood in stool, constipation, diarrhea, nausea and vomiting.  Genitourinary: Negative for difficulty urinating, discharge, flank pain, genital sores, hematuria and urgency.  Musculoskeletal: Negative for arthralgias, back pain, gait problem, joint swelling, myalgias and neck stiffness.  Skin: Negative for rash.  Neurological: Negative for dizziness, syncope, speech difficulty, weakness, numbness and headaches.  Hematological: Negative for adenopathy. Does not bruise/bleed easily.  Psychiatric/Behavioral: Positive for confusion, decreased concentration and sleep disturbance. Negative for  behavioral problems and dysphoric mood. The patient is nervous/anxious.        Objective:   Physical Exam  Constitutional: He appears well-developed and well-nourished. No distress.  Repeat blood pressure 120/60  Cardiovascular: Normal rate and regular rhythm.  Pulmonary/Chest: Effort normal and breath sounds normal.          Assessment & Plan:   Insomnia.  This was an issue prior to initiating Aricept but perhaps has gotten worse we will discontinue today.  Sleep hygiene issues discussed and patient information supplied.  He does have a new prescription for trazodone will continue that as well as melatonin and observe  Essential hypertension stable  Follow-up 1 month if unimproved  Rogelia Boga

## 2017-12-24 ENCOUNTER — Other Ambulatory Visit: Payer: Self-pay | Admitting: Internal Medicine

## 2017-12-24 NOTE — Telephone Encounter (Signed)
Copied from [place] [id number]. Topic: Quick Communication - Rx Refill/Question >> Dec 24, 2017  1:44 PM Bea Graff, NT wrote: Medication: traZODone (DESYREL) 50 MG tablet   Has the patient contacted their pharmacy? Yes.   (Agent: If no, request that the patient contact the pharmacy for the refill.) (Agent: If yes, when and what did the pharmacy advise?)  Preferred Pharmacy (with phone number or street name): [place], [place] - [address] [phone] (Phone) [phone] (Fax)      Agent: Please be advised that RX refills may take up to 3 business days. We ask that you follow-up with your pharmacy.

## 2017-12-26 NOTE — Telephone Encounter (Signed)
Rx refill request from outside provider: trazodone 50 mg   Last filled : 12/17/17 #10  LOV: 12/18/17  PCP: [place]: verified

## 2017-12-27 MED ORDER — TRAZODONE HCL 50 MG PO TABS: 50.0000 mg | ORAL_TABLET | Freq: Every day | ORAL | 0 refills | Status: DC

## 2018-01-07 DIAGNOSIS — Z961 Presence of intraocular lens: Secondary | ICD-10-CM | POA: Diagnosis not present

## 2018-01-16 ENCOUNTER — Ambulatory Visit (INDEPENDENT_AMBULATORY_CARE_PROVIDER_SITE_OTHER): Payer: Medicare Other | Admitting: Internal Medicine

## 2018-01-16 ENCOUNTER — Encounter: Payer: Self-pay | Admitting: Internal Medicine

## 2018-01-16 VITALS — BP 140/80 | Temp 97.6°F | Wt 199.0 lb

## 2018-01-16 DIAGNOSIS — E782 Mixed hyperlipidemia: Secondary | ICD-10-CM | POA: Diagnosis not present

## 2018-01-16 DIAGNOSIS — I10 Essential (primary) hypertension: Secondary | ICD-10-CM | POA: Diagnosis not present

## 2018-01-16 DIAGNOSIS — R4189 Other symptoms and signs involving cognitive functions and awareness: Secondary | ICD-10-CM | POA: Diagnosis not present

## 2018-01-16 NOTE — Progress Notes (Signed)
Subjective:    Patient ID: Joshua Lopez, male    DOB: 04-Dec-1929, 84 y.o.   MRN: [id number]  HPI  84 year old patient who is seen today in follow-up.  He has a history of cognitive impairment as well as essential hypertension. He was seen 1 month ago complaining of persistent and worsening insomnia.  He also complained of excessive worry that he felt was interfering with his sleep.  He was placed on Lexapro 5 mg daily and he has had a very nice clinical response.  His appetite and sleep patterns have improved nicely.  Past Medical History:  Diagnosis Date  . Arthritis    diffuse  . BPH (benign prostatic hypertrophy)    Dr. Isabel Caprice monitors  elevated PSA  . Chronic lower back pain 05-10-12   States pain is related to hip  . Colon polyp    2006 and 2010  . Esophageal stricture   . GERD (gastroesophageal reflux disease)   . Hyperlipidemia    NMR  2007  . Hypertension   . Subclavian steal syndrome 1997     Social History   Socioeconomic History  . Marital status: Married    Spouse name: Not on file  . Number of children: Not on file  . Years of education: Not on file  . Highest education level: Not on file  Occupational History  . Not on file  Social Needs  . Financial resource strain: Not on file  . Food insecurity:    Worry: Not on file    Inability: Not on file  . Transportation needs:    Medical: Not on file    Non-medical: Not on file  Tobacco Use  . Smoking status: Former Smoker    Packs/day: 0.50    Years: 33.00    Pack years: 16.50    Types: Cigarettes    Last attempt to quit: 07/24/1973    Years since quitting: 44.5  . Smokeless tobacco: Former Neurosurgeon    Types: Chew    Quit date: 07/24/1974  . Tobacco comment: smoked 1949- 1975 , up to 1/3 ppd  Substance and Sexual Activity  . Alcohol use: No  . Drug use: No  . Sexual activity: Yes  Lifestyle  . Physical activity:    Days per week: Not on file    Minutes per session: Not on file  . Stress: Not on  file  Relationships  . Social connections:    Talks on phone: Not on file    Gets together: Not on file    Attends religious service: Not on file    Active member of club or organization: Not on file    Attends meetings of clubs or organizations: Not on file    Relationship status: Not on file  . Intimate partner violence:    Fear of current or ex partner: Not on file    Emotionally abused: Not on file    Physically abused: Not on file    Forced sexual activity: Not on file  Other Topics Concern  . Not on file  Social History Narrative  . Not on file    Past Surgical History:  Procedure Laterality Date  . BACK SURGERY  05-10-12   lumbar fusion with hardware  . CATARACT EXTRACTION W/ INTRAOCULAR LENS  IMPLANT, BILATERAL    . COLONOSCOPY W/ POLYPECTOMY  2006 & 2010  . ESOPHAGEAL DILATION  2003  . GANGLION CYST EXCISION  1999   LUE  . GANGLION CYST EXCISION  05/14/2012   Procedure: REMOVAL GANGLION OF WRIST;  Surgeon: Kathryne Hitch, MD;  Location: [facility];  Service: Orthopedics;  Laterality: Right;  Excision Right Wrist Ganglion Cyst  . JOINT REPLACEMENT    . KNEE ARTHROSCOPY  2001   left  . POSTERIOR FUSION LUMBAR SPINE  02/08/12   L2-L4  . PROSTATE BIOPSY  2000   Dr. Earlene Plater  . SUBCLAVIAN STENT PLACEMENT  1997   right side  . TOTAL HIP ARTHROPLASTY  05/14/2012   Procedure: TOTAL HIP ARTHROPLASTY ANTERIOR APPROACH;  Surgeon: Kathryne Hitch, MD;  Location: [facility];  Service: Orthopedics;  Laterality: Right;  Right Total Hip Arthroplasty, Anterior Approach (C-Arm)  . TOTAL SHOULDER ARTHROPLASTY  2010   Dr. Dion Saucier; right    Family History  Problem Relation Age of Onset  . Sudden death Father        murdered  . Hypertension Mother   . Skin cancer Mother   . Skin cancer Brother   . Hypertension Brother   . Coronary artery disease Brother        CABG  . Colon cancer Paternal Uncle   . Breast cancer Paternal Grandmother   . Breast cancer Paternal Aunt   .  Colon cancer Paternal Aunt   . Heart attack Paternal Uncle   . Kidney disease Neg Hx   . Diabetes Neg Hx     Allergies  Allergen Reactions  . Aspirin Rash and Other (See Comments)    Dyspnea, eyelid edema, truncal urticaria--Patient tolerates other NSAIDS at home Because of a history of documented adverse serious drug reaction;Medi Alert bracelet  is recommended    Current Outpatient Medications on File Prior to Visit  Medication Sig Dispense Refill  . acetaminophen (TYLENOL) 500 MG tablet Take 500 mg by mouth every 6 (six) hours as needed. For pain    . Calcium Carbonate-Vitamin D (CALTRATE 600+D PO) Take 1 tablet by mouth 2 (two) times daily.     Marland Kitchen escitalopram (LEXAPRO) 5 MG tablet Take 1 tablet (5 mg total) by mouth daily. 60 tablet 2  . losartan (COZAAR) 25 MG tablet TAKE 1 TABLET EVERY DAY 90 tablet 3  . montelukast (SINGULAIR) 10 MG tablet Take 1 tablet (10 mg total) by mouth at bedtime. 90 tablet 3  . polyethylene glycol (MIRALAX / GLYCOLAX) packet Take 17 g by mouth 2 (two) times daily.     . pravastatin (PRAVACHOL) 20 MG tablet TAKE 1 TABLET AT BEDTIME 90 tablet 1  . tamsulosin (FLOMAX) 0.4 MG CAPS capsule TAKE 1 CAPSULE AT BEDTIME 90 capsule 3  . traZODone (DESYREL) 50 MG tablet Take 1 tablet (50 mg total) by mouth at bedtime. 10 tablet 0   No current facility-administered medications on file prior to visit.     BP 140/80 (BP Location: Right Arm, Patient Position: Sitting, Cuff Size: Large)   Temp 97.6 F (36.4 C) (Oral)   Wt 199 lb (90.3 kg)   BMI 29.82 kg/m     Review of Systems  Constitutional: Negative for appetite change, chills, fatigue and fever.  HENT: Negative for congestion, dental problem, ear pain, hearing loss, sore throat, tinnitus, trouble swallowing and voice change.   Eyes: Negative for pain, discharge and visual disturbance.  Respiratory: Negative for cough, chest tightness, wheezing and stridor.   Cardiovascular: Negative for chest pain,  palpitations and leg swelling.  Gastrointestinal: Negative for abdominal distention, abdominal pain, blood in stool, constipation, diarrhea, nausea and vomiting.  Genitourinary: Negative for difficulty urinating,  discharge, flank pain, genital sores, hematuria and urgency.  Musculoskeletal: Negative for arthralgias, back pain, gait problem, joint swelling, myalgias and neck stiffness.  Skin: Negative for rash.  Neurological: Negative for dizziness, syncope, speech difficulty, weakness, numbness and headaches.  Hematological: Negative for adenopathy. Does not bruise/bleed easily.  Psychiatric/Behavioral: Positive for behavioral problems, confusion, decreased concentration and sleep disturbance. Negative for dysphoric mood. The patient is not nervous/anxious.        Objective:   Physical Exam  Constitutional: He is oriented to person, place, and time. He appears well-developed. No distress.  Blood pressure 120/80  HENT:  Head: Normocephalic.  Right Ear: External ear normal.  Left Ear: External ear normal.  Eyes: Conjunctivae and EOM are normal.  Neck: Normal range of motion.  Right carotid bruit  Cardiovascular: Normal rate and normal heart sounds.  Pulmonary/Chest: Breath sounds normal.  Abdominal: Bowel sounds are normal.  Musculoskeletal: Normal range of motion. He exhibits no edema or tenderness.  Neurological: He is alert and oriented to person, place, and time.  Psychiatric: He has a normal mood and affect. His behavior is normal.          Assessment & Plan:  Essential hypertension.  Well-controlled Moderate senile dementia Insomnia improved  No change in medical therapy at this time  Follow-up 3 months  [facility]

## 2018-01-16 NOTE — Patient Instructions (Signed)
Limit your sodium (Salt) intake  Return in 4 months for follow-up  

## 2018-01-28 ENCOUNTER — Other Ambulatory Visit: Payer: Self-pay | Admitting: Internal Medicine

## 2018-02-05 ENCOUNTER — Telehealth: Payer: Self-pay | Admitting: *Deleted

## 2018-02-05 NOTE — Telephone Encounter (Signed)
Copied from [place] [id number]. Topic: General - Other >> Feb 05, 2018  9:37 AM Keene Breath wrote: Reason for CRM: Patient's wife called to verify if patient is still supposed to be taking pantoprazole (PROTONIX) 40 MG tablet.  Please advise.  CB# [phone].  >> Feb 05, 2018  9:58 AM Burchel, Abbi R wrote: Pt's wife states that she spoke with [facility] and they said that pt needs a new rx for pantoprazole before they can refill it.  Please advise.

## 2018-02-05 NOTE — Telephone Encounter (Signed)
Please advise I didn't see it on Pt med list however, he has taking it in the past.

## 2018-02-06 DIAGNOSIS — R351 Nocturia: Secondary | ICD-10-CM | POA: Diagnosis not present

## 2018-02-06 DIAGNOSIS — N401 Enlarged prostate with lower urinary tract symptoms: Secondary | ICD-10-CM | POA: Diagnosis not present

## 2018-02-06 DIAGNOSIS — R3912 Poor urinary stream: Secondary | ICD-10-CM | POA: Diagnosis not present

## 2018-02-06 MED ORDER — PANTOPRAZOLE SODIUM 40 MG PO TBEC: 40.0000 mg | DELAYED_RELEASE_TABLET | Freq: Every day | ORAL | 0 refills | Status: DC

## 2018-02-06 NOTE — Telephone Encounter (Signed)
Rx sent to [facility]. Pt wife informed of update. No further action needed.

## 2018-02-06 NOTE — Telephone Encounter (Signed)
Okay for new refill 40 mg #60

## 2018-02-11 ENCOUNTER — Other Ambulatory Visit: Payer: Self-pay | Admitting: Internal Medicine

## 2018-02-17 ENCOUNTER — Other Ambulatory Visit: Payer: Self-pay | Admitting: Internal Medicine

## 2018-03-04 ENCOUNTER — Ambulatory Visit (INDEPENDENT_AMBULATORY_CARE_PROVIDER_SITE_OTHER): Payer: Medicare Other | Admitting: Internal Medicine

## 2018-03-04 ENCOUNTER — Encounter: Payer: Self-pay | Admitting: Internal Medicine

## 2018-03-04 VITALS — BP 110/70 | HR 80 | Temp 97.6°F | Wt 201.0 lb

## 2018-03-04 DIAGNOSIS — M150 Primary generalized (osteo)arthritis: Secondary | ICD-10-CM | POA: Diagnosis not present

## 2018-03-04 DIAGNOSIS — M159 Polyosteoarthritis, unspecified: Secondary | ICD-10-CM

## 2018-03-04 DIAGNOSIS — R4189 Other symptoms and signs involving cognitive functions and awareness: Secondary | ICD-10-CM | POA: Diagnosis not present

## 2018-03-04 DIAGNOSIS — I10 Essential (primary) hypertension: Secondary | ICD-10-CM

## 2018-03-04 DIAGNOSIS — M8949 Other hypertrophic osteoarthropathy, multiple sites: Secondary | ICD-10-CM

## 2018-03-04 MED ORDER — MONTELUKAST SODIUM 10 MG PO TABS: 10.0000 mg | ORAL_TABLET | Freq: Every day | ORAL | 4 refills | Status: DC

## 2018-03-04 MED ORDER — ESCITALOPRAM OXALATE 5 MG PO TABS: 5.0000 mg | ORAL_TABLET | Freq: Every day | ORAL | 2 refills | Status: DC

## 2018-03-04 MED ORDER — TAMSULOSIN HCL 0.4 MG PO CAPS: 0.4000 mg | ORAL_CAPSULE | Freq: Every day | ORAL | 3 refills | Status: DC

## 2018-03-04 MED ORDER — PANTOPRAZOLE SODIUM 40 MG PO TBEC: 40.0000 mg | DELAYED_RELEASE_TABLET | Freq: Every day | ORAL | 4 refills | Status: DC

## 2018-03-04 MED ORDER — LOSARTAN POTASSIUM 25 MG PO TABS: 25.0000 mg | ORAL_TABLET | Freq: Every day | ORAL | 3 refills | Status: DC

## 2018-03-04 MED ORDER — PRAVASTATIN SODIUM 20 MG PO TABS: 20.0000 mg | ORAL_TABLET | Freq: Every day | ORAL | 4 refills | Status: DC

## 2018-03-04 NOTE — Progress Notes (Signed)
Subjective:    Patient ID: Joshua Lopez, male    DOB: 07/24/1930, 84 y.o.   MRN: [id number]  HPI  84 year old patient who is seen today in follow-up.  He has essential hypertension which has been well controlled. He has a history of  gastroesophageal reflux disease and has failed trials off therapy within 3 days. He continues to do well on Lexapro.  He was placed on this medication due to excessive worry anxiety and impaired sleep.  He and his wife are both pleased with the response.  Trazodone was not helpful. He does have a history of mild cognitive impairment  Past Medical History:  Diagnosis Date  . Arthritis    diffuse  . BPH (benign prostatic hypertrophy)    Dr. Isabel Caprice monitors  elevated PSA  . Chronic lower back pain 05-10-12   States pain is related to hip  . Colon polyp    2006 and 2010  . Esophageal stricture   . GERD (gastroesophageal reflux disease)   . Hyperlipidemia    NMR  2007  . Hypertension   . Subclavian steal syndrome 1997     Social History   Socioeconomic History  . Marital status: Married    Spouse name: Not on file  . Number of children: Not on file  . Years of education: Not on file  . Highest education level: Not on file  Occupational History  . Not on file  Social Needs  . Financial resource strain: Not on file  . Food insecurity:    Worry: Not on file    Inability: Not on file  . Transportation needs:    Medical: Not on file    Non-medical: Not on file  Tobacco Use  . Smoking status: Former Smoker    Packs/day: 0.50    Years: 33.00    Pack years: 16.50    Types: Cigarettes    Last attempt to quit: 07/24/1973    Years since quitting: 44.6  . Smokeless tobacco: Former Neurosurgeon    Types: Chew    Quit date: 07/24/1974  . Tobacco comment: smoked 1949- 1975 , up to 1/3 ppd  Substance and Sexual Activity  . Alcohol use: No  . Drug use: No  . Sexual activity: Yes  Lifestyle  . Physical activity:    Days per week: Not on file    Minutes  per session: Not on file  . Stress: Not on file  Relationships  . Social connections:    Talks on phone: Not on file    Gets together: Not on file    Attends religious service: Not on file    Active member of club or organization: Not on file    Attends meetings of clubs or organizations: Not on file    Relationship status: Not on file  . Intimate partner violence:    Fear of current or ex partner: Not on file    Emotionally abused: Not on file    Physically abused: Not on file    Forced sexual activity: Not on file  Other Topics Concern  . Not on file  Social History Narrative  . Not on file    Past Surgical History:  Procedure Laterality Date  . BACK SURGERY  05-10-12   lumbar fusion with hardware  . CATARACT EXTRACTION W/ INTRAOCULAR LENS  IMPLANT, BILATERAL    . COLONOSCOPY W/ POLYPECTOMY  2006 & 2010  . ESOPHAGEAL DILATION  2003  . GANGLION CYST EXCISION  1999  LUE  . GANGLION CYST EXCISION  05/14/2012   Procedure: REMOVAL GANGLION OF WRIST;  Surgeon: Kathryne Hitch, MD;  Location: [facility];  Service: Orthopedics;  Laterality: Right;  Excision Right Wrist Ganglion Cyst  . JOINT REPLACEMENT    . KNEE ARTHROSCOPY  2001   left  . POSTERIOR FUSION LUMBAR SPINE  02/08/12   L2-L4  . PROSTATE BIOPSY  2000   Dr. Earlene Plater  . SUBCLAVIAN STENT PLACEMENT  1997   right side  . TOTAL HIP ARTHROPLASTY  05/14/2012   Procedure: TOTAL HIP ARTHROPLASTY ANTERIOR APPROACH;  Surgeon: Kathryne Hitch, MD;  Location: [facility];  Service: Orthopedics;  Laterality: Right;  Right Total Hip Arthroplasty, Anterior Approach (C-Arm)  . TOTAL SHOULDER ARTHROPLASTY  2010   Dr. Dion Saucier; right    Family History  Problem Relation Age of Onset  . Sudden death Father        murdered  . Hypertension Mother   . Skin cancer Mother   . Skin cancer Brother   . Hypertension Brother   . Coronary artery disease Brother        CABG  . Colon cancer Paternal Uncle   . Breast cancer Paternal  Grandmother   . Breast cancer Paternal Aunt   . Colon cancer Paternal Aunt   . Heart attack Paternal Uncle   . Kidney disease Neg Hx   . Diabetes Neg Hx     Allergies  Allergen Reactions  . Aspirin Rash and Other (See Comments)    Dyspnea, eyelid edema, truncal urticaria--Patient tolerates other NSAIDS at home Because of a history of documented adverse serious drug reaction;Medi Alert bracelet  is recommended    Current Outpatient Medications on File Prior to Visit  Medication Sig Dispense Refill  . acetaminophen (TYLENOL) 500 MG tablet Take 500 mg by mouth every 6 (six) hours as needed. For pain    . Calcium Carbonate-Vitamin D (CALTRATE 600+D PO) Take 1 tablet by mouth 2 (two) times daily.     . polyethylene glycol (MIRALAX / GLYCOLAX) packet Take 17 g by mouth 2 (two) times daily.      No current facility-administered medications on file prior to visit.     BP 110/70 (BP Location: Right Arm, Patient Position: Sitting, Cuff Size: Large)   Pulse 80   Temp 97.6 F (36.4 C) (Oral)   Wt 201 lb (91.2 kg)   SpO2 97%   BMI 30.12 kg/m     Review of Systems  Constitutional: Negative for appetite change, chills, fatigue and fever.  HENT: Negative for congestion, dental problem, ear pain, hearing loss, sore throat, tinnitus, trouble swallowing and voice change.   Eyes: Negative for pain, discharge and visual disturbance.  Respiratory: Negative for cough, chest tightness, wheezing and stridor.   Cardiovascular: Negative for chest pain, palpitations and leg swelling.  Gastrointestinal: Negative for abdominal distention, abdominal pain, blood in stool, constipation, diarrhea, nausea and vomiting.  Genitourinary: Negative for difficulty urinating, discharge, flank pain, genital sores, hematuria and urgency.  Musculoskeletal: Negative for arthralgias, back pain, gait problem, joint swelling, myalgias and neck stiffness.  Skin: Negative for rash.  Neurological: Negative for dizziness,  syncope, speech difficulty, weakness, numbness and headaches.  Hematological: Negative for adenopathy. Does not bruise/bleed easily.  Psychiatric/Behavioral: Positive for decreased concentration, dysphoric mood and sleep disturbance. Negative for behavioral problems. The patient is not nervous/anxious.        Objective:   Physical Exam  Constitutional: He is oriented to person, place, and  time. He appears well-developed.  Blood pressure well controlled  HENT:  Head: Normocephalic.  Right Ear: External ear normal.  Left Ear: External ear normal.  Eyes: Conjunctivae and EOM are normal.  Neck: Normal range of motion.  Cardiovascular: Normal rate and normal heart sounds.  Pulmonary/Chest: Breath sounds normal.  Abdominal: Bowel sounds are normal.  Musculoskeletal: Normal range of motion. He exhibits no edema or tenderness.  Neurological: He is alert and oriented to person, place, and time.  Psychiatric: He has a normal mood and affect. His behavior is normal.          Assessment & Plan:   Essential hypertension.  Well-controlled Gastroesophageal reflux disease.  Continue PPI therapy BPH.  Continue Flomax Insomnia.  Resolved  No change in medical regimen Patient will follow-up with new provider in approximately 6 months All medications refilled  [facility] Sa[facility]

## 2018-03-04 NOTE — Patient Instructions (Signed)
Limit your sodium (Salt) intake  Please check your blood pressure on a regular basis.  If it is consistently greater than 150/90, please make an office appointment.  Return in 6 months for follow-up   

## 2018-04-16 ENCOUNTER — Other Ambulatory Visit: Payer: Self-pay | Admitting: Internal Medicine

## 2018-04-16 DIAGNOSIS — D1801 Hemangioma of skin and subcutaneous tissue: Secondary | ICD-10-CM | POA: Diagnosis not present

## 2018-04-16 DIAGNOSIS — L821 Other seborrheic keratosis: Secondary | ICD-10-CM | POA: Diagnosis not present

## 2018-04-16 DIAGNOSIS — D485 Neoplasm of uncertain behavior of skin: Secondary | ICD-10-CM | POA: Diagnosis not present

## 2018-04-16 DIAGNOSIS — Z85828 Personal history of other malignant neoplasm of skin: Secondary | ICD-10-CM | POA: Diagnosis not present

## 2018-04-16 DIAGNOSIS — C44319 Basal cell carcinoma of skin of other parts of face: Secondary | ICD-10-CM | POA: Diagnosis not present

## 2018-04-16 DIAGNOSIS — L820 Inflamed seborrheic keratosis: Secondary | ICD-10-CM | POA: Diagnosis not present

## 2018-04-17 NOTE — Telephone Encounter (Signed)
Pt aware to follow up with new PCP for further refills due to Dr.Kwiakowski's retiring.  

## 2018-05-08 ENCOUNTER — Other Ambulatory Visit: Payer: Self-pay | Admitting: Internal Medicine

## 2018-05-20 DIAGNOSIS — C44319 Basal cell carcinoma of skin of other parts of face: Secondary | ICD-10-CM | POA: Diagnosis not present

## 2018-05-20 DIAGNOSIS — Z85828 Personal history of other malignant neoplasm of skin: Secondary | ICD-10-CM | POA: Diagnosis not present

## 2018-06-19 ENCOUNTER — Other Ambulatory Visit: Payer: Self-pay | Admitting: Family Medicine

## 2018-06-19 ENCOUNTER — Other Ambulatory Visit: Payer: Self-pay | Admitting: Internal Medicine

## 2018-06-25 ENCOUNTER — Ambulatory Visit (INDEPENDENT_AMBULATORY_CARE_PROVIDER_SITE_OTHER): Payer: Medicare Other

## 2018-06-25 DIAGNOSIS — Z23 Encounter for immunization: Secondary | ICD-10-CM | POA: Diagnosis not present

## 2018-07-04 ENCOUNTER — Other Ambulatory Visit: Payer: Self-pay | Admitting: Family Medicine

## 2018-07-04 MED ORDER — PANTOPRAZOLE SODIUM 40 MG PO TBEC: 40.0000 mg | DELAYED_RELEASE_TABLET | Freq: Every day | ORAL | 0 refills | Status: DC

## 2018-07-04 MED ORDER — PRAVASTATIN SODIUM 20 MG PO TABS: 20.0000 mg | ORAL_TABLET | Freq: Every day | ORAL | 0 refills | Status: DC

## 2018-07-04 MED ORDER — MONTELUKAST SODIUM 10 MG PO TABS: 10.0000 mg | ORAL_TABLET | Freq: Every day | ORAL | 0 refills | Status: DC

## 2018-07-04 NOTE — Telephone Encounter (Signed)
Pt is transferring care to Dr. Jerilee Hoh.

## 2018-07-11 ENCOUNTER — Ambulatory Visit (INDEPENDENT_AMBULATORY_CARE_PROVIDER_SITE_OTHER): Payer: Medicare Other | Admitting: Internal Medicine

## 2018-07-11 ENCOUNTER — Encounter: Payer: Self-pay | Admitting: Internal Medicine

## 2018-07-11 VITALS — BP 130/80 | HR 70 | Temp 97.7°F | Ht 70.87 in | Wt 208.9 lb

## 2018-07-11 DIAGNOSIS — F411 Generalized anxiety disorder: Secondary | ICD-10-CM | POA: Diagnosis not present

## 2018-07-11 DIAGNOSIS — M159 Polyosteoarthritis, unspecified: Secondary | ICD-10-CM

## 2018-07-11 DIAGNOSIS — M150 Primary generalized (osteo)arthritis: Secondary | ICD-10-CM

## 2018-07-11 DIAGNOSIS — I10 Essential (primary) hypertension: Secondary | ICD-10-CM | POA: Diagnosis not present

## 2018-07-11 DIAGNOSIS — K219 Gastro-esophageal reflux disease without esophagitis: Secondary | ICD-10-CM | POA: Diagnosis not present

## 2018-07-11 DIAGNOSIS — E782 Mixed hyperlipidemia: Secondary | ICD-10-CM | POA: Diagnosis not present

## 2018-07-11 DIAGNOSIS — M8949 Other hypertrophic osteoarthropathy, multiple sites: Secondary | ICD-10-CM

## 2018-07-11 NOTE — Patient Instructions (Signed)
-  It was nice to meet you today!  -Please schedule follow-up in 3 to 4 months for your annual wellness visit.  Please come in fasting at that time as we will do lab work.

## 2018-07-11 NOTE — Progress Notes (Signed)
Established Patient Office Visit     CC/Reason for Visit: Establish care, follow-up on chronic medical conditions  HPI: Joshua Lopez is a 84 y.o. male who is coming in today for the above mentioned reasons.  He is past due for his annual physical exam.  Past Medical History is significant for: GERD that is well controlled on PPI, but as soon as he stops taking it he immediately has reflux, generalized anxiety disorder much improved since starting Lexapro.  Has a history of well-controlled hypertension on Cozaar, BPH on Flomax, has nocturia with approximately 3 nighttime awakenings.  Also has a history of well-controlled hyperlipidemia on pravastatin with the most recent LDL of 62 in May 2018.  He has no acute complaints at today's visit.   Past Medical/Surgical History: Past Medical History:  Diagnosis Date  . Arthritis    diffuse  . BPH (benign prostatic hypertrophy)    Dr. Risa Grill monitors  elevated PSA  . Chronic lower back pain 05-10-12   States pain is related to hip  . Colon polyp    2006 and 2010  . Esophageal stricture   . GERD (gastroesophageal reflux disease)   . Hyperlipidemia    NMR  2007  . Hypertension   . Subclavian steal syndrome 1997    Past Surgical History:  Procedure Laterality Date  . BACK SURGERY  05-10-12   lumbar fusion with hardware  . CATARACT EXTRACTION W/ INTRAOCULAR LENS  IMPLANT, BILATERAL    . COLONOSCOPY W/ POLYPECTOMY  2006 & 2010  . ESOPHAGEAL DILATION  2003  . GANGLION CYST EXCISION  1999   LUE  . GANGLION CYST EXCISION  05/14/2012   Procedure: REMOVAL GANGLION OF WRIST;  Surgeon: Mcarthur Rossetti, MD;  Location: [facility];  Service: Orthopedics;  Laterality: Right;  Excision Right Wrist Ganglion Cyst  . JOINT REPLACEMENT    . KNEE ARTHROSCOPY  2001   left  . POSTERIOR FUSION LUMBAR SPINE  02/08/12   L2-L4  . PROSTATE BIOPSY  2000   Dr. Rosana Hoes  . [place]   right side  . TOTAL HIP ARTHROPLASTY   05/14/2012   Procedure: TOTAL HIP ARTHROPLASTY ANTERIOR APPROACH;  Surgeon: Mcarthur Rossetti, MD;  Location: [facility];  Service: Orthopedics;  Laterality: Right;  Right Total Hip Arthroplasty, Anterior Approach (C-Arm)  . TOTAL SHOULDER ARTHROPLASTY  2010   Dr. Mardelle Matte; right    Social History:  reports that he quit smoking about 44 years ago. His smoking use included cigarettes. He has a 16.50 pack-year smoking history. He quit smokeless tobacco use about 43 years ago.  His smokeless tobacco use included chew. He reports that he does not drink alcohol or use drugs.  Allergies: Allergies  Allergen Reactions  . Aspirin Rash and Other (See Comments)    Dyspnea, eyelid edema, truncal urticaria--Patient tolerates other NSAIDS at home Because of a history of documented adverse serious drug reaction;Medi Alert bracelet  is recommended    Family History:  Family History  Problem Relation Age of Onset  . Sudden death Father        murdered  . Hypertension Mother   . Skin cancer Mother   . Skin cancer Brother   . Hypertension Brother   . Coronary artery disease Brother        CABG  . Colon cancer Paternal Uncle   . Breast cancer Paternal Grandmother   . Breast cancer Paternal Aunt   . Colon cancer  Paternal Aunt   . Heart attack Paternal Uncle   . Kidney disease Neg Hx   . Diabetes Neg Hx      Current Outpatient Medications:  .  acetaminophen (TYLENOL) 500 MG tablet, Take 500 mg by mouth every 6 (six) hours as needed. For pain, Disp: , Rfl:  .  Calcium Carbonate-Vitamin D (CALTRATE 600+D PO), Take 1 tablet by mouth 2 (two) times daily. , Disp: , Rfl:  .  escitalopram (LEXAPRO) 5 MG tablet, TAKE 1 TABLET EVERY DAY, Disp: 90 tablet, Rfl: 2 .  losartan (COZAAR) 25 MG tablet, Take 1 tablet (25 mg total) by mouth daily., Disp: 90 tablet, Rfl: 3 .  montelukast (SINGULAIR) 10 MG tablet, Take 1 tablet (10 mg total) by mouth at bedtime., Disp: 90 tablet, Rfl: 0 .  pantoprazole (PROTONIX)  40 MG tablet, Take 1 tablet (40 mg total) by mouth daily., Disp: 90 tablet, Rfl: 0 .  polyethylene glycol (MIRALAX / GLYCOLAX) packet, Take 17 g by mouth 2 (two) times daily. , Disp: , Rfl:  .  pravastatin (PRAVACHOL) 20 MG tablet, Take 1 tablet (20 mg total) by mouth at bedtime., Disp: 90 tablet, Rfl: 0 .  tamsulosin (FLOMAX) 0.4 MG CAPS capsule, Take 1 capsule (0.4 mg total) by mouth at bedtime., Disp: 90 capsule, Rfl: 3  Review of Systems:  Constitutional: Denies fever, chills, diaphoresis, appetite change and fatigue.  HEENT: Denies photophobia, eye pain, redness, hearing loss, ear pain, congestion, sore throat, rhinorrhea, sneezing, mouth sores, trouble swallowing, neck pain, neck stiffness and tinnitus.   Respiratory: Denies SOB, DOE, cough, chest tightness,  and wheezing.   Cardiovascular: Denies chest pain, palpitations and leg swelling.  Gastrointestinal: Denies nausea, vomiting, abdominal pain, diarrhea, constipation, blood in stool and abdominal distention.  Genitourinary: Denies dysuria, urgency, frequency, hematuria, flank pain and difficulty urinating.  Endocrine: Denies: hot or cold intolerance, sweats, changes in hair or nails, polyuria, polydipsia. Musculoskeletal: Denies myalgias, back pain, joint swelling, arthralgias and gait problem.  Skin: Denies pallor, rash and wound.  Neurological: Denies dizziness, seizures, syncope, weakness, light-headedness, numbness and headaches.  Hematological: Denies adenopathy. Easy bruising, personal or family bleeding history  Psychiatric/Behavioral: Denies suicidal ideation, mood changes, confusion, nervousness, sleep disturbance and agitation    Physical Exam: Vitals:   07/11/18 0753  BP: 130/80  Pulse: 70  Temp: 97.7 F (36.5 C)  TempSrc: Oral  SpO2: 96%  Weight: 208 lb 14.4 oz (94.8 kg)  Height: 5' 10.87" (1.8 m)    Body mass index is 29.25 kg/m.   Constitutional: NAD, calm, comfortable Eyes: PERRL, lids and conjunctivae  normal ENMT: Mucous membranes are moist.  Neck: normal, supple, no masses, no thyromegaly Respiratory: clear to auscultation bilaterally, no wheezing, no crackles. Normal respiratory effort. No accessory muscle use.  Cardiovascular: Regular rate and rhythm, no murmurs / rubs / gallops. No extremity edema. 2+ pedal pulses. No carotid bruits.   Musculoskeletal: no clubbing / cyanosis. No joint deformity upper and lower extremities. Good ROM, no contractures. Normal muscle tone.  Neurologic: CN 2-12 grossly intact. Sensation intact, DTR normal. Strength 5/5 in all 4.  Psychiatric: Normal judgment and insight. Alert and oriented x 3. Normal mood.    Impression and Plan:  Essential hypertension -Well-controlled on Cozaar.  Primary osteoarthritis involving multiple joints -Has had multiple joints replaced, left knee arthroscopy in the past. -He is thinking about scheduling follow-up with orthopedics.  HYPERLIPIDEMIA -Well-controlled with the most recent LDL of 62 in May 2018. -Is on pravastatin 20  mg. -We will repeat lipids with next annual visit.  Gastroesophageal reflux disease without esophagitis -He is symptomatic when he is off of the PPI, is currently on Protonix 40 mg daily.  GAD (generalized anxiety disorder) -Mood and sleep are much improved after starting Lexapro, will continue.    Patient Instructions  -It was nice to meet you today!  -Please schedule follow-up in 3 to 4 months for your annual wellness visit.  Please come in fasting at that time as we will do lab work.     Lelon Frohlich, MD [facility]

## 2018-09-04 ENCOUNTER — Other Ambulatory Visit: Payer: Self-pay | Admitting: Internal Medicine

## 2018-09-16 NOTE — Telephone Encounter (Signed)
Pt's wife called to follow up on status of refills sent from pharmacy on 09/04/18. Please advise.   Pantoprazole Sodium 40 MG Pravastatin Sodium 20 MG  Montelukast Sodium 10 [facility] [place], [place] [phone] (Phone) [phone] (Fax)

## 2018-10-01 ENCOUNTER — Ambulatory Visit (INDEPENDENT_AMBULATORY_CARE_PROVIDER_SITE_OTHER): Payer: Medicare Other | Admitting: Family Medicine

## 2018-10-01 ENCOUNTER — Encounter: Payer: Self-pay | Admitting: Family Medicine

## 2018-10-01 ENCOUNTER — Ambulatory Visit (INDEPENDENT_AMBULATORY_CARE_PROVIDER_SITE_OTHER): Payer: Medicare Other

## 2018-10-01 VITALS — BP 125/80 | HR 79 | Temp 97.8°F | Resp 20 | Ht 70.87 in | Wt 214.0 lb

## 2018-10-01 DIAGNOSIS — R0989 Other specified symptoms and signs involving the circulatory and respiratory systems: Secondary | ICD-10-CM | POA: Diagnosis not present

## 2018-10-01 DIAGNOSIS — R05 Cough: Secondary | ICD-10-CM

## 2018-10-01 DIAGNOSIS — R059 Cough, unspecified: Secondary | ICD-10-CM

## 2018-10-01 DIAGNOSIS — J989 Respiratory disorder, unspecified: Secondary | ICD-10-CM

## 2018-10-01 MED ORDER — PREDNISONE 20 MG PO TABS: 40.0000 mg | ORAL_TABLET | Freq: Every day | ORAL | 0 refills | Status: AC

## 2018-10-01 MED ORDER — BENZONATATE 100 MG PO CAPS: 200.0000 mg | ORAL_CAPSULE | Freq: Two times a day (BID) | ORAL | 0 refills | Status: AC | PRN

## 2018-10-01 MED ORDER — ALBUTEROL SULFATE HFA 108 (90 BASE) MCG/ACT IN AERS: 2.0000 | INHALATION_SPRAY | Freq: Four times a day (QID) | RESPIRATORY_TRACT | 0 refills | Status: DC | PRN

## 2018-10-01 MED ORDER — IPRATROPIUM-ALBUTEROL 0.5-2.5 (3) MG/3ML IN SOLN
3.0000 mL | Freq: Once | RESPIRATORY_TRACT | Status: AC
  Administered 2018-10-01: 3 mL via RESPIRATORY_TRACT

## 2018-10-01 NOTE — Progress Notes (Signed)
ACUTE VISIT  HPI:  Chief Complaint  Patient presents with  . Cough    sx started 3 days ago, cough with mucus causing vomiting  . Chest congestion    [name] is a 85 y.o.male here today with his wife complaining of 3 days of respiratory symptoms. Hx of MCI,so wife helps to complement Hx.  Productive cough with clear sputum, denies hemoptysis. Bilateral epiphora, nasal congestion, rhinorrhea, and postnasal drainage.  Coughing spells that cause nausea and vomiting. Hx of GERD, currently he is on Protonix 40 mg daily.  He also reports intermittent wheezing and dyspnea, denies chest pain. He is not sure about exacerbating or alleviating factors. Lying down aggravates post nasal drainage.  Cough  This is a new problem. The current episode started in the past 7 days. The problem has been unchanged. The problem occurs every few minutes. The cough is productive of sputum. Associated symptoms include nasal congestion, postnasal drip, rhinorrhea, shortness of breath and wheezing. Pertinent negatives include no chest pain, chills, ear congestion, eye redness, fever, headaches, heartburn, hemoptysis, myalgias, rash, sore throat or sweats. The symptoms are aggravated by lying down. He has tried OTC cough suppressant for the symptoms. The treatment provided no relief. His past medical history is significant for environmental allergies. There is no history of asthma or COPD.   No Hx of recent travel or visitors from overseas. No sick contact. No known insect bite.  Hx of allergies: Seasonal allergies, denies history of COPD or asthma. He is on Singulair 10 mg daily.  Former smoker.  OTC medications for this problem: Cough drops.  Review of Systems  Constitutional: Positive for fatigue. Negative for chills and fever.  HENT: Positive for congestion, postnasal drip and rhinorrhea. Negative for sore throat.   Eyes: Positive for discharge. Negative for redness and itching.    Respiratory: Positive for cough, shortness of breath and wheezing. Negative for hemoptysis.   Cardiovascular: Negative for chest pain.  Gastrointestinal: Positive for nausea and vomiting. Negative for abdominal pain, diarrhea and heartburn.  Musculoskeletal: Negative for myalgias.  Skin: Negative for rash.  Allergic/Immunologic: Positive for environmental allergies.  Neurological: Negative for syncope and headaches.  Psychiatric/Behavioral: Positive for sleep disturbance. Negative for confusion. The patient is nervous/anxious.     Current Outpatient Medications on File Prior to Visit  Medication Sig Dispense Refill  . acetaminophen (TYLENOL) 500 MG tablet Take 500 mg by mouth every 6 (six) hours as needed. For pain    . Calcium Carbonate-Vitamin D (CALTRATE 600+D PO) Take 1 tablet by mouth 2 (two) times daily.     Marland Kitchen escitalopram (LEXAPRO) 5 MG tablet TAKE 1 TABLET EVERY DAY 90 tablet 2  . losartan (COZAAR) 25 MG tablet Take 1 tablet (25 mg total) by mouth daily. 90 tablet 3  . montelukast (SINGULAIR) 10 MG tablet TAKE 1 TABLET AT BEDTIME 90 tablet 0  . pantoprazole (PROTONIX) 40 MG tablet TAKE 1 TABLET EVERY DAY 90 tablet 0  . polyethylene glycol (MIRALAX / GLYCOLAX) packet Take 17 g by mouth 2 (two) times daily.     . pravastatin (PRAVACHOL) 20 MG tablet TAKE 1 TABLET AT BEDTIME 90 tablet 0  . tamsulosin (FLOMAX) 0.4 MG CAPS capsule Take 1 capsule (0.4 mg total) by mouth at bedtime. 90 capsule 3   No current facility-administered medications on file prior to visit.      Past Medical History:  Diagnosis Date  . Arthritis    diffuse  .  BPH (benign prostatic hypertrophy)    Dr. Risa Grill monitors  elevated PSA  . Chronic lower back pain 05-10-12   States pain is related to hip  . Colon polyp    2006 and 2010  . Esophageal stricture   . GERD (gastroesophageal reflux disease)   . Hyperlipidemia    NMR  2007  . Hypertension   . Subclavian steal syndrome 1997   Allergies   Allergen Reactions  . Aspirin Rash and Other (See Comments)    Dyspnea, eyelid edema, truncal urticaria--Patient tolerates other NSAIDS at home Because of a history of documented adverse serious drug reaction;Medi Alert bracelet  is recommended Other reaction(s): Other (See Comments) Dyspnea, eyelid edema, truncal urticaria--Patient tolerates other NSAIDS at home Because of a history of documented adverse serious drug reaction;Medi Alert braceletis recommended    Social History   Socioeconomic History  . Marital status: Married    Spouse name: Not on file  . Number of children: Not on file  . Years of education: Not on file  . Highest education level: Not on file  Occupational History  . Not on file  Social Needs  . Financial resource strain: Not on file  . Food insecurity:    Worry: Not on file    Inability: Not on file  . Transportation needs:    Medical: Not on file    Non-medical: Not on file  Tobacco Use  . Smoking status: Former Smoker    Packs/day: 0.50    Years: 33.00    Pack years: 16.50    Types: Cigarettes    Last attempt to quit: 07/24/1973    Years since quitting: 45.2  . Smokeless tobacco: Former Systems developer    Types: Chew    Quit date: 07/24/1974  . Tobacco comment: smoked [place] , up to 1/3 ppd  Substance and Sexual Activity  . Alcohol use: No  . Drug use: No  . Sexual activity: Yes  Lifestyle  . Physical activity:    Days per week: Not on file    Minutes per session: Not on file  . Stress: Not on file  Relationships  . Social connections:    Talks on phone: Not on file    Gets together: Not on file    Attends religious service: Not on file    Active member of club or organization: Not on file    Attends meetings of clubs or organizations: Not on file    Relationship status: Not on file  Other Topics Concern  . Not on file  Social History Narrative  . Not on file    Vitals:   10/01/18 1507  BP: 125/80  Pulse: 79  Resp: 20  Temp: 97.8 F  (36.6 C)  SpO2: 97%   Body mass index is 29.96 kg/m.   Physical Exam  Nursing note and vitals reviewed. Constitutional: He appears well-developed. He does not appear ill. No distress.  HENT:  Head: Normocephalic and atraumatic.  Nose: Rhinorrhea present. Right sinus exhibits no maxillary sinus tenderness and no frontal sinus tenderness. Left sinus exhibits no maxillary sinus tenderness and no frontal sinus tenderness.  Mouth/Throat: Oropharynx is clear and moist and mucous membranes are normal.  Postnasal drainage.  Eyes: Conjunctivae are normal. Right eye exhibits no discharge. Left eye exhibits no discharge.  Cardiovascular: Normal rate and regular rhythm.  No murmur heard. Respiratory: Effort normal. No stridor. No respiratory distress. He has wheezes. He has no rales.  Lymphadenopathy:    He  has no cervical adenopathy.  Neurological: He is alert. He has normal strength.  Oriented in place and person. Stable gait with no assistance.  Skin: Skin is warm. No rash noted. No erythema.  Psychiatric: His mood appears anxious.  Well groomed, good eye contact.   ASSESSMENT AND PLAN:  Mr. Yancey was seen today for cough and chest congestion.  Diagnoses and all orders for this visit:  Reactive airway disease that is not asthma No prior Hx of wheezing. We discussed possible etiologies. ?  Viral illness, allergies, or COPD. Here in the office after verbal consent he received DuoNeb neb treatment, which he tolerated well. After neb treatment wheezing resolved, no rhonchi or rales. Albuterol inh 2 puff every 6 hours for a week then as needed for wheezing or shortness of breath.  We discussed side effects of prednisone, he thinks he has taken it before and was well-tolerated. Instructed about warning signs. According to patient, he has an appointment with PCP next week.  -     predniSONE (DELTASONE) 20 MG tablet; Take 2 tablets (40 mg total) by mouth daily with breakfast for 3  days. -     albuterol (PROVENTIL HFA;VENTOLIN HFA) 108 (90 Base) MCG/ACT inhaler; Inhale 2 puffs into the lungs every 6 (six) hours as needed for wheezing or shortness of breath.  Cough Adequate hydration. Benzonatate recommended for symptomatic treatment. Explained that cough and congestion can last a few days and even weeks after a respiratory illness. Further recommendation will be given according to imaging results.  -     DG Chest 2 View; Future -     benzonatate (TESSALON) 100 MG capsule; Take 2 capsules (200 mg total) by mouth 2 (two) times daily as needed for up to 10 days.     Return if symptoms worsen or fail to improve, for Keep appt with pcp.    Betty G. Martinique, MD  [facility]. [place] office.

## 2018-10-01 NOTE — Patient Instructions (Addendum)
A few things to remember from today's visit:   Reactive airway disease that is not asthma - Plan: predniSONE (DELTASONE) 20 MG tablet, albuterol (PROVENTIL HFA;VENTOLIN HFA) 108 (90 Base) MCG/ACT inhaler  Cough - Plan: DG Chest 2 View, benzonatate (TESSALON) 100 MG capsule  Albuterol inh 2 puff every 6 hours for a week then as needed for wheezing or shortness of breath.  Take prednisone with breakfast. Monitor for fever. Keep appointment with PCP next week.  I hope you feel better soon!

## 2018-10-02 ENCOUNTER — Other Ambulatory Visit: Payer: Self-pay | Admitting: Internal Medicine

## 2018-10-02 ENCOUNTER — Encounter: Payer: Self-pay | Admitting: Family Medicine

## 2018-10-07 NOTE — Telephone Encounter (Signed)
Sent in under Dr.K.  Pt has already established with Dr. Jerilee Hoh.  Wife states that [facility] Mail order asked her to call in and check on this.  Pt states that that we are to call [employer] at [phone].

## 2018-10-10 ENCOUNTER — Encounter: Payer: Self-pay | Admitting: Internal Medicine

## 2018-10-10 ENCOUNTER — Ambulatory Visit (INDEPENDENT_AMBULATORY_CARE_PROVIDER_SITE_OTHER): Payer: Medicare Other | Admitting: Internal Medicine

## 2018-10-10 ENCOUNTER — Other Ambulatory Visit: Payer: Self-pay

## 2018-10-10 VITALS — BP 130/80 | HR 72 | Temp 98.0°F | Ht 69.0 in | Wt 211.0 lb

## 2018-10-10 DIAGNOSIS — F411 Generalized anxiety disorder: Secondary | ICD-10-CM | POA: Diagnosis not present

## 2018-10-10 DIAGNOSIS — E782 Mixed hyperlipidemia: Secondary | ICD-10-CM

## 2018-10-10 DIAGNOSIS — I10 Essential (primary) hypertension: Secondary | ICD-10-CM

## 2018-10-10 DIAGNOSIS — K219 Gastro-esophageal reflux disease without esophagitis: Secondary | ICD-10-CM | POA: Diagnosis not present

## 2018-10-10 DIAGNOSIS — H6123 Impacted cerumen, bilateral: Secondary | ICD-10-CM

## 2018-10-10 DIAGNOSIS — R7309 Other abnormal glucose: Secondary | ICD-10-CM

## 2018-10-10 LAB — CBC WITH DIFFERENTIAL/PLATELET
Basophils Absolute: 0 10*3/uL (ref 0.0–0.1)
Basophils Relative: 0.8 % (ref 0.0–3.0)
Eosinophils Absolute: 0.2 10*3/uL (ref 0.0–0.7)
Eosinophils Relative: 3.1 % (ref 0.0–5.0)
HCT: 42.4 % (ref 39.0–52.0)
Hemoglobin: 14.6 g/dL (ref 13.0–17.0)
Lymphocytes Relative: 25.5 % (ref 12.0–46.0)
Lymphs Abs: 1.3 10*3/uL (ref 0.7–4.0)
MCHC: 34.5 g/dL (ref 30.0–36.0)
MCV: 90.8 fl (ref 78.0–100.0)
Monocytes Absolute: 0.4 10*3/uL (ref 0.1–1.0)
Monocytes Relative: 7.5 % (ref 3.0–12.0)
Neutro Abs: 3.2 10*3/uL (ref 1.4–7.7)
Neutrophils Relative %: 63.1 % (ref 43.0–77.0)
Platelets: 171 10*3/uL (ref 150.0–400.0)
RBC: 4.67 Mil/uL (ref 4.22–5.81)
RDW: 13.4 % (ref 11.5–15.5)
WBC: 5 10*3/uL (ref 4.0–10.5)

## 2018-10-10 LAB — COMPREHENSIVE METABOLIC PANEL
ALT: 15 U/L (ref 0–53)
AST: 12 U/L (ref 0–37)
Albumin: 4.3 g/dL (ref 3.5–5.2)
Alkaline Phosphatase: 77 U/L (ref 39–117)
BUN: 27 mg/dL — ABNORMAL HIGH (ref 6–23)
CO2: 26 mEq/L (ref 19–32)
Calcium: 8.9 mg/dL (ref 8.4–10.5)
Chloride: 104 mEq/L (ref 96–112)
Creatinine, Ser: 1.28 mg/dL (ref 0.40–1.50)
GFR: 53.23 mL/min — ABNORMAL LOW (ref >60.00)
Glucose, Bld: 125 mg/dL — ABNORMAL HIGH (ref 70–99)
Potassium: 4.3 mEq/L (ref 3.5–5.1)
Sodium: 138 mEq/L (ref 135–145)
Total Bilirubin: 0.9 mg/dL (ref 0.2–1.2)
Total Protein: 6.6 g/dL (ref 6.0–8.3)

## 2018-10-10 LAB — LIPID PANEL
Cholesterol: 149 mg/dL (ref 0–200)
HDL: 56.9 mg/dL (ref >39.00)
LDL Cholesterol: 71 mg/dL (ref 0–99)
NonHDL: 92.43
Total CHOL/HDL Ratio: 3
Triglycerides: 108 mg/dL (ref 0.0–149.0)
VLDL: 21.6 mg/dL (ref 0.0–40.0)

## 2018-10-10 LAB — TSH: TSH: 2.9 u[IU]/mL (ref 0.35–4.50)

## 2018-10-10 NOTE — Progress Notes (Signed)
Established Patient Office Visit     CC/Reason for Visit: Yearly follow up chronic conditions  HPI: Joshua Lopez is a 85 y.o. male who is coming in today for the above mentioned reasons. Past Medical History is significant for: GERD that is well controlled on PPI, but as soon as he stops taking it he immediately has reflux, generalized anxiety disorder much improved since starting Lexapro.  Has a history of well-controlled hypertension on Cozaar, BPH on Flomax, has nocturia with approximately 3 nighttime awakenings.  Also has a history of well-controlled hyperlipidemia on pravastatin with the most recent LDL of 62 in May 2018. No acute complaints today.  He has routine eye and dental care.  He has aged out of cancer screening.  He will get tetanus and shingles at the pharmacy.   Past Medical/Surgical History: Past Medical History:  Diagnosis Date  . Arthritis    diffuse  . BPH (benign prostatic hypertrophy)    Dr. Risa Grill monitors  elevated PSA  . Chronic lower back pain 05-10-12   States pain is related to hip  . Colon polyp    2006 and 2010  . Esophageal stricture   . GERD (gastroesophageal reflux disease)   . Hyperlipidemia    NMR  2007  . Hypertension   . Subclavian steal syndrome 1997    Past Surgical History:  Procedure Laterality Date  . BACK SURGERY  05-10-12   lumbar fusion with hardware  . CATARACT EXTRACTION W/ INTRAOCULAR LENS  IMPLANT, BILATERAL    . COLONOSCOPY W/ POLYPECTOMY  2006 & 2010  . ESOPHAGEAL DILATION  2003  . GANGLION CYST EXCISION  1999   LUE  . GANGLION CYST EXCISION  05/14/2012   Procedure: REMOVAL GANGLION OF WRIST;  Surgeon: Mcarthur Rossetti, MD;  Location: [facility];  Service: Orthopedics;  Laterality: Right;  Excision Right Wrist Ganglion Cyst  . JOINT REPLACEMENT    . KNEE ARTHROSCOPY  2001   left  . POSTERIOR FUSION LUMBAR SPINE  02/08/12   L2-L4  . PROSTATE BIOPSY  2000   Dr. Rosana Hoes  . [place]    right side  . TOTAL HIP ARTHROPLASTY  05/14/2012   Procedure: TOTAL HIP ARTHROPLASTY ANTERIOR APPROACH;  Surgeon: Mcarthur Rossetti, MD;  Location: [facility];  Service: Orthopedics;  Laterality: Right;  Right Total Hip Arthroplasty, Anterior Approach (C-Arm)  . TOTAL SHOULDER ARTHROPLASTY  2010   Dr. Mardelle Matte; right    Social History:  reports that he quit smoking about 45 years ago. His smoking use included cigarettes. He has a 16.50 pack-year smoking history. He quit smokeless tobacco use about 44 years ago.  His smokeless tobacco use included chew. He reports that he does not drink alcohol or use drugs.  Allergies: Allergies  Allergen Reactions  . Aspirin Rash and Other (See Comments)    Dyspnea, eyelid edema, truncal urticaria--Patient tolerates other NSAIDS at home Because of a history of documented adverse serious drug reaction;Medi Alert bracelet  is recommended Other reaction(s): Other (See Comments) Dyspnea, eyelid edema, truncal urticaria--Patient tolerates other NSAIDS at home Because of a history of documented adverse serious drug reaction;Medi Alert braceletis recommended    Family History:  Family History  Problem Relation Age of Onset  . Sudden death Father        murdered  . Hypertension Mother   . Skin cancer Mother   . Skin cancer Brother   . Hypertension Brother   . Coronary  artery disease Brother        CABG  . Colon cancer Paternal Uncle   . Breast cancer Paternal Grandmother   . Breast cancer Paternal Aunt   . Colon cancer Paternal Aunt   . Heart attack Paternal Uncle   . Kidney disease Neg Hx   . Diabetes Neg Hx      Current Outpatient Medications:  .  acetaminophen (TYLENOL) 500 MG tablet, Take 500 mg by mouth every 6 (six) hours as needed. For pain, Disp: , Rfl:  .  albuterol (PROVENTIL HFA;VENTOLIN HFA) 108 (90 Base) MCG/ACT inhaler, Inhale 2 puffs into the lungs every 6 (six) hours as needed for wheezing or shortness of breath., Disp: 1  Inhaler, Rfl: 0 .  benzonatate (TESSALON) 100 MG capsule, Take 2 capsules (200 mg total) by mouth 2 (two) times daily as needed for up to 10 days., Disp: 40 capsule, Rfl: 0 .  Calcium Carbonate-Vitamin D (CALTRATE 600+D PO), Take 1 tablet by mouth 2 (two) times daily. , Disp: , Rfl:  .  escitalopram (LEXAPRO) 5 MG tablet, TAKE 1 TABLET EVERY DAY, Disp: 90 tablet, Rfl: 2 .  losartan (COZAAR) 25 MG tablet, TAKE 1 TABLET EVERY DAY, Disp: 90 tablet, Rfl: 1 .  montelukast (SINGULAIR) 10 MG tablet, TAKE 1 TABLET AT BEDTIME, Disp: 90 tablet, Rfl: 0 .  pantoprazole (PROTONIX) 40 MG tablet, TAKE 1 TABLET EVERY DAY, Disp: 90 tablet, Rfl: 0 .  polyethylene glycol (MIRALAX / GLYCOLAX) packet, Take 17 g by mouth 2 (two) times daily. , Disp: , Rfl:  .  pravastatin (PRAVACHOL) 20 MG tablet, TAKE 1 TABLET AT BEDTIME, Disp: 90 tablet, Rfl: 0 .  tamsulosin (FLOMAX) 0.4 MG CAPS capsule, Take 1 capsule (0.4 mg total) by mouth at bedtime., Disp: 90 capsule, Rfl: 3  Review of Systems:  Constitutional: Denies fever, chills, diaphoresis, appetite change and fatigue.  HEENT: Denies photophobia, eye pain, redness, hearing loss, ear pain, congestion, sore throat, rhinorrhea, sneezing, mouth sores, trouble swallowing, neck pain, neck stiffness and tinnitus.   Respiratory: Denies SOB, DOE, cough, chest tightness,  and wheezing.   Cardiovascular: Denies chest pain, palpitations and leg swelling.  Gastrointestinal: Denies nausea, vomiting, abdominal pain, diarrhea, constipation, blood in stool and abdominal distention.  Genitourinary: Denies dysuria, urgency, frequency, hematuria, flank pain and difficulty urinating.  Endocrine: Denies: hot or cold intolerance, sweats, changes in hair or nails, polyuria, polydipsia. Musculoskeletal: Denies myalgias, back pain, joint swelling, arthralgias and gait problem.  Skin: Denies pallor, rash and wound.  Neurological: Denies dizziness, seizures, syncope, weakness, light-headedness,  numbness and headaches.  Hematological: Denies adenopathy. Easy bruising, personal or family bleeding history  Psychiatric/Behavioral: Denies suicidal ideation, mood changes, confusion, nervousness, sleep disturbance and agitation    Physical Exam: Vitals:   10/10/18 0751  BP: 130/80  Pulse: 72  Temp: 98 F (36.7 C)  TempSrc: Oral  SpO2: 95%  Weight: 211 lb (95.7 kg)  Height: '5\' 9"'  (1.753 m)    Body mass index is 31.16 kg/m.   Constitutional: NAD, calm, comfortable Eyes: PERRL, lids and conjunctivae normal ENMT: Mucous membranes are moist. Posterior pharynx clear of any exudate or lesions. Normal dentition. Tympanic membrane is obstructed by cerumen bilaterally. Neck: normal, supple, no masses, no thyromegaly Respiratory: clear to auscultation bilaterally, no wheezing, no crackles. Normal respiratory effort. No accessory muscle use.  Cardiovascular: Regular rate and rhythm, no murmurs / rubs / gallops. No extremity edema. 2+ pedal pulses. No carotid bruits.  Abdomen: no tenderness, no masses  palpated. No hepatosplenomegaly. Bowel sounds positive.  Musculoskeletal: no clubbing / cyanosis. No joint deformity upper and lower extremities. Good ROM, no contractures. Normal muscle tone.  Skin: no rashes, lesions, ulcers. No induration Neurologic: CN 2-12 grossly intact. Sensation intact, DTR normal. Strength 5/5 in all 4.  Psychiatric: Normal judgment and insight. Alert and oriented x 3. Normal mood.    Impression and Plan:  HYPERLIPIDEMIA  -check lipids today, continue pravastatin  Essential hypertension  -Well controlled on losartan.  Gastroesophageal reflux disease without esophagitis -Well controlled on a PPI  GAD (generalized anxiety disorder) -Well controlled on lexapro.  Bilateral impacted cerumen Cerumen Desimpaction  Warm water was applied and gentle ear lavage performed on bilateral ears. There were no complications and following the desimpaction the tympanic  membranes were visible. Tympanic membranes are intact following the procedure. Pearly white in appearance. Auditory canals are normal. The patient reported relief of symptoms after removal of cerumen. Hearing is improved.  Preventive Health -Has routine eye and dental care. -Will get tetanus and shingles series at pharmacy. -Aged out of routine cancer screening. -labs today.    Patient Instructions  -Nice seeing you today!  -Lab work today, will notify you with results.   Preventive Care 50 Years and Older, Male Preventive care refers to lifestyle choices and visits with your health care provider that can promote health and wellness. What does preventive care include?   A yearly physical exam. This is also called an annual well check.  Dental exams once or twice a year.  Routine eye exams. Ask your health care provider how often you should have your eyes checked.  Personal lifestyle choices, including: ? Daily care of your teeth and gums. ? Regular physical activity. ? Eating a healthy diet. ? Avoiding tobacco and drug use. ? Limiting alcohol use. ? Practicing safe sex. ? Taking low doses of aspirin every day. ? Taking vitamin and mineral supplements as recommended by your health care provider. What happens during an annual well check? The services and screenings done by your health care provider during your annual well check will depend on your age, overall health, lifestyle risk factors, and family history of disease. Counseling Your health care provider may ask you questions about your:  Alcohol use.  Tobacco use.  Drug use.  Emotional well-being.  Home and relationship well-being.  Sexual activity.  Eating habits.  History of falls.  Memory and ability to understand (cognition).  Work and work Statistician. Screening You may have the following tests or measurements:  Height, weight, and BMI.  Blood pressure.  Lipid and cholesterol levels. These  may be checked every 5 years, or more frequently if you are over 62 years old.  Skin check.  Lung cancer screening. You may have this screening every year starting at age 54 if you have a 30-pack-year history of smoking and currently smoke or have quit within the past 15 years.  Colorectal cancer screening. All adults should have this screening starting at age 67 and continuing until age 70. You will have tests every 1-10 years, depending on your results and the type of screening test. People at increased risk should start screening at an earlier age. Screening tests may include: ? Guaiac-based fecal occult blood testing. ? Fecal immunochemical test (FIT). ? Stool DNA test. ? Virtual colonoscopy. ? Sigmoidoscopy. During this test, a flexible tube with a tiny camera (sigmoidoscope) is used to examine your rectum and lower colon. The sigmoidoscope is inserted through your anus into  your rectum and lower colon. ? Colonoscopy. During this test, a long, thin, flexible tube with a tiny camera (colonoscope) is used to examine your entire colon and rectum.  Prostate cancer screening. Recommendations will vary depending on your family history and other risks.  Hepatitis C blood test.  Hepatitis B blood test.  Sexually transmitted disease (STD) testing.  Diabetes screening. This is done by checking your blood sugar (glucose) after you have not eaten for a while (fasting). You may have this done every 1-3 years.  Abdominal aortic aneurysm (AAA) screening. You may need this if you are a current or former smoker.  Osteoporosis. You may be screened starting at age 71 if you are at high risk. Talk with your health care provider about your test results, treatment options, and if necessary, the need for more tests. Vaccines Your health care provider may recommend certain vaccines, such as:  Influenza vaccine. This is recommended every year.  Tetanus, diphtheria, and acellular pertussis (Tdap, Td)  vaccine. You may need a Td booster every 10 years.  Varicella vaccine. You may need this if you have not been vaccinated.  Zoster vaccine. You may need this after age 27.  Measles, mumps, and rubella (MMR) vaccine. You may need at least one dose of MMR if you were born in 1957 or later. You may also need a second dose.  Pneumococcal 13-valent conjugate (PCV13) vaccine. One dose is recommended after age 66.  Pneumococcal polysaccharide (PPSV23) vaccine. One dose is recommended after age 63.  Meningococcal vaccine. You may need this if you have certain conditions.  Hepatitis A vaccine. You may need this if you have certain conditions or if you travel or work in places where you may be exposed to hepatitis A.  Hepatitis B vaccine. You may need this if you have certain conditions or if you travel or work in places where you may be exposed to hepatitis B.  Haemophilus influenzae type b (Hib) vaccine. You may need this if you have certain risk factors. Talk to your health care provider about which screenings and vaccines you need and how often you need them. This information is not intended to replace advice given to you by your health care provider. Make sure you discuss any questions you have with your health care provider. Document Released: 08/06/2015 Document Revised: 08/30/2017 Document Reviewed: 05/11/2015 Elsevier Interactive Patient Education  2019 [place], MD [facility]

## 2018-10-10 NOTE — Patient Instructions (Addendum)
-Nice seeing you today!  -Lab work today, will notify you with results.  -Schedule follow up in 6 months.   Preventive Care 83 Years and Older, Male Preventive care refers to lifestyle choices and visits with your health care provider that can promote health and wellness. What does preventive care include?   A yearly physical exam. This is also called an annual well check.  Dental exams once or twice a year.  Routine eye exams. Ask your health care provider how often you should have your eyes checked.  Personal lifestyle choices, including: ? Daily care of your teeth and gums. ? Regular physical activity. ? Eating a healthy diet. ? Avoiding tobacco and drug use. ? Limiting alcohol use. ? Practicing safe sex. ? Taking low doses of aspirin every day. ? Taking vitamin and mineral supplements as recommended by your health care provider. What happens during an annual well check? The services and screenings done by your health care provider during your annual well check will depend on your age, overall health, lifestyle risk factors, and family history of disease. Counseling Your health care provider may ask you questions about your:  Alcohol use.  Tobacco use.  Drug use.  Emotional well-being.  Home and relationship well-being.  Sexual activity.  Eating habits.  History of falls.  Memory and ability to understand (cognition).  Work and work Statistician. Screening You may have the following tests or measurements:  Height, weight, and BMI.  Blood pressure.  Lipid and cholesterol levels. These may be checked every 5 years, or more frequently if you are over 63 years old.  Skin check.  Lung cancer screening. You may have this screening every year starting at age 84 if you have a 30-pack-year history of smoking and currently smoke or have quit within the past 15 years.  Colorectal cancer screening. All adults should have this screening starting at age 63 and  continuing until age 80. You will have tests every 1-10 years, depending on your results and the type of screening test. People at increased risk should start screening at an earlier age. Screening tests may include: ? Guaiac-based fecal occult blood testing. ? Fecal immunochemical test (FIT). ? Stool DNA test. ? Virtual colonoscopy. ? Sigmoidoscopy. During this test, a flexible tube with a tiny camera (sigmoidoscope) is used to examine your rectum and lower colon. The sigmoidoscope is inserted through your anus into your rectum and lower colon. ? Colonoscopy. During this test, a long, thin, flexible tube with a tiny camera (colonoscope) is used to examine your entire colon and rectum.  Prostate cancer screening. Recommendations will vary depending on your family history and other risks.  Hepatitis C blood test.  Hepatitis B blood test.  Sexually transmitted disease (STD) testing.  Diabetes screening. This is done by checking your blood sugar (glucose) after you have not eaten for a while (fasting). You may have this done every 1-3 years.  Abdominal aortic aneurysm (AAA) screening. You may need this if you are a current or former smoker.  Osteoporosis. You may be screened starting at age 62 if you are at high risk. Talk with your health care provider about your test results, treatment options, and if necessary, the need for more tests. Vaccines Your health care provider may recommend certain vaccines, such as:  Influenza vaccine. This is recommended every year.  Tetanus, diphtheria, and acellular pertussis (Tdap, Td) vaccine. You may need a Td booster every 10 years.  Varicella vaccine. You may need this if  you have not been vaccinated.  Zoster vaccine. You may need this after age 70.  Measles, mumps, and rubella (MMR) vaccine. You may need at least one dose of MMR if you were born in 1957 or later. You may also need a second dose.  Pneumococcal 13-valent conjugate (PCV13) vaccine.  One dose is recommended after age 48.  Pneumococcal polysaccharide (PPSV23) vaccine. One dose is recommended after age 67.  Meningococcal vaccine. You may need this if you have certain conditions.  Hepatitis A vaccine. You may need this if you have certain conditions or if you travel or work in places where you may be exposed to hepatitis A.  Hepatitis B vaccine. You may need this if you have certain conditions or if you travel or work in places where you may be exposed to hepatitis B.  Haemophilus influenzae type b (Hib) vaccine. You may need this if you have certain risk factors. Talk to your health care provider about which screenings and vaccines you need and how often you need them. This information is not intended to replace advice given to you by your health care provider. Make sure you discuss any questions you have with your health care provider. Document Released: 08/06/2015 Document Revised: 08/30/2017 Document Reviewed: 05/11/2015 Elsevier Interactive Patient Education  2019 [employer].

## 2018-10-11 ENCOUNTER — Other Ambulatory Visit (INDEPENDENT_AMBULATORY_CARE_PROVIDER_SITE_OTHER): Payer: Medicare Other

## 2018-10-11 DIAGNOSIS — R7309 Other abnormal glucose: Secondary | ICD-10-CM | POA: Diagnosis not present

## 2018-10-11 LAB — HEMOGLOBIN A1C: Hgb A1c MFr Bld: 6.2 % (ref 4.6–6.5)

## 2018-10-11 NOTE — Addendum Note (Signed)
Addended by: Gwynne Edinger on: 10/11/2018 10:01 AM   Modules accepted: Orders

## 2018-10-23 ENCOUNTER — Telehealth: Payer: Self-pay | Admitting: *Deleted

## 2018-10-23 NOTE — Telephone Encounter (Signed)
I'm not sure how to help.  Can you help?

## 2018-10-23 NOTE — Telephone Encounter (Signed)
Spoke with wife and nothing else is needed at this time.

## 2018-10-23 NOTE — Telephone Encounter (Signed)
I'm honestly not sure. I would advise they call [facility] to ask what this means. I'm not very familiar with what [facility] really does

## 2018-10-23 NOTE — Telephone Encounter (Signed)
Copied from [place] [id number]. Topic: General - Inquiry >> Oct 23, 2018  9:45 AM Berneta Levins wrote: Reason for CRM:   Pt's wife called and left message on [place] 10/21/2018.  States that pt received a letter in the mail from [facility] stating they were partnering with doctor and insurance and she wants to know what this means for pt and his care.  Pt's wife can be reached at [phone].

## 2018-11-16 ENCOUNTER — Other Ambulatory Visit: Payer: Self-pay | Admitting: Internal Medicine

## 2018-12-02 ENCOUNTER — Telehealth: Payer: Self-pay | Admitting: Internal Medicine

## 2018-12-02 NOTE — Telephone Encounter (Signed)
Copied from [place] [id number]. Topic: Quick Communication - Rx Refill/Question >> Dec 02, 2018  4:10 PM Pauline Good wrote: Medication: Escitalopram 5mg    Has the patient contacted their pharmacy? yes (Agent: If no, request that the patient contact the pharmacy for the refill.) (Agent: If yes, when and what did the pharmacy advise?) call the office  Preferred Pharmacy (with phone number or street name[facility]  Agent: Please be advised that RX refills may take up to 3 business days. We ask that you follow-up with your pharmacy.

## 2018-12-03 MED ORDER — ESCITALOPRAM OXALATE 5 MG PO TABS: 5.0000 mg | ORAL_TABLET | Freq: Every day | ORAL | 1 refills | Status: DC

## 2018-12-09 ENCOUNTER — Other Ambulatory Visit: Payer: Self-pay | Admitting: *Deleted

## 2018-12-09 MED ORDER — TAMSULOSIN HCL 0.4 MG PO CAPS: 0.4000 mg | ORAL_CAPSULE | Freq: Every day | ORAL | 1 refills | Status: DC

## 2019-01-13 DIAGNOSIS — Z961 Presence of intraocular lens: Secondary | ICD-10-CM | POA: Diagnosis not present

## 2019-01-13 DIAGNOSIS — H04123 Dry eye syndrome of bilateral lacrimal glands: Secondary | ICD-10-CM | POA: Diagnosis not present

## 2019-01-16 ENCOUNTER — Other Ambulatory Visit: Payer: Self-pay | Admitting: Internal Medicine

## 2019-01-28 DIAGNOSIS — R351 Nocturia: Secondary | ICD-10-CM | POA: Diagnosis not present

## 2019-01-28 DIAGNOSIS — R3912 Poor urinary stream: Secondary | ICD-10-CM | POA: Diagnosis not present

## 2019-01-28 DIAGNOSIS — N401 Enlarged prostate with lower urinary tract symptoms: Secondary | ICD-10-CM | POA: Diagnosis not present

## 2019-02-10 ENCOUNTER — Other Ambulatory Visit: Payer: Self-pay | Admitting: Internal Medicine

## 2019-04-15 DIAGNOSIS — L738 Other specified follicular disorders: Secondary | ICD-10-CM | POA: Diagnosis not present

## 2019-04-15 DIAGNOSIS — L821 Other seborrheic keratosis: Secondary | ICD-10-CM | POA: Diagnosis not present

## 2019-04-15 DIAGNOSIS — L812 Freckles: Secondary | ICD-10-CM | POA: Diagnosis not present

## 2019-04-15 DIAGNOSIS — Z85828 Personal history of other malignant neoplasm of skin: Secondary | ICD-10-CM | POA: Diagnosis not present

## 2019-05-20 ENCOUNTER — Other Ambulatory Visit: Payer: Self-pay | Admitting: *Deleted

## 2019-05-20 MED ORDER — ESCITALOPRAM OXALATE 5 MG PO TABS: 5.0000 mg | ORAL_TABLET | Freq: Every day | ORAL | 0 refills | Status: DC

## 2019-06-16 ENCOUNTER — Other Ambulatory Visit: Payer: Self-pay | Admitting: Internal Medicine

## 2019-07-02 ENCOUNTER — Other Ambulatory Visit: Payer: Self-pay | Admitting: Internal Medicine

## 2019-07-10 ENCOUNTER — Other Ambulatory Visit: Payer: Self-pay | Admitting: Internal Medicine

## 2019-08-24 ENCOUNTER — Other Ambulatory Visit: Payer: Self-pay | Admitting: Internal Medicine

## 2019-09-03 DIAGNOSIS — T8484XA Pain due to internal orthopedic prosthetic devices, implants and grafts, initial encounter: Secondary | ICD-10-CM | POA: Diagnosis not present

## 2019-09-03 DIAGNOSIS — M25512 Pain in left shoulder: Secondary | ICD-10-CM | POA: Diagnosis not present

## 2019-09-11 ENCOUNTER — Other Ambulatory Visit: Payer: Self-pay | Admitting: Internal Medicine

## 2019-09-21 ENCOUNTER — Ambulatory Visit: Payer: Medicare Other | Attending: Internal Medicine

## 2019-09-21 DIAGNOSIS — Z23 Encounter for immunization: Secondary | ICD-10-CM

## 2019-09-21 NOTE — Progress Notes (Signed)
Covid-19 Vaccination Clinic  Name:  LAVONNE AMPARANO    MRN: [id number] DOB: August 13, 1929  09/21/2019  Mr. Welles was observed post Covid-19 immunization for 15 minutes without incidence. He was provided with Vaccine Information Sheet and instruction to access the V-Safe system.   Mr. Loeffelholz was instructed to call 911 with any severe reactions post vaccine: Marland Kitchen Difficulty breathing  . Swelling of your face and throat  . A fast heartbeat  . A bad rash all over your body  . Dizziness and weakness    Immunizations Administered    Name Date Dose VIS Date Route   Pfizer COVID-19 Vaccine 09/21/2019  4:15 PM 0.3 mL 07/04/2019 Intramuscular   Manufacturer: [employer], [employer]   Lot: [id number]   NDC: [address]-2

## 2019-09-24 ENCOUNTER — Other Ambulatory Visit: Payer: Self-pay | Admitting: Internal Medicine

## 2019-10-06 DIAGNOSIS — M19012 Primary osteoarthritis, left shoulder: Secondary | ICD-10-CM | POA: Diagnosis not present

## 2019-10-20 ENCOUNTER — Other Ambulatory Visit: Payer: Self-pay | Admitting: Internal Medicine

## 2019-10-21 ENCOUNTER — Other Ambulatory Visit: Payer: Self-pay

## 2019-10-21 ENCOUNTER — Encounter: Payer: Self-pay | Admitting: Internal Medicine

## 2019-10-21 ENCOUNTER — Ambulatory Visit (INDEPENDENT_AMBULATORY_CARE_PROVIDER_SITE_OTHER): Payer: Medicare Other | Admitting: Internal Medicine

## 2019-10-21 ENCOUNTER — Ambulatory Visit: Payer: Medicare Other | Attending: Internal Medicine

## 2019-10-21 VITALS — BP 120/80 | HR 68 | Temp 97.5°F | Ht 69.0 in | Wt 212.9 lb

## 2019-10-21 DIAGNOSIS — E669 Obesity, unspecified: Secondary | ICD-10-CM | POA: Diagnosis not present

## 2019-10-21 DIAGNOSIS — K219 Gastro-esophageal reflux disease without esophagitis: Secondary | ICD-10-CM

## 2019-10-21 DIAGNOSIS — F411 Generalized anxiety disorder: Secondary | ICD-10-CM | POA: Diagnosis not present

## 2019-10-21 DIAGNOSIS — E782 Mixed hyperlipidemia: Secondary | ICD-10-CM

## 2019-10-21 DIAGNOSIS — E538 Deficiency of other specified B group vitamins: Secondary | ICD-10-CM | POA: Diagnosis not present

## 2019-10-21 DIAGNOSIS — I10 Essential (primary) hypertension: Secondary | ICD-10-CM

## 2019-10-21 DIAGNOSIS — Z23 Encounter for immunization: Secondary | ICD-10-CM

## 2019-10-21 DIAGNOSIS — Z0000 Encounter for general adult medical examination without abnormal findings: Secondary | ICD-10-CM

## 2019-10-21 NOTE — Patient Instructions (Signed)
-Nice seeing you today!!  -Lab work today; will notify you once results are available.  -Wait 6 weeks (mid May or so) after your last COVID vaccine to get your tetanus booster.  -Schedule a 6 month follow up.   Preventive Care 3 Years and Older, Male Preventive care refers to lifestyle choices and visits with your health care provider that can promote health and wellness. This includes:  A yearly physical exam. This is also called an annual well check.  Regular dental and eye exams.  Immunizations.  Screening for certain conditions.  Healthy lifestyle choices, such as diet and exercise. What can I expect for my preventive care visit? Physical exam Your health care provider will check:  Height and weight. These may be used to calculate body mass index (BMI), which is a measurement that tells if you are at a healthy weight.  Heart rate and blood pressure.  Your skin for abnormal spots. Counseling Your health care provider may ask you questions about:  Alcohol, tobacco, and drug use.  Emotional well-being.  Home and relationship well-being.  Sexual activity.  Eating habits.  History of falls.  Memory and ability to understand (cognition).  Work and work Statistician. What immunizations do I need?  Influenza (flu) vaccine  This is recommended every year. Tetanus, diphtheria, and pertussis (Tdap) vaccine  You may need a Td booster every 10 years. Varicella (chickenpox) vaccine  You may need this vaccine if you have not already been vaccinated. Zoster (shingles) vaccine  You may need this after age 62. Pneumococcal conjugate (PCV13) vaccine  One dose is recommended after age 27. Pneumococcal polysaccharide (PPSV23) vaccine  One dose is recommended after age 61. Measles, mumps, and rubella (MMR) vaccine  You may need at least one dose of MMR if you were born in 1957 or later. You may also need a second dose. Meningococcal conjugate (MenACWY)  vaccine  You may need this if you have certain conditions. Hepatitis A vaccine  You may need this if you have certain conditions or if you travel or work in places where you may be exposed to hepatitis A. Hepatitis B vaccine  You may need this if you have certain conditions or if you travel or work in places where you may be exposed to hepatitis B. Haemophilus influenzae type b (Hib) vaccine  You may need this if you have certain conditions. You may receive vaccines as individual doses or as more than one vaccine together in one shot (combination vaccines). Talk with your health care provider about the risks and benefits of combination vaccines. What tests do I need? Blood tests  Lipid and cholesterol levels. These may be checked every 5 years, or more frequently depending on your overall health.  Hepatitis C test.  Hepatitis B test. Screening  Lung cancer screening. You may have this screening every year starting at age 48 if you have a 30-pack-year history of smoking and currently smoke or have quit within the past 15 years.  Colorectal cancer screening. All adults should have this screening starting at age 50 and continuing until age 8. Your health care provider may recommend screening at age 59 if you are at increased risk. You will have tests every 1-10 years, depending on your results and the type of screening test.  Prostate cancer screening. Recommendations will vary depending on your family history and other risks.  Diabetes screening. This is done by checking your blood sugar (glucose) after you have not eaten for a while (fasting).  You may have this done every 1-3 years.  Abdominal aortic aneurysm (AAA) screening. You may need this if you are a current or former smoker.  Sexually transmitted disease (STD) testing. Follow these instructions at home: Eating and drinking  Eat a diet that includes fresh fruits and vegetables, whole grains, lean protein, and low-fat dairy  products. Limit your intake of foods with high amounts of sugar, saturated fats, and salt.  Take vitamin and mineral supplements as recommended by your health care provider.  Do not drink alcohol if your health care provider tells you not to drink.  If you drink alcohol: ? Limit how much you have to 0-2 drinks a day. ? Be aware of how much alcohol is in your drink. In the [place] one drink equals one 12 oz bottle of beer (355 mL), one 5 oz glass of wine (148 mL), or one 1 oz glass of hard liquor (44 mL). Lifestyle  Take daily care of your teeth and gums.  Stay active. Exercise for at least 30 minutes on 5 or more days each week.  Do not use any products that contain nicotine or tobacco, such as cigarettes, e-cigarettes, and chewing tobacco. If you need help quitting, ask your health care provider.  If you are sexually active, practice safe sex. Use a condom or other form of protection to prevent STIs (sexually transmitted infections).  Talk with your health care provider about taking a low-dose aspirin or statin. What's next?  Visit your health care provider once a year for a well check visit.  Ask your health care provider how often you should have your eyes and teeth checked.  Stay up to date on all vaccines. This information is not intended to replace advice given to you by your health care provider. Make sure you discuss any questions you have with your health care provider. Document Revised: 07/04/2018 Document Reviewed: 07/04/2018 Elsevier Patient Education  2020 [employer].

## 2019-10-21 NOTE — Progress Notes (Signed)
Covid-19 Vaccination Clinic  Name:  Joshua Lopez    MRN: [id number] DOB: 1929/09/17  10/21/2019  Joshua Lopez was observed post Covid-19 immunization for 15 minutes without incident. He was provided with Vaccine Information Sheet and instruction to access the V-Safe system.   Joshua Lopez was instructed to call 911 with any severe reactions post vaccine: Marland Kitchen Difficulty breathing  . Swelling of face and throat  . A fast heartbeat  . A bad rash all over body  . Dizziness and weakness   Immunizations Administered    Name Date Dose VIS Date Route   Pfizer COVID-19 Vaccine 10/21/2019  4:56 PM 0.3 mL 07/04/2019 Intramuscular   Manufacturer: [employer], [employer]   Lot: [id number]   NDC: [address]-6

## 2019-10-21 NOTE — Progress Notes (Signed)
Established Patient Office Visit     This visit occurred during the SARS-CoV-2 public health emergency.  Safety protocols were in place, including screening questions prior to the visit, additional usage of staff PPE, and extensive cleaning of exam room while observing appropriate contact time as indicated for disinfecting solutions.    CC/Reason for Visit: Subsequent Medicare wellness visit and follow-up chronic medical conditions  HPI: Joshua Lopez is a 86 y.o. male who is coming in today for the above mentioned reasons. Past Medical History is significant for: GERD on daily PPI therapy, well-controlled hypertension, hyperlipidemia, generalized anxiety disorder on Lexapro, BPH with nocturia.  He has done well since I last saw him about a year ago.  He has no acute complaints today.  He is still having significant issues with hearing but is not interested in hearing aids.  He has routine eye and dental care.  He is very physically active still.  He is getting his second Covid vaccine today, he has completed shingles series, is due for tetanus booster.  He has elected to no longer pursue cancer screenings due to age.   Past Medical/Surgical History: Past Medical History:  Diagnosis Date  . Arthritis    diffuse  . BPH (benign prostatic hypertrophy)    Dr. Risa Grill monitors  elevated PSA  . Chronic lower back pain 05-10-12   States pain is related to hip  . Colon polyp    2006 and 2010  . Esophageal stricture   . GERD (gastroesophageal reflux disease)   . Hyperlipidemia    NMR  2007  . Hypertension   . Subclavian steal syndrome 1997    Past Surgical History:  Procedure Laterality Date  . BACK SURGERY  05-10-12   lumbar fusion with hardware  . CATARACT EXTRACTION W/ INTRAOCULAR LENS  IMPLANT, BILATERAL    . COLONOSCOPY W/ POLYPECTOMY  2006 & 2010  . ESOPHAGEAL DILATION  2003  . GANGLION CYST EXCISION  1999   LUE  . GANGLION CYST EXCISION  05/14/2012   Procedure: REMOVAL  GANGLION OF WRIST;  Surgeon: Mcarthur Rossetti, MD;  Location: [facility];  Service: Orthopedics;  Laterality: Right;  Excision Right Wrist Ganglion Cyst  . JOINT REPLACEMENT    . KNEE ARTHROSCOPY  2001   left  . POSTERIOR FUSION LUMBAR SPINE  02/08/12   L2-L4  . PROSTATE BIOPSY  2000   Dr. Rosana Hoes  . [place]   right side  . TOTAL HIP ARTHROPLASTY  05/14/2012   Procedure: TOTAL HIP ARTHROPLASTY ANTERIOR APPROACH;  Surgeon: Mcarthur Rossetti, MD;  Location: [facility];  Service: Orthopedics;  Laterality: Right;  Right Total Hip Arthroplasty, Anterior Approach (C-Arm)  . TOTAL SHOULDER ARTHROPLASTY  2010   Dr. Mardelle Matte; right    Social History:  reports that he quit smoking about 46 years ago. His smoking use included cigarettes. He has a 16.50 pack-year smoking history. He quit smokeless tobacco use about 45 years ago.  His smokeless tobacco use included chew. He reports that he does not drink alcohol or use drugs.  Allergies: Allergies  Allergen Reactions  . Aspirin Rash and Other (See Comments)    Dyspnea, eyelid edema, truncal urticaria--Patient tolerates other NSAIDS at home Because of a history of documented adverse serious drug reaction;Medi Alert bracelet  is recommended Other reaction(s): Other (See Comments) Dyspnea, eyelid edema, truncal urticaria--Patient tolerates other NSAIDS at home Because of a history of documented adverse serious drug reaction;Medi Alert  braceletis recommended    Family History:  Family History  Problem Relation Age of Onset  . Sudden death Father        murdered  . Hypertension Mother   . Skin cancer Mother   . Skin cancer Brother   . Hypertension Brother   . Coronary artery disease Brother        CABG  . Colon cancer Paternal Uncle   . Breast cancer Paternal Grandmother   . Breast cancer Paternal Aunt   . Colon cancer Paternal Aunt   . Heart attack Paternal Uncle   . Kidney disease Neg Hx   . Diabetes Neg Hx       Current Outpatient Medications:  .  acetaminophen (TYLENOL) 500 MG tablet, Take 500 mg by mouth every 6 (six) hours as needed. For pain, Disp: , Rfl:  .  albuterol (PROVENTIL HFA;VENTOLIN HFA) 108 (90 Base) MCG/ACT inhaler, Inhale 2 puffs into the lungs every 6 (six) hours as needed for wheezing or shortness of breath., Disp: 1 Inhaler, Rfl: 0 .  Calcium Carbonate-Vitamin D (CALTRATE 600+D PO), Take 1 tablet by mouth 2 (two) times daily. , Disp: , Rfl:  .  escitalopram (LEXAPRO) 5 MG tablet, TAKE 1 TABLET EVERY DAY, Disp: 90 tablet, Rfl: 0 .  losartan (COZAAR) 25 MG tablet, TAKE 1 TABLET EVERY DAY. NEED MD APPOINTMENT FOR REFILLS, Disp: 90 tablet, Rfl: 0 .  montelukast (SINGULAIR) 10 MG tablet, TAKE 1 TABLET AT BEDTIME, Disp: 30 tablet, Rfl: 0 .  pantoprazole (PROTONIX) 40 MG tablet, TAKE 1 TABLET EVERY DAY, Disp: 30 tablet, Rfl: 0 .  polyethylene glycol (MIRALAX / GLYCOLAX) packet, Take 17 g by mouth 2 (two) times daily. , Disp: , Rfl:  .  pravastatin (PRAVACHOL) 20 MG tablet, TAKE 1 TABLET AT BEDTIME (PLEASE SCHEDULE PHYSICAL FOR MORE REFILLS), Disp: 30 tablet, Rfl: 0 .  tamsulosin (FLOMAX) 0.4 MG CAPS capsule, Take 1 capsule (0.4 mg total) by mouth at bedtime., Disp: 90 capsule, Rfl: 1  Review of Systems:  Constitutional: Denies fever, chills, diaphoresis, appetite change and fatigue.  HEENT: Denies photophobia, eye pain, redness, hearing loss, ear pain, congestion, sore throat, rhinorrhea, sneezing, mouth sores, trouble swallowing, neck pain, neck stiffness and tinnitus.   Respiratory: Denies SOB, DOE, cough, chest tightness,  and wheezing.   Cardiovascular: Denies chest pain, palpitations and leg swelling.  Gastrointestinal: Denies nausea, vomiting, abdominal pain, diarrhea, constipation, blood in stool and abdominal distention.  Genitourinary: Denies dysuria, urgency, frequency, hematuria, flank pain and difficulty urinating.  Endocrine: Denies: hot or cold intolerance, sweats, changes  in hair or nails, polyuria, polydipsia. Musculoskeletal: Denies myalgias, back pain, joint swelling, arthralgias and gait problem.  Skin: Denies pallor, rash and wound.  Neurological: Denies dizziness, seizures, syncope, weakness, light-headedness, numbness and headaches.  Hematological: Denies adenopathy. Easy bruising, personal or family bleeding history  Psychiatric/Behavioral: Denies suicidal ideation, mood changes, confusion, nervousness, sleep disturbance and agitation    Physical Exam: Vitals:   10/21/19 0929  BP: 120/80  Pulse: 68  Temp: (!) 97.5 F (36.4 C)  TempSrc: Temporal  SpO2: 97%  Weight: 212 lb 14.4 oz (96.6 kg)  Height: '5\' 9"'  (1.753 m)    Body mass index is 31.44 kg/m.   Constitutional: NAD, calm, comfortable Eyes: PERRL, lids and conjunctivae normal ENMT: Mucous membranes are moist. Tympanic membrane is pearly white, no erythema or bulging. Neck: normal, supple, no masses, no thyromegaly Respiratory: clear to auscultation bilaterally, no wheezing, no crackles. Normal respiratory effort. No accessory  muscle use.  Cardiovascular: Regular rate and rhythm, no murmurs / rubs / gallops. No extremity edema. 2+ pedal pulses. No carotid bruits.  Abdomen: no tenderness, no masses palpated. No hepatosplenomegaly. Bowel sounds positive.  Musculoskeletal: no clubbing / cyanosis. No joint deformity upper and lower extremities. Good ROM, no contractures. Normal muscle tone.  Skin: no rashes, lesions, ulcers. No induration Neurologic: CN 2-12 grossly intact. Sensation intact, DTR normal. Strength 5/5 in all 4.  Psychiatric: Normal judgment and insight. Alert and oriented x 3. Normal mood.   Subsequent Medicare wellness visit   1. Risk factors, based on past  M,S,F -cardiovascular disease risk factors include age, gender, history of hypertension, history of hyperlipidemia   2.  Physical activities: Very active still with housework, yard work, daily walks   3.   Depression/mood:  Stable, not depressed   4.  Hearing:  Significant hearing impairment, not interested in hearing aids   5.  ADL's: Independent in all ADLs   6.  Fall risk:  Low fall risk   7.  Home safety: No problems identified   8.  Height weight, and visual acuity: Height and weight as above, visual acuity is 20/50 with the right eye, 20/40 with the left eye and 20/32 with both eyes together   9.  Counseling:  Counseled on importance of hearing aids, healthy lifestyle   10. Lab orders based on risk factors: Laboratory update will be reviewed   11. Referral :  None today   12. Care plan:  Follow-up in 6 months   13. Cognitive assessment:  No cognitive impairment   14. Screening: Patient provided with a written and personalized 5-10 year screening schedule in the AVS.   yes   15. Provider List Update:   PCP only  16. Advance Directives: Full code     Office Visit from 10/21/2019 in [facility] [place]  PHQ-9 Total Score  0      Fall Risk  10/21/2019 10/10/2018 07/11/2018 02/12/2017 09/27/2015  Falls in the past year? 0 0 0 No No  Comment - - - Emmi Telephone Survey: data to providers prior to load -  Number falls in past yr: 0 0 0 - -  Injury with Fall? 0 0 0 - -     Impression and Plan:  Encounter for preventive health examination -He has routine eye and dental care. -Patient receiving his second Covid vaccine today, is due for Tdap booster, otherwise immunizations are up-to-date and age-appropriate. -Screening labs today. -Healthy lifestyle has been discussed in detail. -He has elected to defer further cancer screening due to age, I agree  GAD (generalized anxiety disorder)  -Mood is stable on Lexapro.  Essential hypertension  -Well-controlled on current regimen.  HYPERLIPIDEMIA  -Check lipids today, continue pravastatin. -Last LDL was 71 in March 20.  Gastroesophageal reflux disease without esophagitis -Well-controlled on daily PPI therapy.   Obesity (BMI 30.0-34.9) -Discussed healthy lifestyle, including increased physical activity and better food choices to promote weight loss.    Patient Instructions  -Nice seeing you today!!  -Lab work today; will notify you once results are available.  -Wait 6 weeks (mid May or so) after your last COVID vaccine to get your tetanus booster.  -Schedule a 6 month follow up.   Preventive Care 75 Years and Older, Male Preventive care refers to lifestyle choices and visits with your health care provider that can promote health and wellness. This includes:  A yearly physical exam. This is also called  an annual well check.  Regular dental and eye exams.  Immunizations.  Screening for certain conditions.  Healthy lifestyle choices, such as diet and exercise. What can I expect for my preventive care visit? Physical exam Your health care provider will check:  Height and weight. These may be used to calculate body mass index (BMI), which is a measurement that tells if you are at a healthy weight.  Heart rate and blood pressure.  Your skin for abnormal spots. Counseling Your health care provider may ask you questions about:  Alcohol, tobacco, and drug use.  Emotional well-being.  Home and relationship well-being.  Sexual activity.  Eating habits.  History of falls.  Memory and ability to understand (cognition).  Work and work Statistician. What immunizations do I need?  Influenza (flu) vaccine  This is recommended every year. Tetanus, diphtheria, and pertussis (Tdap) vaccine  You may need a Td booster every 10 years. Varicella (chickenpox) vaccine  You may need this vaccine if you have not already been vaccinated. Zoster (shingles) vaccine  You may need this after age 44. Pneumococcal conjugate (PCV13) vaccine  One dose is recommended after age 67. Pneumococcal polysaccharide (PPSV23) vaccine  One dose is recommended after age 9. Measles, mumps, and  rubella (MMR) vaccine  You may need at least one dose of MMR if you were born in 1957 or later. You may also need a second dose. Meningococcal conjugate (MenACWY) vaccine  You may need this if you have certain conditions. Hepatitis A vaccine  You may need this if you have certain conditions or if you travel or work in places where you may be exposed to hepatitis A. Hepatitis B vaccine  You may need this if you have certain conditions or if you travel or work in places where you may be exposed to hepatitis B. Haemophilus influenzae type b (Hib) vaccine  You may need this if you have certain conditions. You may receive vaccines as individual doses or as more than one vaccine together in one shot (combination vaccines). Talk with your health care provider about the risks and benefits of combination vaccines. What tests do I need? Blood tests  Lipid and cholesterol levels. These may be checked every 5 years, or more frequently depending on your overall health.  Hepatitis C test.  Hepatitis B test. Screening  Lung cancer screening. You may have this screening every year starting at age 15 if you have a 30-pack-year history of smoking and currently smoke or have quit within the past 15 years.  Colorectal cancer screening. All adults should have this screening starting at age 65 and continuing until age 37. Your health care provider may recommend screening at age 58 if you are at increased risk. You will have tests every 1-10 years, depending on your results and the type of screening test.  Prostate cancer screening. Recommendations will vary depending on your family history and other risks.  Diabetes screening. This is done by checking your blood sugar (glucose) after you have not eaten for a while (fasting). You may have this done every 1-3 years.  Abdominal aortic aneurysm (AAA) screening. You may need this if you are a current or former smoker.  Sexually transmitted disease (STD)  testing. Follow these instructions at home: Eating and drinking  Eat a diet that includes fresh fruits and vegetables, whole grains, lean protein, and low-fat dairy products. Limit your intake of foods with high amounts of sugar, saturated fats, and salt.  Take vitamin and mineral supplements  as recommended by your health care provider.  Do not drink alcohol if your health care provider tells you not to drink.  If you drink alcohol: ? Limit how much you have to 0-2 drinks a day. ? Be aware of how much alcohol is in your drink. In the [place] one drink equals one 12 oz bottle of beer (355 mL), one 5 oz glass of wine (148 mL), or one 1 oz glass of hard liquor (44 mL). Lifestyle  Take daily care of your teeth and gums.  Stay active. Exercise for at least 30 minutes on 5 or more days each week.  Do not use any products that contain nicotine or tobacco, such as cigarettes, e-cigarettes, and chewing tobacco. If you need help quitting, ask your health care provider.  If you are sexually active, practice safe sex. Use a condom or other form of protection to prevent STIs (sexually transmitted infections).  Talk with your health care provider about taking a low-dose aspirin or statin. What's next?  Visit your health care provider once a year for a well check visit.  Ask your health care provider how often you should have your eyes and teeth checked.  Stay up to date on all vaccines. This information is not intended to replace advice given to you by your health care provider. Make sure you discuss any questions you have with your health care provider. Document Revised: 07/04/2018 Document Reviewed: 07/04/2018 Elsevier Patient Education  2020 [place], MD [facility]

## 2019-10-22 ENCOUNTER — Other Ambulatory Visit (INDEPENDENT_AMBULATORY_CARE_PROVIDER_SITE_OTHER): Payer: Medicare Other

## 2019-10-22 DIAGNOSIS — I10 Essential (primary) hypertension: Secondary | ICD-10-CM

## 2019-10-22 DIAGNOSIS — E538 Deficiency of other specified B group vitamins: Secondary | ICD-10-CM | POA: Diagnosis not present

## 2019-10-22 DIAGNOSIS — F411 Generalized anxiety disorder: Secondary | ICD-10-CM

## 2019-10-22 DIAGNOSIS — M19012 Primary osteoarthritis, left shoulder: Secondary | ICD-10-CM | POA: Diagnosis not present

## 2019-10-22 DIAGNOSIS — E782 Mixed hyperlipidemia: Secondary | ICD-10-CM | POA: Diagnosis not present

## 2019-10-22 LAB — CBC WITH DIFFERENTIAL/PLATELET
Basophils Absolute: 0 10*3/uL (ref 0.0–0.1)
Basophils Relative: 0.6 % (ref 0.0–3.0)
Eosinophils Absolute: 0.2 10*3/uL (ref 0.0–0.7)
Eosinophils Relative: 4.4 % (ref 0.0–5.0)
HCT: 40.1 % (ref 39.0–52.0)
Hemoglobin: 13.6 g/dL (ref 13.0–17.0)
Lymphocytes Relative: 25.1 % (ref 12.0–46.0)
Lymphs Abs: 1.4 10*3/uL (ref 0.7–4.0)
MCHC: 33.9 g/dL (ref 30.0–36.0)
MCV: 94 fl (ref 78.0–100.0)
Monocytes Absolute: 0.3 10*3/uL (ref 0.1–1.0)
Monocytes Relative: 6.1 % (ref 3.0–12.0)
Neutro Abs: 3.6 10*3/uL (ref 1.4–7.7)
Neutrophils Relative %: 63.8 % (ref 43.0–77.0)
Platelets: 148 10*3/uL — ABNORMAL LOW (ref 150.0–400.0)
RBC: 4.26 Mil/uL (ref 4.22–5.81)
RDW: 14 % (ref 11.5–15.5)
WBC: 5.6 10*3/uL (ref 4.0–10.5)

## 2019-10-22 LAB — LIPID PANEL
Cholesterol: 134 mg/dL (ref 0–200)
HDL: 48.3 mg/dL (ref >39.00)
LDL Cholesterol: 64 mg/dL (ref 0–99)
NonHDL: 85.8
Total CHOL/HDL Ratio: 3
Triglycerides: 107 mg/dL (ref 0.0–149.0)
VLDL: 21.4 mg/dL (ref 0.0–40.0)

## 2019-10-22 LAB — COMPREHENSIVE METABOLIC PANEL
ALT: 10 U/L (ref 0–53)
AST: 11 U/L (ref 0–37)
Albumin: 4 g/dL (ref 3.5–5.2)
Alkaline Phosphatase: 73 U/L (ref 39–117)
BUN: 25 mg/dL — ABNORMAL HIGH (ref 6–23)
CO2: 27 mEq/L (ref 19–32)
Calcium: 8.7 mg/dL (ref 8.4–10.5)
Chloride: 105 mEq/L (ref 96–112)
Creatinine, Ser: 1.17 mg/dL (ref 0.40–1.50)
GFR: 58.9 mL/min — ABNORMAL LOW (ref >60.00)
Glucose, Bld: 114 mg/dL — ABNORMAL HIGH (ref 70–99)
Potassium: 4.4 mEq/L (ref 3.5–5.1)
Sodium: 138 mEq/L (ref 135–145)
Total Bilirubin: 0.8 mg/dL (ref 0.2–1.2)
Total Protein: 5.9 g/dL — ABNORMAL LOW (ref 6.0–8.3)

## 2019-10-22 LAB — VITAMIN B12: Vitamin B-12: 217 pg/mL (ref 211–911)

## 2019-10-22 LAB — TSH: TSH: 3.76 u[IU]/mL (ref 0.35–4.50)

## 2019-11-14 ENCOUNTER — Telehealth: Payer: Self-pay | Admitting: Internal Medicine

## 2019-11-14 ENCOUNTER — Other Ambulatory Visit: Payer: Self-pay | Admitting: Internal Medicine

## 2019-11-14 MED ORDER — PANTOPRAZOLE SODIUM 40 MG PO TBEC: 40.0000 mg | DELAYED_RELEASE_TABLET | Freq: Every day | ORAL | 1 refills | Status: DC

## 2019-11-14 MED ORDER — MONTELUKAST SODIUM 10 MG PO TABS: 10.0000 mg | ORAL_TABLET | Freq: Every day | ORAL | 1 refills | Status: DC

## 2019-11-14 MED ORDER — PRAVASTATIN SODIUM 20 MG PO TABS: ORAL_TABLET | ORAL | 1 refills | Status: DC

## 2019-11-14 NOTE — Telephone Encounter (Signed)
Refill sent.

## 2019-11-14 NOTE — Telephone Encounter (Signed)
Medication:Montelukast, Pantoprazole, Pravastatin  Pharmacy: [facility]   Pt wife is requesting 90 day supply for all pt medications.

## 2019-12-03 ENCOUNTER — Other Ambulatory Visit: Payer: Self-pay | Admitting: Internal Medicine

## 2019-12-23 ENCOUNTER — Encounter: Payer: Self-pay | Admitting: Internal Medicine

## 2019-12-23 ENCOUNTER — Other Ambulatory Visit: Payer: Self-pay

## 2019-12-23 ENCOUNTER — Ambulatory Visit (INDEPENDENT_AMBULATORY_CARE_PROVIDER_SITE_OTHER): Payer: Medicare Other | Admitting: Internal Medicine

## 2019-12-23 VITALS — BP 120/80 | HR 101 | Temp 97.1°F | Wt 210.5 lb

## 2019-12-23 DIAGNOSIS — H6123 Impacted cerumen, bilateral: Secondary | ICD-10-CM

## 2019-12-23 NOTE — Progress Notes (Signed)
Established Patient Office Visit     This visit occurred during the SARS-CoV-2 public health emergency.  Safety protocols were in place, including screening questions prior to the visit, additional usage of staff PPE, and extensive cleaning of exam room while observing appropriate contact time as indicated for disinfecting solutions.    CC/Reason for Visit: "I need my ears cleaned out"  HPI: Joshua Lopez is a 86 y.o. male who is coming in today for the above mentioned reasons.  He is here today with his wife.  They state that he has had been having progressive difficulty hearing.  This usually happens when he has excessive cerumen.  He denies pain or any URI symptoms.  No fever.   Past Medical/Surgical History: Past Medical History:  Diagnosis Date  . Arthritis    diffuse  . BPH (benign prostatic hypertrophy)    Dr. Risa Grill monitors  elevated PSA  . Chronic lower back pain 05-10-12   States pain is related to hip  . Colon polyp    2006 and 2010  . Esophageal stricture   . GERD (gastroesophageal reflux disease)   . Hyperlipidemia    NMR  2007  . Hypertension   . Subclavian steal syndrome 1997    Past Surgical History:  Procedure Laterality Date  . BACK SURGERY  05-10-12   lumbar fusion with hardware  . CATARACT EXTRACTION W/ INTRAOCULAR LENS  IMPLANT, BILATERAL    . COLONOSCOPY W/ POLYPECTOMY  2006 & 2010  . ESOPHAGEAL DILATION  2003  . GANGLION CYST EXCISION  1999   LUE  . GANGLION CYST EXCISION  05/14/2012   Procedure: REMOVAL GANGLION OF WRIST;  Surgeon: Mcarthur Rossetti, MD;  Location: [facility];  Service: Orthopedics;  Laterality: Right;  Excision Right Wrist Ganglion Cyst  . JOINT REPLACEMENT    . KNEE ARTHROSCOPY  2001   left  . POSTERIOR FUSION LUMBAR SPINE  02/08/12   L2-L4  . PROSTATE BIOPSY  2000   Dr. Rosana Hoes  . [place]   right side  . TOTAL HIP ARTHROPLASTY  05/14/2012   Procedure: TOTAL HIP ARTHROPLASTY ANTERIOR  APPROACH;  Surgeon: Mcarthur Rossetti, MD;  Location: [facility];  Service: Orthopedics;  Laterality: Right;  Right Total Hip Arthroplasty, Anterior Approach (C-Arm)  . TOTAL SHOULDER ARTHROPLASTY  2010   Dr. Mardelle Matte; right    Social History:  reports that he quit smoking about 46 years ago. His smoking use included cigarettes. He has a 16.50 pack-year smoking history. He quit smokeless tobacco use about 45 years ago.  His smokeless tobacco use included chew. He reports that he does not drink alcohol or use drugs.  Allergies: Allergies  Allergen Reactions  . Aspirin Rash and Other (See Comments)    Dyspnea, eyelid edema, truncal urticaria--Patient tolerates other NSAIDS at home Because of a history of documented adverse serious drug reaction;Medi Alert bracelet  is recommended Other reaction(s): Other (See Comments) Dyspnea, eyelid edema, truncal urticaria--Patient tolerates other NSAIDS at home Because of a history of documented adverse serious drug reaction;Medi Alert braceletis recommended    Family History:  Family History  Problem Relation Age of Onset  . Sudden death Father        murdered  . Hypertension Mother   . Skin cancer Mother   . Skin cancer Brother   . Hypertension Brother   . Coronary artery disease Brother        CABG  . Colon cancer  Paternal Uncle   . Breast cancer Paternal Grandmother   . Breast cancer Paternal Aunt   . Colon cancer Paternal Aunt   . Heart attack Paternal Uncle   . Kidney disease Neg Hx   . Diabetes Neg Hx      Current Outpatient Medications:  .  acetaminophen (TYLENOL) 500 MG tablet, Take 500 mg by mouth every 6 (six) hours as needed. For pain, Disp: , Rfl:  .  albuterol (PROVENTIL HFA;VENTOLIN HFA) 108 (90 Base) MCG/ACT inhaler, Inhale 2 puffs into the lungs every 6 (six) hours as needed for wheezing or shortness of breath., Disp: 1 Inhaler, Rfl: 0 .  Calcium Carbonate-Vitamin D (CALTRATE 600+D PO), Take 1 tablet by mouth 2 (two)  times daily. , Disp: , Rfl:  .  escitalopram (LEXAPRO) 5 MG tablet, TAKE 1 TABLET EVERY DAY, Disp: 90 tablet, Rfl: 1 .  losartan (COZAAR) 25 MG tablet, TAKE 1 TABLET EVERY DAY (NEED MD APPOINTMENT), Disp: 90 tablet, Rfl: 1 .  montelukast (SINGULAIR) 10 MG tablet, Take 1 tablet (10 mg total) by mouth at bedtime., Disp: 90 tablet, Rfl: 1 .  pantoprazole (PROTONIX) 40 MG tablet, Take 1 tablet (40 mg total) by mouth daily., Disp: 90 tablet, Rfl: 1 .  polyethylene glycol (MIRALAX / GLYCOLAX) packet, Take 17 g by mouth 2 (two) times daily. , Disp: , Rfl:  .  pravastatin (PRAVACHOL) 20 MG tablet, TAKE 1 TABLET AT BEDTIME, Disp: 90 tablet, Rfl: 1 .  tamsulosin (FLOMAX) 0.4 MG CAPS capsule, Take 1 capsule (0.4 mg total) by mouth at bedtime., Disp: 90 capsule, Rfl: 1  Review of Systems:  Constitutional: Denies fever, chills, diaphoresis, appetite change and fatigue.  HEENT: Denies photophobia, eye pain, redness, ear pain, congestion, sore throat, rhinorrhea, sneezing, mouth sores, trouble swallowing, neck pain, neck stiffness and tinnitus.   Respiratory: Denies SOB, DOE, cough, chest tightness,  and wheezing.   Cardiovascular: Denies chest pain, palpitations and leg swelling.  Gastrointestinal: Denies nausea, vomiting, abdominal pain, diarrhea, constipation, blood in stool and abdominal distention.  Genitourinary: Denies dysuria, urgency, frequency, hematuria, flank pain and difficulty urinating.  Endocrine: Denies: hot or cold intolerance, sweats, changes in hair or nails, polyuria, polydipsia. Musculoskeletal: Denies myalgias, back pain, joint swelling, arthralgias and gait problem.  Skin: Denies pallor, rash and wound.  Neurological: Denies dizziness, seizures, syncope, weakness, light-headedness, numbness and headaches.  Hematological: Denies adenopathy. Easy bruising, personal or family bleeding history  Psychiatric/Behavioral: Denies suicidal ideation, mood changes, confusion, nervousness, sleep  disturbance and agitation    Physical Exam: Vitals:   12/23/19 1000  BP: 120/80  Pulse: (!) 101  Temp: (!) 97.1 F (36.2 C)  TempSrc: Temporal  SpO2: 95%  Weight: 210 lb 8 oz (95.5 kg)    Body mass index is 31.09 kg/m.   Constitutional: NAD, calm, comfortable Eyes: PERRL, lids and conjunctivae normal ENMT: Mucous membranes are moist.  Tympanic membrane is obstructed by cerumen bilaterally.   Neurologic: Grossly intact and nonfocal Psychiatric: Normal judgment and insight. Alert and oriented x 3. Normal mood.    Impression and Plan:  Bilateral impacted cerumen  -Cerumen Desimpaction  After patient consent was obtained, warm water was applied and gentle ear lavage performed on bilateral ears. There were no complications and following the desimpaction the tympanic membranes were visible. Tympanic membranes are intact following the procedure. Auditory canals are normal. The patient reported relief of symptoms after removal of cerumen.      Lelon Frohlich, MD [facility]

## 2020-04-21 ENCOUNTER — Other Ambulatory Visit: Payer: Self-pay | Admitting: Internal Medicine

## 2020-04-22 DIAGNOSIS — L821 Other seborrheic keratosis: Secondary | ICD-10-CM | POA: Diagnosis not present

## 2020-04-22 DIAGNOSIS — L812 Freckles: Secondary | ICD-10-CM | POA: Diagnosis not present

## 2020-04-22 DIAGNOSIS — D1801 Hemangioma of skin and subcutaneous tissue: Secondary | ICD-10-CM | POA: Diagnosis not present

## 2020-04-22 DIAGNOSIS — Z85828 Personal history of other malignant neoplasm of skin: Secondary | ICD-10-CM | POA: Diagnosis not present

## 2020-04-27 ENCOUNTER — Encounter: Payer: Self-pay | Admitting: Internal Medicine

## 2020-04-27 ENCOUNTER — Ambulatory Visit (INDEPENDENT_AMBULATORY_CARE_PROVIDER_SITE_OTHER): Payer: Medicare Other | Admitting: Internal Medicine

## 2020-04-27 ENCOUNTER — Other Ambulatory Visit: Payer: Self-pay

## 2020-04-27 VITALS — BP 160/80 | HR 65 | Temp 97.7°F | Wt 208.3 lb

## 2020-04-27 DIAGNOSIS — F411 Generalized anxiety disorder: Secondary | ICD-10-CM | POA: Diagnosis not present

## 2020-04-27 DIAGNOSIS — R5383 Other fatigue: Secondary | ICD-10-CM | POA: Diagnosis not present

## 2020-04-27 DIAGNOSIS — Z23 Encounter for immunization: Secondary | ICD-10-CM | POA: Diagnosis not present

## 2020-04-27 DIAGNOSIS — I10 Essential (primary) hypertension: Secondary | ICD-10-CM | POA: Diagnosis not present

## 2020-04-27 MED ORDER — ESCITALOPRAM OXALATE 5 MG PO TABS: 2.5000 mg | ORAL_TABLET | Freq: Every day | ORAL | 1 refills | Status: DC

## 2020-04-27 NOTE — Addendum Note (Signed)
Addended by: Westley Hummer B on: 04/27/2020 05:16 PM   Modules accepted: Orders

## 2020-04-27 NOTE — Progress Notes (Signed)
Established Patient Office Visit     This visit occurred during the SARS-CoV-2 public health emergency.  Safety protocols were in place, including screening questions prior to the visit, additional usage of staff PPE, and extensive cleaning of exam room while observing appropriate contact time as indicated for disinfecting solutions.    CC/Reason for Visit: Follow-up chronic conditions, flu shot  HPI: Joshua Lopez is a 86 y.o. male who is coming in today for the above mentioned reasons. Past Medical History is significant for: GERD on daily PPI therapy, well-controlled hypertension, hyperlipidemia, generalized anxiety disorder on Lexapro, BPH with nocturia.  He would like me to look at his ears as he believes he might have cerumen impaction.  His wife has noted that he has become more sleepy throughout the day.  Yesterday he celebrated his 64th birthday.  He has not yet taken his blood pressure medication today.  Blood pressure is elevated in office to 160/80.  They are requesting a flu vaccine.   Past Medical/Surgical History: Past Medical History:  Diagnosis Date  . Arthritis    diffuse  . BPH (benign prostatic hypertrophy)    Dr. Risa Grill monitors  elevated PSA  . Chronic lower back pain 05-10-12   States pain is related to hip  . Colon polyp    2006 and 2010  . Esophageal stricture   . GERD (gastroesophageal reflux disease)   . Hyperlipidemia    NMR  2007  . Hypertension   . Subclavian steal syndrome 1997    Past Surgical History:  Procedure Laterality Date  . BACK SURGERY  05-10-12   lumbar fusion with hardware  . CATARACT EXTRACTION W/ INTRAOCULAR LENS  IMPLANT, BILATERAL    . COLONOSCOPY W/ POLYPECTOMY  2006 & 2010  . ESOPHAGEAL DILATION  2003  . GANGLION CYST EXCISION  1999   LUE  . GANGLION CYST EXCISION  05/14/2012   Procedure: REMOVAL GANGLION OF WRIST;  Surgeon: Mcarthur Rossetti, MD;  Location: [facility];  Service: Orthopedics;  Laterality: Right;   Excision Right Wrist Ganglion Cyst  . JOINT REPLACEMENT    . KNEE ARTHROSCOPY  2001   left  . POSTERIOR FUSION LUMBAR SPINE  02/08/12   L2-L4  . PROSTATE BIOPSY  2000   Dr. Rosana Hoes  . [place]   right side  . TOTAL HIP ARTHROPLASTY  05/14/2012   Procedure: TOTAL HIP ARTHROPLASTY ANTERIOR APPROACH;  Surgeon: Mcarthur Rossetti, MD;  Location: [facility];  Service: Orthopedics;  Laterality: Right;  Right Total Hip Arthroplasty, Anterior Approach (C-Arm)  . TOTAL SHOULDER ARTHROPLASTY  2010   Dr. Mardelle Matte; right    Social History:  reports that he quit smoking about 46 years ago. His smoking use included cigarettes. He has a 16.50 pack-year smoking history. He quit smokeless tobacco use about 45 years ago.  His smokeless tobacco use included chew. He reports that he does not drink alcohol and does not use drugs.  Allergies: Allergies  Allergen Reactions  . Aspirin Rash and Other (See Comments)    Dyspnea, eyelid edema, truncal urticaria--Patient tolerates other NSAIDS at home Because of a history of documented adverse serious drug reaction;Medi Alert bracelet  is recommended Other reaction(s): Other (See Comments) Dyspnea, eyelid edema, truncal urticaria--Patient tolerates other NSAIDS at home Because of a history of documented adverse serious drug reaction;Medi Alert braceletis recommended    Family History:  Family History  Problem Relation Age of Onset  . Sudden death  Father        murdered  . Hypertension Mother   . Skin cancer Mother   . Skin cancer Brother   . Hypertension Brother   . Coronary artery disease Brother        CABG  . Colon cancer Paternal Uncle   . Breast cancer Paternal Grandmother   . Breast cancer Paternal Aunt   . Colon cancer Paternal Aunt   . Heart attack Paternal Uncle   . Kidney disease Neg Hx   . Diabetes Neg Hx      Current Outpatient Medications:  .  acetaminophen (TYLENOL) 500 MG tablet, Take 500 mg by mouth every 6  (six) hours as needed. For pain, Disp: , Rfl:  .  albuterol (PROVENTIL HFA;VENTOLIN HFA) 108 (90 Base) MCG/ACT inhaler, Inhale 2 puffs into the lungs every 6 (six) hours as needed for wheezing or shortness of breath., Disp: 1 Inhaler, Rfl: 0 .  Calcium Carbonate-Vitamin D (CALTRATE 600+D PO), Take 1 tablet by mouth 2 (two) times daily. , Disp: , Rfl:  .  escitalopram (LEXAPRO) 5 MG tablet, Take 0.5 tablets (2.5 mg total) by mouth at bedtime., Disp: 45 tablet, Rfl: 1 .  losartan (COZAAR) 25 MG tablet, TAKE 1 TABLET EVERY DAY (NEED MD APPOINTMENT), Disp: 90 tablet, Rfl: 1 .  montelukast (SINGULAIR) 10 MG tablet, TAKE 1 TABLET AT BEDTIME, Disp: 90 tablet, Rfl: 1 .  pantoprazole (PROTONIX) 40 MG tablet, TAKE 1 TABLET EVERY DAY, Disp: 90 tablet, Rfl: 1 .  polyethylene glycol (MIRALAX / GLYCOLAX) packet, Take 17 g by mouth 2 (two) times daily. , Disp: , Rfl:  .  pravastatin (PRAVACHOL) 20 MG tablet, TAKE 1 TABLET AT BEDTIME, Disp: 90 tablet, Rfl: 1 .  tamsulosin (FLOMAX) 0.4 MG CAPS capsule, Take 1 capsule (0.4 mg total) by mouth at bedtime., Disp: 90 capsule, Rfl: 1  Review of Systems:  Constitutional: Denies fever, chills, diaphoresis, appetite change and fatigue.  HEENT: Denies photophobia, eye pain, redness, hearing loss, ear pain, congestion, sore throat, rhinorrhea, sneezing, mouth sores, trouble swallowing, neck pain, neck stiffness and tinnitus.   Respiratory: Denies SOB, DOE, cough, chest tightness,  and wheezing.   Cardiovascular: Denies chest pain, palpitations and leg swelling.  Gastrointestinal: Denies nausea, vomiting, abdominal pain, diarrhea, constipation, blood in stool and abdominal distention.  Genitourinary: Denies dysuria, urgency, frequency, hematuria, flank pain and difficulty urinating.  Endocrine: Denies: hot or cold intolerance, sweats, changes in hair or nails, polyuria, polydipsia. Musculoskeletal: Denies myalgias, back pain, joint swelling, arthralgias and gait problem.    Skin: Denies pallor, rash and wound.  Neurological: Denies dizziness, seizures, syncope, weakness, light-headedness, numbness and headaches.  Hematological: Denies adenopathy. Easy bruising, personal or family bleeding history  Psychiatric/Behavioral: Denies suicidal ideation, mood changes, confusion, nervousness, sleep disturbance and agitation    Physical Exam: Vitals:   04/27/20 0953  BP: (!) 160/80  Pulse: 65  Temp: 97.7 F (36.5 C)  TempSrc: Oral  SpO2: 96%  Weight: 208 lb 4.8 oz (94.5 kg)    Body mass index is 30.76 kg/m.   Constitutional: NAD, calm, comfortable Eyes: PERRL, lids and conjunctivae normal ENMT: Mucous membranes are moist. Tympanic membrane is pearly white, no erythema or bulging. Respiratory: clear to auscultation bilaterally, no wheezing, no crackles. Normal respiratory effort. No accessory muscle use.  Cardiovascular: Regular rate and rhythm, no murmurs / rubs / gallops. No extremity edema. Neurologic: Grossly intact and nonfocal Psychiatric: Normal judgment and insight. Alert and oriented x 3. Normal mood.  Impression and Plan:  GAD (generalized anxiety disorder)  Lethargy -Anxiety is very well controlled, I wonder if lethargy might be due to Lexapro. -He is on 5 mg nightly, I have instructed them to cut the tablet in half until he sees me next and to notify me if this does not correct his issue.  Essential hypertension -Blood pressure is elevated in office today to 160/80, wife notes that pressures at home have been stable, -He will do ambulatory blood pressure monitoring and notify me if blood pressures remain elevated.  Need for influenza vaccination -Flu vaccine administered today.   Patient Instructions  -Nice seeing you today!!  -Make sure you check your blood pressure 2-3 times a week and return in 6 months for follow up.  -Cut lexapro in half and take at bedtime.  -Schedule follow up in 6 months.       Lelon Frohlich, MD [facility]

## 2020-04-27 NOTE — Patient Instructions (Addendum)
-  Nice seeing you today!!  -Make sure you check your blood pressure 2-3 times a week and return in 6 months for follow up.  -Cut lexapro in half and take at bedtime.  -Schedule follow up in 6 months.

## 2020-05-26 ENCOUNTER — Telehealth: Payer: Self-pay | Admitting: Internal Medicine

## 2020-05-26 DIAGNOSIS — R4189 Other symptoms and signs involving cognitive functions and awareness: Secondary | ICD-10-CM

## 2020-05-26 NOTE — Telephone Encounter (Signed)
Okay to refer? 

## 2020-05-26 NOTE — Telephone Encounter (Signed)
Pts spouse is calling in stating that she would like to see if they can get a referral to C. Floyde Parkins, MD  Neurologist due to the pt is forgetting a lot and will get on a subject and will keep talking about it until he goes to sleep.  (ex: changing oil at 20% and state don't let it get down any farther than that and will keep going to check on the level).

## 2020-05-26 NOTE — Telephone Encounter (Signed)
Referral placed.

## 2020-05-26 NOTE — Telephone Encounter (Signed)
Yes

## 2020-05-31 DIAGNOSIS — Z23 Encounter for immunization: Secondary | ICD-10-CM | POA: Diagnosis not present

## 2020-06-06 ENCOUNTER — Other Ambulatory Visit: Payer: Self-pay

## 2020-06-06 ENCOUNTER — Encounter (HOSPITAL_COMMUNITY): Payer: Self-pay | Admitting: Emergency Medicine

## 2020-06-06 ENCOUNTER — Emergency Department (HOSPITAL_COMMUNITY)
Admission: EM | Admit: 2020-06-06 | Discharge: 2020-06-06 | Disposition: A | Discharge status: Home / Self Care | Payer: Medicare Other | Attending: Emergency Medicine | Admitting: Emergency Medicine

## 2020-06-06 DIAGNOSIS — Z79899 Other long term (current) drug therapy: Secondary | ICD-10-CM | POA: Insufficient documentation

## 2020-06-06 DIAGNOSIS — I10 Essential (primary) hypertension: Secondary | ICD-10-CM | POA: Insufficient documentation

## 2020-06-06 DIAGNOSIS — Z87891 Personal history of nicotine dependence: Secondary | ICD-10-CM | POA: Diagnosis not present

## 2020-06-06 DIAGNOSIS — Z96641 Presence of right artificial hip joint: Secondary | ICD-10-CM | POA: Diagnosis not present

## 2020-06-06 DIAGNOSIS — Z96611 Presence of right artificial shoulder joint: Secondary | ICD-10-CM | POA: Insufficient documentation

## 2020-06-06 DIAGNOSIS — F419 Anxiety disorder, unspecified: Secondary | ICD-10-CM | POA: Diagnosis not present

## 2020-06-06 MED ORDER — LORAZEPAM 1 MG PO TABS: 1.0000 mg | ORAL_TABLET | Freq: Every evening | ORAL | 0 refills | Status: DC | PRN

## 2020-06-06 MED ORDER — LORAZEPAM 1 MG PO TABS
1.0000 mg | ORAL_TABLET | Freq: Once | ORAL | Status: AC
  Administered 2020-06-06: 1 mg via ORAL
  Filled 2020-06-06: qty 1

## 2020-06-06 NOTE — ED Notes (Signed)
Pt resting comfortably sitting on side of bed. Does not appear in distress. Respirations are even and non-labored.  Skin is warm, dry and intact.  Pt denies pain at this time Pt feeling restless and "like I am going to scream" Calm and cooperative with staff.

## 2020-06-06 NOTE — ED Triage Notes (Signed)
Patient reports feeling anxious " nervous " this morning , alert and oriented , denies pain /respirations unlabored .

## 2020-06-06 NOTE — ED Provider Notes (Signed)
[facility] Provider Note   CSN: [id number] Arrival date & time: 06/06/20  0434     History Chief Complaint  Patient presents with  . Anxiety    Joshua Lopez is a 86 y.o. male.  Pt presents to the ED today with severe anxiety.  He has a hx of GAD and takes Lexapro.  It had been well controlled.  His wife said he had been having excessive daytime sleepiness, so his pcp decreased the Lexapro in half.  This made his anxiety worse and he stopped sleeping.  He went back to his original dose, but still remains extremely anxious.        Past Medical History:  Diagnosis Date  . Arthritis    diffuse  . BPH (benign prostatic hypertrophy)    Dr. Risa Grill monitors  elevated PSA  . Chronic lower back pain 05-10-12   States pain is related to hip  . Colon polyp    2006 and 2010  . Esophageal stricture   . GERD (gastroesophageal reflux disease)   . Hyperlipidemia    NMR  2007  . Hypertension   . Subclavian steal syndrome 1997    Patient Active Problem List   Diagnosis Date Noted  . GAD (generalized anxiety disorder) 07/11/2018  . Cognitive impairment 11/27/2017  . Seasonal rhinitis 06/10/2012  . Degenerative arthritis of hip 05/14/2012  . Osteoarthritis 01/22/2009  . History of colonic polyps 12/01/2008  . SPINAL STENOSIS, LUMBAR 06/04/2008  . HYPERLIPIDEMIA 04/11/2007  . Essential hypertension 04/11/2007  . GERD 04/11/2007  . BENIGN PROSTATIC HYPERTROPHY 04/11/2007  . ESOPHAGEAL STRICTURE 12/12/2006  . Other specified abnormal findings of blood chemistry 12/12/2006  . PSA, INCREASED 12/12/2006    Past Surgical History:  Procedure Laterality Date  . BACK SURGERY  05-10-12   lumbar fusion with hardware  . CATARACT EXTRACTION W/ INTRAOCULAR LENS  IMPLANT, BILATERAL    . COLONOSCOPY W/ POLYPECTOMY  2006 & 2010  . ESOPHAGEAL DILATION  2003  . GANGLION CYST EXCISION  1999   LUE  . GANGLION CYST EXCISION  05/14/2012   Procedure:  REMOVAL GANGLION OF WRIST;  Surgeon: Mcarthur Rossetti, MD;  Location: [facility];  Service: Orthopedics;  Laterality: Right;  Excision Right Wrist Ganglion Cyst  . JOINT REPLACEMENT    . KNEE ARTHROSCOPY  2001   left  . POSTERIOR FUSION LUMBAR SPINE  02/08/12   L2-L4  . PROSTATE BIOPSY  2000   Dr. Rosana Hoes  . [place]   right side  . TOTAL HIP ARTHROPLASTY  05/14/2012   Procedure: TOTAL HIP ARTHROPLASTY ANTERIOR APPROACH;  Surgeon: Mcarthur Rossetti, MD;  Location: [facility];  Service: Orthopedics;  Laterality: Right;  Right Total Hip Arthroplasty, Anterior Approach (C-Arm)  . TOTAL SHOULDER ARTHROPLASTY  2010   Dr. Mardelle Matte; right       Family History  Problem Relation Age of Onset  . Sudden death Father        murdered  . Hypertension Mother   . Skin cancer Mother   . Skin cancer Brother   . Hypertension Brother   . Coronary artery disease Brother        CABG  . Colon cancer Paternal Uncle   . Breast cancer Paternal Grandmother   . Breast cancer Paternal Aunt   . Colon cancer Paternal Aunt   . Heart attack Paternal Uncle   . Kidney disease Neg Hx   . Diabetes Neg Hx  Social History   Tobacco Use  . Smoking status: Former Smoker    Packs/day: 0.50    Years: 33.00    Pack years: 16.50    Types: Cigarettes    Quit date: 07/24/1973    Years since quitting: 46.9  . Smokeless tobacco: Former Systems developer    Types: Chew    Quit date: 07/24/1974  . Tobacco comment: smoked [place] , up to 1/3 ppd  Substance Use Topics  . Alcohol use: No  . Drug use: No    Home Medications Prior to Admission medications   Medication Sig Start Date End Date Taking? Authorizing Provider  acetaminophen (TYLENOL) 500 MG tablet Take 500 mg by mouth every 6 (six) hours as needed. For pain    [provider]  albuterol (PROVENTIL HFA;VENTOLIN HFA) 108 (90 Base) MCG/ACT inhaler Inhale 2 puffs into the lungs every 6 (six) hours as needed for wheezing or shortness of  breath. 10/01/18   [place], Betty G, MD  Calcium Carbonate-Vitamin D (CALTRATE 600+D PO) Take 1 tablet by mouth 2 (two) times daily.     [provider]  escitalopram (LEXAPRO) 5 MG tablet Take 0.5 tablets (2.5 mg total) by mouth at bedtime. 04/27/20   Isaac Bliss, Rayford Halsted, MD  LORazepam (ATIVAN) 1 MG tablet Take 1 tablet (1 mg total) by mouth at bedtime as needed for anxiety. 06/06/20   Isla Pence, MD  losartan (COZAAR) 25 MG tablet TAKE 1 TABLET EVERY DAY (NEED MD APPOINTMENT) 04/22/20   Isaac Bliss, Rayford Halsted, MD  montelukast (SINGULAIR) 10 MG tablet TAKE 1 TABLET AT BEDTIME 04/22/20   Isaac Bliss, Rayford Halsted, MD  pantoprazole (PROTONIX) 40 MG tablet TAKE 1 TABLET EVERY DAY 04/22/20   Isaac Bliss, Rayford Halsted, MD  polyethylene glycol [facility] / Floria Raveling) packet Take 17 g by mouth 2 (two) times daily.     [provider]  pravastatin (PRAVACHOL) 20 MG tablet TAKE 1 TABLET AT BEDTIME 04/22/20   Isaac Bliss, Rayford Halsted, MD  tamsulosin (FLOMAX) 0.4 MG CAPS capsule Take 1 capsule (0.4 mg total) by mouth at bedtime. 12/09/18   Isaac Bliss, Rayford Halsted, MD    Allergies    Aspirin  Review of Systems   Review of Systems  Psychiatric/Behavioral: The patient is nervous/anxious.   All other systems reviewed and are negative.   Physical Exam Updated Vital Signs BP (!) 171/87 (BP Location: Right Arm)   Pulse (!) 56   Temp 97.8 F (36.6 C) (Oral)   Resp 16   SpO2 99%   Physical Exam Vitals and nursing note reviewed.  Constitutional:      Appearance: Normal appearance.  HENT:     Head: Normocephalic and atraumatic.     Right Ear: External ear normal.     Left Ear: External ear normal.     Nose: Nose normal.     Mouth/Throat:     Mouth: Mucous membranes are moist.     Pharynx: Oropharynx is clear.  Eyes:     Extraocular Movements: Extraocular movements intact.     Conjunctiva/sclera: Conjunctivae normal.     Pupils: Pupils are equal, round, and  reactive to light.  Cardiovascular:     Rate and Rhythm: Normal rate and regular rhythm.     Pulses: Normal pulses.     Heart sounds: Normal heart sounds.  Pulmonary:     Effort: Pulmonary effort is normal.     Breath sounds: Normal breath sounds.  Abdominal:  General: Abdomen is flat. Bowel sounds are normal.     Palpations: Abdomen is soft.  Musculoskeletal:        General: Normal range of motion.     Cervical back: Normal range of motion and neck supple.  Skin:    General: Skin is warm.     Capillary Refill: Capillary refill takes less than 2 seconds.  Neurological:     General: No focal deficit present.     Mental Status: He is alert and oriented to person, place, and time.  Psychiatric:        Mood and Affect: Mood is anxious.     ED Results / Procedures / Treatments   Labs (all labs ordered are listed, but only abnormal results are displayed) Labs Reviewed - No data to display  EKG None  Radiology No results found.  Procedures Procedures (including critical care time)  Medications Ordered in ED Medications  LORazepam (ATIVAN) tablet 1 mg (has no administration in time range)    ED Course  I have reviewed the triage vital signs and the nursing notes.  Pertinent labs & imaging results that were available during my care of the patient were reviewed by me and considered in my medical decision making (see chart for details).    MDM Rules/Calculators/A&P                          Pt will be given a short course of ativan to help with the anxiety and help him sleep.  He knows to f/u with pcp regarding additional instructions for his anxiety.  Final Clinical Impression(s) / ED Diagnoses Final diagnoses:  Anxiety    Rx / DC Orders ED Discharge Orders         Ordered    LORazepam (ATIVAN) 1 MG tablet  At bedtime PRN        06/06/20 0825           Isla Pence, MD 06/06/20 [phone]

## 2020-06-11 ENCOUNTER — Other Ambulatory Visit: Payer: Self-pay

## 2020-06-11 ENCOUNTER — Encounter: Payer: Self-pay | Admitting: Internal Medicine

## 2020-06-11 ENCOUNTER — Ambulatory Visit (INDEPENDENT_AMBULATORY_CARE_PROVIDER_SITE_OTHER): Payer: Medicare Other | Admitting: Internal Medicine

## 2020-06-11 VITALS — BP 102/60 | HR 54 | Temp 98.1°F | Wt 198.7 lb

## 2020-06-11 DIAGNOSIS — F411 Generalized anxiety disorder: Secondary | ICD-10-CM

## 2020-06-11 DIAGNOSIS — Z09 Encounter for follow-up examination after completed treatment for conditions other than malignant neoplasm: Secondary | ICD-10-CM

## 2020-06-11 DIAGNOSIS — I10 Essential (primary) hypertension: Secondary | ICD-10-CM

## 2020-06-11 MED ORDER — ESCITALOPRAM OXALATE 5 MG PO TABS: 5.0000 mg | ORAL_TABLET | Freq: Every day | ORAL | 1 refills | Status: DC

## 2020-06-11 NOTE — Progress Notes (Signed)
Established Patient Office Visit     This visit occurred during the SARS-CoV-2 public health emergency.  Safety protocols were in place, including screening questions prior to the visit, additional usage of staff PPE, and extensive cleaning of exam room while observing appropriate contact time as indicated for disinfecting solutions.    CC/Reason for Visit: ED follow-up, follow-up blood pressure  HPI: Joshua Lopez is a 86 y.o. male who is coming in today for the above mentioned reasons.  At last visit he was noted to have elevated blood pressure and he was asked to carry out a blood pressure log which she brings in today for my evaluation.  His blood pressure has been anywhere from 751-025 systolic with averages in the 125 range.  He also had to visit the emergency department on 14 November due to increased anxiety.  At our prior visit we had decreased his Lexapro from 5 to 2.5 mg at nighttime as his wife described excessive daytime somnolence.  Subsequent to this it appears he had increased anxiety.  He states that he felt really restless, like his heart was racing at times, this prompted an ED evaluation.  Records from that visit have been reviewed in detail.  His EKG was normal, lab work was normal.  He was asked to take again the full tablet of Lexapro and was given 10 tablets of Ativan.  He did not increase the Lexapro.  He still has Ativan remaining, he is not using it every day.   Past Medical/Surgical History: Past Medical History:  Diagnosis Date  . Arthritis    diffuse  . BPH (benign prostatic hypertrophy)    Dr. Risa Grill monitors  elevated PSA  . Chronic lower back pain 05-10-12   States pain is related to hip  . Colon polyp    2006 and 2010  . Esophageal stricture   . GERD (gastroesophageal reflux disease)   . Hyperlipidemia    NMR  2007  . Hypertension   . Subclavian steal syndrome 1997    Past Surgical History:  Procedure Laterality Date  . BACK SURGERY   05-10-12   lumbar fusion with hardware  . CATARACT EXTRACTION W/ INTRAOCULAR LENS  IMPLANT, BILATERAL    . COLONOSCOPY W/ POLYPECTOMY  2006 & 2010  . ESOPHAGEAL DILATION  2003  . GANGLION CYST EXCISION  1999   LUE  . GANGLION CYST EXCISION  05/14/2012   Procedure: REMOVAL GANGLION OF WRIST;  Surgeon: Mcarthur Rossetti, MD;  Location: [facility];  Service: Orthopedics;  Laterality: Right;  Excision Right Wrist Ganglion Cyst  . JOINT REPLACEMENT    . KNEE ARTHROSCOPY  2001   left  . POSTERIOR FUSION LUMBAR SPINE  02/08/12   L2-L4  . PROSTATE BIOPSY  2000   Dr. Rosana Hoes  . [place]   right side  . TOTAL HIP ARTHROPLASTY  05/14/2012   Procedure: TOTAL HIP ARTHROPLASTY ANTERIOR APPROACH;  Surgeon: Mcarthur Rossetti, MD;  Location: [facility];  Service: Orthopedics;  Laterality: Right;  Right Total Hip Arthroplasty, Anterior Approach (C-Arm)  . TOTAL SHOULDER ARTHROPLASTY  2010   Dr. Mardelle Matte; right    Social History:  reports that he quit smoking about 46 years ago. His smoking use included cigarettes. He has a 16.50 pack-year smoking history. He quit smokeless tobacco use about 45 years ago.  His smokeless tobacco use included chew. He reports that he does not drink alcohol and does not use drugs.  Allergies: Allergies  Allergen Reactions  . Aspirin Rash and Other (See Comments)    Dyspnea, eyelid edema, truncal urticaria--Patient tolerates other NSAIDS at home Because of a history of documented adverse serious drug reaction;Medi Alert bracelet  is recommended Other reaction(s): Other (See Comments) Dyspnea, eyelid edema, truncal urticaria--Patient tolerates other NSAIDS at home Because of a history of documented adverse serious drug reaction;Medi Alert braceletis recommended    Family History:  Family History  Problem Relation Age of Onset  . Sudden death Father        murdered  . Hypertension Mother   . Skin cancer Mother   . Skin cancer Brother   .  Hypertension Brother   . Coronary artery disease Brother        CABG  . Colon cancer Paternal Uncle   . Breast cancer Paternal Grandmother   . Breast cancer Paternal Aunt   . Colon cancer Paternal Aunt   . Heart attack Paternal Uncle   . Kidney disease Neg Hx   . Diabetes Neg Hx      Current Outpatient Medications:  .  acetaminophen (TYLENOL) 500 MG tablet, Take 500 mg by mouth every 6 (six) hours as needed. For pain, Disp: , Rfl:  .  albuterol (PROVENTIL HFA;VENTOLIN HFA) 108 (90 Base) MCG/ACT inhaler, Inhale 2 puffs into the lungs every 6 (six) hours as needed for wheezing or shortness of breath., Disp: 1 Inhaler, Rfl: 0 .  Calcium Carbonate-Vitamin D (CALTRATE 600+D PO), Take 1 tablet by mouth 2 (two) times daily. , Disp: , Rfl:  .  escitalopram (LEXAPRO) 5 MG tablet, Take 1 tablet (5 mg total) by mouth at bedtime., Disp: 90 tablet, Rfl: 1 .  LORazepam (ATIVAN) 1 MG tablet, Take 1 tablet (1 mg total) by mouth at bedtime as needed for anxiety., Disp: 15 tablet, Rfl: 0 .  losartan (COZAAR) 25 MG tablet, TAKE 1 TABLET EVERY DAY (NEED MD APPOINTMENT), Disp: 90 tablet, Rfl: 1 .  montelukast (SINGULAIR) 10 MG tablet, TAKE 1 TABLET AT BEDTIME, Disp: 90 tablet, Rfl: 1 .  pantoprazole (PROTONIX) 40 MG tablet, TAKE 1 TABLET EVERY DAY, Disp: 90 tablet, Rfl: 1 .  polyethylene glycol (MIRALAX / GLYCOLAX) packet, Take 17 g by mouth 2 (two) times daily. , Disp: , Rfl:  .  pravastatin (PRAVACHOL) 20 MG tablet, TAKE 1 TABLET AT BEDTIME, Disp: 90 tablet, Rfl: 1 .  tamsulosin (FLOMAX) 0.4 MG CAPS capsule, Take 1 capsule (0.4 mg total) by mouth at bedtime., Disp: 90 capsule, Rfl: 1  Review of Systems:  Constitutional: Denies fever, chills, diaphoresis, appetite change and fatigue.  HEENT: Denies photophobia, eye pain, redness, hearing loss, ear pain, congestion, sore throat, rhinorrhea, sneezing, mouth sores, trouble swallowing, neck pain, neck stiffness and tinnitus.   Respiratory: Denies SOB, DOE,  cough, chest tightness,  and wheezing.   Cardiovascular: Denies chest pain, palpitations and leg swelling.  Gastrointestinal: Denies nausea, vomiting, abdominal pain, diarrhea, constipation, blood in stool and abdominal distention.  Genitourinary: Denies dysuria, urgency, frequency, hematuria, flank pain and difficulty urinating.  Endocrine: Denies: hot or cold intolerance, sweats, changes in hair or nails, polyuria, polydipsia. Musculoskeletal: Denies myalgias, back pain, joint swelling, arthralgias and gait problem.  Skin: Denies pallor, rash and wound.  Neurological: Denies dizziness, seizures, syncope, weakness, light-headedness, numbness and headaches.  Hematological: Denies adenopathy. Easy bruising, personal or family bleeding history  Psychiatric/Behavioral: Denies suicidal ideation, confusion,  sleep disturbance and agitation    Physical Exam: Vitals:   06/11/20 1030  BP: 102/60  Pulse: (!) 54  Temp: 98.1 F (36.7 C)  TempSrc: Oral  SpO2: 95%  Weight: 198 lb 11.2 oz (90.1 kg)    Body mass index is 29.34 kg/m.   Constitutional: NAD, calm, comfortable Eyes: PERRL, lids and conjunctivae normal ENMT: Mucous membranes are moist.  Very hard of hearing. Respiratory: clear to auscultation bilaterally, no wheezing, no crackles. Normal respiratory effort. No accessory muscle use.  Cardiovascular: Regular rate and rhythm, no murmurs / rubs / gallops. No extremity edema.  Neurologic: Grossly intact and nonfocal Psychiatric: Normal judgment and insight. Alert and oriented x 3. Normal mood.    Impression and Plan:  Hospital discharge follow-up GAD (generalized anxiety disorder) - Plan: escitalopram (LEXAPRO) 5 MG tablet Essential hypertension  -ED records have been reviewed in detail. -He did not increase Lexapro as recommended to 5 mg. -He will increase Lexapro to 5 mg, I have advised him to take it at bedtime instead of during the day to see if we can decrease daytime  somnolence issues. -Blood pressure is well in office today and also his log is within acceptable range. -He will follow-up with me in 3 months or sooner as needed.  Time spent: 32 minutes reviewing hospital records and discussing plan of care with patient.    Lelon Frohlich, MD [facility] at [facility]

## 2020-06-24 ENCOUNTER — Encounter: Payer: Self-pay | Admitting: Internal Medicine

## 2020-06-24 ENCOUNTER — Other Ambulatory Visit: Payer: Self-pay

## 2020-06-24 ENCOUNTER — Ambulatory Visit (INDEPENDENT_AMBULATORY_CARE_PROVIDER_SITE_OTHER): Payer: Medicare Other | Admitting: Internal Medicine

## 2020-06-24 ENCOUNTER — Telehealth: Payer: Self-pay | Admitting: Internal Medicine

## 2020-06-24 VITALS — BP 110/80 | HR 67 | Temp 97.5°F | Wt 200.9 lb

## 2020-06-24 DIAGNOSIS — F411 Generalized anxiety disorder: Secondary | ICD-10-CM | POA: Diagnosis not present

## 2020-06-24 DIAGNOSIS — F19982 Other psychoactive substance use, unspecified with psychoactive substance-induced sleep disorder: Secondary | ICD-10-CM

## 2020-06-24 MED ORDER — ESCITALOPRAM OXALATE 5 MG PO TABS: 5.0000 mg | ORAL_TABLET | Freq: Every day | ORAL | 1 refills | Status: DC

## 2020-06-24 NOTE — Telephone Encounter (Signed)
Pts spouse is calling in stating that the pt has always been taking 5 MG of Rx escitalopram (LEXAPRO) instead of 2.5 MG that was stated in the office that he was taken.  Pts spouse stated that she was not able to come in to let Dr. Jerilee Hoh know due to her being sick but the daughter was the one that came in.  Spouse would like to see if he can have something different due to him not getting any sleep walking all night and sleeping off and on during the day.

## 2020-06-24 NOTE — Progress Notes (Signed)
Established Patient Office Visit     This visit occurred during the SARS-CoV-2 public health emergency.  Safety protocols were in place, including screening questions prior to the visit, additional usage of staff PPE, and extensive cleaning of exam room while observing appropriate contact time as indicated for disinfecting solutions.    CC/Reason for Visit: Insomnia  HPI: Joshua Lopez is a 86 y.o. male who is coming in today for the above mentioned reasons.  He is here today to follow-up on his insomnia issues.  In late October we had dropped his Lexapro dose from 5 to 2.5 mg daily due to what the wife described as excessive daytime somnolence.  Subsequent to this he had to go to the emergency room due to severe anxiety.  He was prescribed Ativan in the ED.  Ever since using the Ativan he appears to have had a paradoxical effect and is now having severe insomnia and increased anxiety.  He is not sleeping at all at night, keeping family members awake.  I last saw him on November 19.  At that time he was advised to discontinue Ativan use and to go back to a full 5 mg tablet of Lexapro and to dose it at nighttime instead of during the day.  This apparently has not happened.  He is here today with his daughter who provides most of the history.   Past Medical/Surgical History: Past Medical History:  Diagnosis Date  . Arthritis    diffuse  . BPH (benign prostatic hypertrophy)    Dr. Risa Grill monitors  elevated PSA  . Chronic lower back pain 05-10-12   States pain is related to hip  . Colon polyp    2006 and 2010  . Esophageal stricture   . GERD (gastroesophageal reflux disease)   . Hyperlipidemia    NMR  2007  . Hypertension   . Subclavian steal syndrome 1997    Past Surgical History:  Procedure Laterality Date  . BACK SURGERY  05-10-12   lumbar fusion with hardware  . CATARACT EXTRACTION W/ INTRAOCULAR LENS  IMPLANT, BILATERAL    . COLONOSCOPY W/ POLYPECTOMY  2006 & 2010  .  ESOPHAGEAL DILATION  2003  . GANGLION CYST EXCISION  1999   LUE  . GANGLION CYST EXCISION  05/14/2012   Procedure: REMOVAL GANGLION OF WRIST;  Surgeon: Mcarthur Rossetti, MD;  Location: [facility];  Service: Orthopedics;  Laterality: Right;  Excision Right Wrist Ganglion Cyst  . JOINT REPLACEMENT    . KNEE ARTHROSCOPY  2001   left  . POSTERIOR FUSION LUMBAR SPINE  02/08/12   L2-L4  . PROSTATE BIOPSY  2000   Dr. Rosana Hoes  . [place]   right side  . TOTAL HIP ARTHROPLASTY  05/14/2012   Procedure: TOTAL HIP ARTHROPLASTY ANTERIOR APPROACH;  Surgeon: Mcarthur Rossetti, MD;  Location: [facility];  Service: Orthopedics;  Laterality: Right;  Right Total Hip Arthroplasty, Anterior Approach (C-Arm)  . TOTAL SHOULDER ARTHROPLASTY  2010   Dr. Mardelle Matte; right    Social History:  reports that he quit smoking about 46 years ago. His smoking use included cigarettes. He has a 16.50 pack-year smoking history. He quit smokeless tobacco use about 45 years ago.  His smokeless tobacco use included chew. He reports that he does not drink alcohol and does not use drugs.  Allergies: Allergies  Allergen Reactions  . Aspirin Rash and Other (See Comments)    Dyspnea, eyelid edema, truncal  urticaria--Patient tolerates other NSAIDS at home Because of a history of documented adverse serious drug reaction;Medi Alert bracelet  is recommended Other reaction(s): Other (See Comments) Dyspnea, eyelid edema, truncal urticaria--Patient tolerates other NSAIDS at home Because of a history of documented adverse serious drug reaction;Medi Alert braceletis recommended    Family History:  Family History  Problem Relation Age of Onset  . Sudden death Father        murdered  . Hypertension Mother   . Skin cancer Mother   . Skin cancer Brother   . Hypertension Brother   . Coronary artery disease Brother        CABG  . Colon cancer Paternal Uncle   . Breast cancer Paternal Grandmother   . Breast  cancer Paternal Aunt   . Colon cancer Paternal Aunt   . Heart attack Paternal Uncle   . Kidney disease Neg Hx   . Diabetes Neg Hx      Current Outpatient Medications:  .  acetaminophen (TYLENOL) 500 MG tablet, Take 500 mg by mouth every 6 (six) hours as needed. For pain, Disp: , Rfl:  .  albuterol (PROVENTIL HFA;VENTOLIN HFA) 108 (90 Base) MCG/ACT inhaler, Inhale 2 puffs into the lungs every 6 (six) hours as needed for wheezing or shortness of breath., Disp: 1 Inhaler, Rfl: 0 .  Calcium Carbonate-Vitamin D (CALTRATE 600+D PO), Take 1 tablet by mouth 2 (two) times daily. , Disp: , Rfl:  .  escitalopram (LEXAPRO) 5 MG tablet, Take 1 tablet (5 mg total) by mouth at bedtime., Disp: 90 tablet, Rfl: 1 .  LORazepam (ATIVAN) 1 MG tablet, Take 1 tablet (1 mg total) by mouth at bedtime as needed for anxiety., Disp: 15 tablet, Rfl: 0 .  losartan (COZAAR) 25 MG tablet, TAKE 1 TABLET EVERY DAY (NEED MD APPOINTMENT), Disp: 90 tablet, Rfl: 1 .  montelukast (SINGULAIR) 10 MG tablet, TAKE 1 TABLET AT BEDTIME, Disp: 90 tablet, Rfl: 1 .  pantoprazole (PROTONIX) 40 MG tablet, TAKE 1 TABLET EVERY DAY, Disp: 90 tablet, Rfl: 1 .  polyethylene glycol (MIRALAX / GLYCOLAX) packet, Take 17 g by mouth 2 (two) times daily. , Disp: , Rfl:  .  pravastatin (PRAVACHOL) 20 MG tablet, TAKE 1 TABLET AT BEDTIME, Disp: 90 tablet, Rfl: 1 .  tamsulosin (FLOMAX) 0.4 MG CAPS capsule, Take 1 capsule (0.4 mg total) by mouth at bedtime., Disp: 90 capsule, Rfl: 1  Review of Systems:  Constitutional: Denies fever, chills, diaphoresis, appetite change and fatigue.  HEENT: Denies photophobia, eye pain, redness, hearing loss, ear pain, congestion, sore throat, rhinorrhea, sneezing, mouth sores, trouble swallowing, neck pain, neck stiffness and tinnitus.   Respiratory: Denies SOB, DOE, cough, chest tightness,  and wheezing.   Cardiovascular: Denies chest pain, palpitations and leg swelling.  Gastrointestinal: Denies nausea, vomiting,  abdominal pain, diarrhea, constipation, blood in stool and abdominal distention.  Genitourinary: Denies dysuria, urgency, frequency, hematuria, flank pain and difficulty urinating.  Endocrine: Denies: hot or cold intolerance, sweats, changes in hair or nails, polyuria, polydipsia. Musculoskeletal: Denies myalgias, back pain, joint swelling, arthralgias and gait problem.  Skin: Denies pallor, rash and wound.  Neurological: Denies dizziness, seizures, syncope, weakness, light-headedness, numbness and headaches.  Hematological: Denies adenopathy. Easy bruising, personal or family bleeding history  Psychiatric/Behavioral: Denies suicidal ideation, positive for mood changes, confusion, nervousness, sleep disturbance and agitation    Physical Exam: Vitals:   06/24/20 1304  BP: 110/80  Pulse: 67  Temp: (!) 97.5 F (36.4 C)  TempSrc: Oral  SpO2: 97%  Weight: 200 lb 14.4 oz (91.1 kg)    Body mass index is 29.67 kg/m.   Constitutional: NAD, calm, comfortable Eyes: PERRL, lids and conjunctivae normal ENMT: Mucous membranes are moist.  Respiratory: clear to auscultation bilaterally, no wheezing, no crackles. Normal respiratory effort. No accessory muscle use.  Cardiovascular: Regular rate and rhythm, no murmurs / rubs / gallops. No extremity edema.   Impression and Plan:  Drug-induced insomnia (HCC)  GAD (generalized anxiety disorder)   -I suspect he has had a paradoxical benzodiazepine effect. -I have instructed to patient and daughter who are is present today that they discontinue immediately use of Ativan, as stated at prior visit, I have instructed that they increase the lorazepam back up to 5 mg (full tablet) and they dose it at nighttime instead of during the daytime in order to avoid daytime somnolence.  Told daughter that they need to find activities for him to do during the daytime, he has been taking a lot of naps throughout the day and this is in part why he is not sleeping at  nighttime. -He will follow-up with me in 4 weeks.   Patient Instructions  -Nice seeing you today!!  -STOP taking lorazepam.  -RESUME escitalopram (lexapro) 5 mg at bedtime.  -Schedule follow up in 4 weeks.     Lelon Frohlich, MD [facility] at [facility]

## 2020-06-24 NOTE — Telephone Encounter (Signed)
Wife is aware and med list updated

## 2020-06-24 NOTE — Patient Instructions (Signed)
-  Nice seeing you today!!  -STOP taking lorazepam.  -RESUME escitalopram (lexapro) 5 mg at bedtime.  -Schedule follow up in 4 weeks.

## 2020-06-24 NOTE — Telephone Encounter (Signed)
If truly has been taking lexapro 5 mg nightly the whole time (>6 weeks), ok to increase dose to 10 mg nightly. As discussed today, any med changes can take a few weeks to exert effect. Needs to remain occupied during the day to reduce napping so he is tired by nighttime. I would also recommend neurology referral.

## 2020-07-13 DIAGNOSIS — J3489 Other specified disorders of nose and nasal sinuses: Secondary | ICD-10-CM | POA: Diagnosis not present

## 2020-07-13 DIAGNOSIS — J339 Nasal polyp, unspecified: Secondary | ICD-10-CM | POA: Insufficient documentation

## 2020-07-21 ENCOUNTER — Other Ambulatory Visit: Payer: Self-pay

## 2020-07-22 ENCOUNTER — Ambulatory Visit (INDEPENDENT_AMBULATORY_CARE_PROVIDER_SITE_OTHER): Payer: Medicare Other | Admitting: Internal Medicine

## 2020-07-22 VITALS — BP 142/76 | HR 59 | Temp 97.8°F | Ht 69.0 in | Wt 200.8 lb

## 2020-07-22 DIAGNOSIS — G4700 Insomnia, unspecified: Secondary | ICD-10-CM | POA: Diagnosis not present

## 2020-07-22 DIAGNOSIS — F411 Generalized anxiety disorder: Secondary | ICD-10-CM

## 2020-07-22 NOTE — Progress Notes (Signed)
Established Patient Office Visit     This visit occurred during the SARS-CoV-2 public health emergency.  Safety protocols were in place, including screening questions prior to the visit, additional usage of staff PPE, and extensive cleaning of exam room while observing appropriate contact time as indicated for disinfecting solutions.    CC/Reason for Visit: Follow-up insomnia and anxiety  HPI: Joshua Lopez is a 86 y.o. male who is coming in today for the above mentioned reasons.  He has been dealing with insomnia and anxiety now for some time.  He appears to have had a paradoxical effect to benzodiazepine that was prescribed at urgent care which cause increased anxiety and insomnia.  He has since discontinued that, he has been taking Lexapro 5 mg at bedtime and this seems to help both with anxiety and with his sleeping issues.  His wife is here with him today and agrees that issues have improved.  She did give him some Benadryl for about 5 days but this has since been discontinued.   Past Medical/Surgical History: Past Medical History:  Diagnosis Date  . Arthritis    diffuse  . BPH (benign prostatic hypertrophy)    Dr. Isabel Caprice monitors  elevated PSA  . Chronic lower back pain 05-10-12   States pain is related to hip  . Colon polyp    2006 and 2010  . Esophageal stricture   . GERD (gastroesophageal reflux disease)   . Hyperlipidemia    NMR  2007  . Hypertension   . Subclavian steal syndrome 1997    Past Surgical History:  Procedure Laterality Date  . BACK SURGERY  05-10-12   lumbar fusion with hardware  . CATARACT EXTRACTION W/ INTRAOCULAR LENS  IMPLANT, BILATERAL    . COLONOSCOPY W/ POLYPECTOMY  2006 & 2010  . ESOPHAGEAL DILATION  2003  . GANGLION CYST EXCISION  1999   LUE  . GANGLION CYST EXCISION  05/14/2012   Procedure: REMOVAL GANGLION OF WRIST;  Surgeon: Kathryne Hitch, MD;  Location: [facility];  Service: Orthopedics;  Laterality: Right;  Excision Right  Wrist Ganglion Cyst  . JOINT REPLACEMENT    . KNEE ARTHROSCOPY  2001   left  . POSTERIOR FUSION LUMBAR SPINE  02/08/12   L2-L4  . PROSTATE BIOPSY  2000   Dr. Earlene Plater  . SUBCLAVIAN STENT PLACEMENT  1997   right side  . TOTAL HIP ARTHROPLASTY  05/14/2012   Procedure: TOTAL HIP ARTHROPLASTY ANTERIOR APPROACH;  Surgeon: Kathryne Hitch, MD;  Location: [facility];  Service: Orthopedics;  Laterality: Right;  Right Total Hip Arthroplasty, Anterior Approach (C-Arm)  . TOTAL SHOULDER ARTHROPLASTY  2010   Dr. Dion Saucier; right    Social History:  reports that he quit smoking about 47 years ago. His smoking use included cigarettes. He has a 16.50 pack-year smoking history. He quit smokeless tobacco use about 46 years ago.  His smokeless tobacco use included chew. He reports that he does not drink alcohol and does not use drugs.  Allergies: Allergies  Allergen Reactions  . Aspirin Rash and Other (See Comments)    Dyspnea, eyelid edema, truncal urticaria--Patient tolerates other NSAIDS at home Because of a history of documented adverse serious drug reaction;Medi Alert bracelet  is recommended Other reaction(s): Other (See Comments) Dyspnea, eyelid edema, truncal urticaria--Patient tolerates other NSAIDS at home Because of a history of documented adverse serious drug reaction;Medi Alert braceletis recommended    Family History:  Family History  Problem Relation Age  of Onset  . Sudden death Father        murdered  . Hypertension Mother   . Skin cancer Mother   . Skin cancer Brother   . Hypertension Brother   . Coronary artery disease Brother        CABG  . Colon cancer Paternal Uncle   . Breast cancer Paternal Grandmother   . Breast cancer Paternal Aunt   . Colon cancer Paternal Aunt   . Heart attack Paternal Uncle   . Kidney disease Neg Hx   . Diabetes Neg Hx      Current Outpatient Medications:  .  acetaminophen (TYLENOL) 500 MG tablet, Take 500 mg by mouth every 6 (six) hours as  needed. For pain, Disp: , Rfl:  .  albuterol (PROVENTIL HFA;VENTOLIN HFA) 108 (90 Base) MCG/ACT inhaler, Inhale 2 puffs into the lungs every 6 (six) hours as needed for wheezing or shortness of breath., Disp: 1 Inhaler, Rfl: 0 .  Calcium Carbonate-Vitamin D (CALTRATE 600+D PO), Take 1 tablet by mouth 2 (two) times daily. , Disp: , Rfl:  .  escitalopram (LEXAPRO) 10 MG tablet, Take 10 mg by mouth daily., Disp: , Rfl:  .  losartan (COZAAR) 25 MG tablet, TAKE 1 TABLET EVERY DAY (NEED MD APPOINTMENT), Disp: 90 tablet, Rfl: 1 .  montelukast (SINGULAIR) 10 MG tablet, TAKE 1 TABLET AT BEDTIME, Disp: 90 tablet, Rfl: 1 .  pantoprazole (PROTONIX) 40 MG tablet, TAKE 1 TABLET EVERY DAY, Disp: 90 tablet, Rfl: 1 .  polyethylene glycol (MIRALAX / GLYCOLAX) packet, Take 17 g by mouth 2 (two) times daily. , Disp: , Rfl:  .  pravastatin (PRAVACHOL) 20 MG tablet, TAKE 1 TABLET AT BEDTIME, Disp: 90 tablet, Rfl: 1 .  tamsulosin (FLOMAX) 0.4 MG CAPS capsule, Take 1 capsule (0.4 mg total) by mouth at bedtime., Disp: 90 capsule, Rfl: 1 .  LORazepam (ATIVAN) 1 MG tablet, Take 1 tablet (1 mg total) by mouth at bedtime as needed for anxiety., Disp: 15 tablet, Rfl: 0  Review of Systems:  Constitutional: Denies fever, chills, diaphoresis, appetite change and fatigue.  HEENT: Denies photophobia, eye pain, redness, hearing loss, ear pain, congestion, sore throat, rhinorrhea, sneezing, mouth sores, trouble swallowing, neck pain, neck stiffness and tinnitus.   Respiratory: Denies SOB, DOE, cough, chest tightness,  and wheezing.   Cardiovascular: Denies chest pain, palpitations and leg swelling.  Gastrointestinal: Denies nausea, vomiting, abdominal pain, diarrhea, constipation, blood in stool and abdominal distention.  Genitourinary: Denies dysuria, urgency, frequency, hematuria, flank pain and difficulty urinating.  Endocrine: Denies: hot or cold intolerance, sweats, changes in hair or nails, polyuria,  polydipsia. Musculoskeletal: Denies myalgias, back pain, joint swelling, arthralgias and gait problem.  Skin: Denies pallor, rash and wound.  Neurological: Denies dizziness, seizures, syncope, weakness, light-headedness, numbness and headaches.  Hematological: Denies adenopathy. Easy bruising, personal or family bleeding history  Psychiatric/Behavioral: Denies suicidal ideation, mood changes, confusion, nervousness, sleep disturbance and agitation    Physical Exam: Vitals:   07/22/20 1023  BP: (!) 142/76  Pulse: (!) 59  Temp: 97.8 F (36.6 C)  TempSrc: Oral  SpO2: 96%  Weight: 200 lb 12.8 oz (91.1 kg)  Height: 5\' 9"  (1.753 m)    Body mass index is 29.65 kg/m.   Constitutional: NAD, calm, comfortable Eyes: PERRL, lids and conjunctivae normal ENMT: Mucous membranes are moist. Respiratory: clear to auscultation bilaterally, no wheezing, no crackles. Normal respiratory effort. No accessory muscle use.  Cardiovascular: Regular rate and rhythm, no murmurs /  rubs / gallops. No extremity edema.  Psychiatric:  Alert and oriented x 3. Normal mood.    Impression and Plan:  Insomnia, unspecified type GAD (generalized anxiety disorder) -Both of these issues have improved since discontinuing lorazepam and taking a full instead of 1/2 tablet of Lexapro 5 mg at bedtime.   Patient Instructions  -Nice seeing you today!!  -See you back in 6 months or sooner as needed.      Lelon Frohlich, MD [facility]

## 2020-07-22 NOTE — Patient Instructions (Signed)
-  Nice seeing you today!!  -See you back in 6 months or sooner as needed.  

## 2020-08-26 ENCOUNTER — Telehealth: Payer: Self-pay | Admitting: Neurology

## 2020-08-26 ENCOUNTER — Ambulatory Visit (INDEPENDENT_AMBULATORY_CARE_PROVIDER_SITE_OTHER): Payer: Medicare Other | Admitting: Neurology

## 2020-08-26 ENCOUNTER — Encounter: Payer: Self-pay | Admitting: Neurology

## 2020-08-26 VITALS — BP 133/82 | HR 77 | Ht 69.0 in | Wt 205.0 lb

## 2020-08-26 DIAGNOSIS — R413 Other amnesia: Secondary | ICD-10-CM

## 2020-08-26 DIAGNOSIS — E538 Deficiency of other specified B group vitamins: Secondary | ICD-10-CM | POA: Diagnosis not present

## 2020-08-26 DIAGNOSIS — F0280 Dementia in other diseases classified elsewhere without behavioral disturbance: Secondary | ICD-10-CM | POA: Diagnosis not present

## 2020-08-26 DIAGNOSIS — G309 Alzheimer's disease, unspecified: Secondary | ICD-10-CM | POA: Diagnosis not present

## 2020-08-26 HISTORY — DX: Dementia in other diseases classified elsewhere, unspecified severity, without behavioral disturbance, psychotic disturbance, mood disturbance, and anxiety: F02.80

## 2020-08-26 MED ORDER — DONEPEZIL HCL 5 MG PO TABS: 5.0000 mg | ORAL_TABLET | Freq: Every day | ORAL | 1 refills | Status: DC

## 2020-08-26 NOTE — Telephone Encounter (Signed)
Medicare/[place] of [place] order sent to [facility]. No auth they will reach out to the patient to schedule.  

## 2020-08-26 NOTE — Progress Notes (Signed)
Reason for visit: Memory disturbance  Referring physician: Dr. Virgie Dad is a 87 y.o. male  History of present illness:  [name] is an 87 year old right-handed white male with a history of a memory disturbance that dates back at least 8 months.  The patient comes in today with his wife.  Apparently the patient has 7th grade education, he has never done a lot of things for himself such as keep up with medications or do the finances.  However, he has had some gradual change in general memory recently, he has difficulty remembering names for people.  He does not repeat himself but he does misplace things about the house.  He still operates a motor vehicle but occasionally he may have difficulty with directions.  The wife currently does most of the driving now.  The patient over the last 3 months has had some visual hallucinations, he may see people inside the house who are not there.  The patient does not have a lot of problems with agitation.  He has had troubles with anxiety and insomnia, he was given a prescription for Lexapro taking 10 mg in the evening which has helped his ability to sleep better.  The patient does have some urinary frequency, he has some mild gait instability but no recent falls.  He denies any headaches or dizziness.  Does report some occasional hand numbness but denies any weakness.  He denies any family history of memory problems.  He is sent to this office for further evaluation.  Past Medical History:  Diagnosis Date  . Arthritis    diffuse  . BPH (benign prostatic hypertrophy)    Dr. Risa Grill monitors  elevated PSA  . Chronic lower back pain 05-10-12   States pain is related to hip  . Colon polyp    2006 and 2010  . Esophageal stricture   . GERD (gastroesophageal reflux disease)   . Hyperlipidemia    NMR  2007  . Hypertension   . Nasal polyps   . Subclavian steal syndrome 1997    Past Surgical History:  Procedure Laterality Date  . BACK  SURGERY  05-10-12   lumbar fusion with hardware  . CATARACT EXTRACTION W/ INTRAOCULAR LENS  IMPLANT, BILATERAL    . COLONOSCOPY W/ POLYPECTOMY  2006 & 2010  . ESOPHAGEAL DILATION  2003  . GANGLION CYST EXCISION  1999   LUE  . GANGLION CYST EXCISION  05/14/2012   Procedure: REMOVAL GANGLION OF WRIST;  Surgeon: Mcarthur Rossetti, MD;  Location: [facility];  Service: Orthopedics;  Laterality: Right;  Excision Right Wrist Ganglion Cyst  . JOINT REPLACEMENT    . KNEE ARTHROSCOPY  2001   left  . POSTERIOR FUSION LUMBAR SPINE  02/08/12   L2-L4  . PROSTATE BIOPSY  2000   Dr. Rosana Hoes  . [place]   right side  . TOTAL HIP ARTHROPLASTY  05/14/2012   Procedure: TOTAL HIP ARTHROPLASTY ANTERIOR APPROACH;  Surgeon: Mcarthur Rossetti, MD;  Location: [facility];  Service: Orthopedics;  Laterality: Right;  Right Total Hip Arthroplasty, Anterior Approach (C-Arm)  . TOTAL SHOULDER ARTHROPLASTY  2010   Dr. Mardelle Matte; right    Family History  Problem Relation Age of Onset  . Sudden death Father        murdered  . Hypertension Mother   . Skin cancer Mother   . Skin cancer Brother   . Hypertension Brother   . Coronary artery disease  Brother        CABG  . Colon cancer Paternal Uncle   . Breast cancer Paternal Grandmother   . Breast cancer Paternal Aunt   . Colon cancer Paternal Aunt   . Heart attack Paternal Uncle   . Kidney disease Neg Hx   . Diabetes Neg Hx   . Alzheimer's disease Neg Hx   . Dementia Neg Hx     Social history:  reports that he quit smoking about 47 years ago. His smoking use included cigarettes. He has a 16.50 pack-year smoking history. He quit smokeless tobacco use about 46 years ago.  His smokeless tobacco use included chew. He reports that he does not drink alcohol and does not use drugs.  Medications:  Prior to Admission medications   Medication Sig Start Date End Date Taking? Authorizing Provider  acetaminophen (TYLENOL) 500 MG tablet Take 500 mg  by mouth every 6 (six) hours as needed. For pain   Yes [provider]  albuterol (PROVENTIL HFA;VENTOLIN HFA) 108 (90 Base) MCG/ACT inhaler Inhale 2 puffs into the lungs every 6 (six) hours as needed for wheezing or shortness of breath. 10/01/18  Yes Martin[place], Betty G, MD  Calcium Carbonate-Vitamin D (CALTRATE 600+D PO) Take 1 tablet by mouth 2 (two) times daily.    Yes [provider]  escitalopram (LEXAPRO) 10 MG tablet Take 10 mg by mouth daily.   Yes [provider]  LORazepam (ATIVAN) 1 MG tablet Take 1 tablet (1 mg total) by mouth at bedtime as needed for anxiety. 06/06/20  Yes Isla Pence, MD  losartan (COZAAR) 25 MG tablet TAKE 1 TABLET EVERY DAY (NEED MD APPOINTMENT) 04/22/20  Yes Isaac Bliss, Rayford Halsted, MD  montelukast (SINGULAIR) 10 MG tablet TAKE 1 TABLET AT BEDTIME 04/22/20  Yes Isaac Bliss, Rayford Halsted, MD  pantoprazole (PROTONIX) 40 MG tablet TAKE 1 TABLET EVERY DAY 04/22/20  Yes Isaac Bliss, Rayford Halsted, MD  polyethylene glycol [facility] / GLYCOLAX) packet Take 17 g by mouth 2 (two) times daily.    Yes [provider]  pravastatin (PRAVACHOL) 20 MG tablet TAKE 1 TABLET AT BEDTIME 04/22/20  Yes Isaac Bliss, Rayford Halsted, MD  tamsulosin (FLOMAX) 0.4 MG CAPS capsule Take 1 capsule (0.4 mg total) by mouth at bedtime. 12/09/18  Yes Erline Hau, MD      Allergies  Allergen Reactions  . Aspirin Rash and Other (See Comments)    Dyspnea, eyelid edema, truncal urticaria--Patient tolerates other NSAIDS at home Because of a history of documented adverse serious drug reaction;Medi Alert bracelet  is recommended Other reaction(s): Other (See Comments) Dyspnea, eyelid edema, truncal urticaria--Patient tolerates other NSAIDS at home Because of a history of documented adverse serious drug reaction;Medi Alert braceletis recommended  Wife states pt is highly allergic     ROS:  Out of a complete 14 system review of symptoms, the  patient complains only of the following symptoms, and all other reviewed systems are negative.  Memory problems Urinary frequency  Blood pressure 133/82, pulse 77, height 5\' 9"  (1.753 m), weight 205 lb (93 kg).  Physical Exam  General: The patient is alert and cooperative at the time of the examination.  Eyes: Pupils are equal, round, and reactive to light. Discs are flat bilaterally.  Neck: The neck is supple, no carotid bruits are noted.  Respiratory: The respiratory examination is clear.  Cardiovascular: The cardiovascular examination reveals a regular rate and rhythm, no obvious murmurs or rubs are noted.  Skin:  Extremities are without significant edema.  Neurologic Exam  Mental status: The patient is alert and oriented x 3 at the time of the examination. The Mini-Mental status examination done today shows a total score 14/30.  Cranial nerves: Facial symmetry is present. There is good sensation of the face to pinprick and soft touch bilaterally. The strength of the facial muscles and the muscles to head turning and shoulder shrug are normal bilaterally. Speech is well enunciated, no aphasia or dysarthria is noted. Extraocular movements are full. Visual fields are full. The tongue is midline, and the patient has symmetric elevation of the soft palate. No obvious hearing deficits are noted.  Motor: The motor testing reveals 5 over 5 strength of all 4 extremities. Good symmetric motor tone is noted throughout.  Sensory: Sensory testing is intact to pinprick, soft touch, vibration sensation, and position sense on all 4 extremities, unfortunately the patient seems to perseverate with some of his answers on sensory testing. No evidence of extinction is noted.  Coordination: Cerebellar testing reveals good finger-nose-finger bilaterally.  The patient has apraxia with heel-to-shin bilaterally.  Gait and station: Gait is normal for age.  The patient could not perform tandem gait. Romberg  is negative. No drift is seen.  Reflexes: Deep tendon reflexes are symmetric and normal bilaterally, with exception of depression of ankle jerk reflexes bilaterally. Toes are downgoing bilaterally.   Assessment/Plan:  1.  Memory disturbance, probable Alzheimer's disease  The patient will be set up for blood work today, he will have a CT scan of the brain.  We will initiate treatment with Aricept, they will call in 1 month if he is tolerating the 5 mg dosing and we will go up to the 10 mg maintenance dose.  He will follow up in 6 months.  We discussed cessation of driving at this point.  Jill Alexanders MD 08/26/2020 10:55 AM  [facility] [address] [place], [place] [address]  Phone [phone] Fax [phone]

## 2020-08-26 NOTE — Patient Instructions (Signed)
We will start aricept at night for the memory.  Begin Aricept (donepezil) at 5 mg at night for one month. If this medication is well-tolerated, please call our office and we will call in a prescription for the 10 mg tablets. Look out for side effects that may include nausea, diarrhea, weight loss, or stomach cramps. This medication will also cause a runny nose, therefore there is no need for allergy medications for this purpose.  

## 2020-08-27 LAB — VITAMIN B12: Vitamin B-12: 394 pg/mL (ref 232–1245)

## 2020-08-27 LAB — RPR: RPR Ser Ql: NONREACTIVE

## 2020-08-27 LAB — SEDIMENTATION RATE: Sed Rate: 3 mm/hr (ref 0–30)

## 2020-08-30 DIAGNOSIS — Z09 Encounter for follow-up examination after completed treatment for conditions other than malignant neoplasm: Secondary | ICD-10-CM | POA: Diagnosis not present

## 2020-08-30 DIAGNOSIS — Z8709 Personal history of other diseases of the respiratory system: Secondary | ICD-10-CM | POA: Diagnosis not present

## 2020-09-10 ENCOUNTER — Other Ambulatory Visit: Payer: Self-pay

## 2020-09-10 ENCOUNTER — Ambulatory Visit
Admission: RE | Admit: 2020-09-10 | Discharge: 2020-09-10 | Disposition: A | Discharge status: Home / Self Care | Payer: Medicare Other | Source: Ambulatory Visit | Attending: Neurology | Admitting: Neurology

## 2020-09-10 DIAGNOSIS — R413 Other amnesia: Secondary | ICD-10-CM

## 2020-09-12 ENCOUNTER — Telehealth: Payer: Self-pay | Admitting: Neurology

## 2020-09-12 MED ORDER — DONEPEZIL HCL 10 MG PO TABS: 10.0000 mg | ORAL_TABLET | Freq: Every day | ORAL | 3 refills | Status: DC

## 2020-09-12 NOTE — Telephone Encounter (Signed)
  I called the wife.  The CT of the head is relatively unremarkable, no significant cerebrovascular disease to explain the memory issue.  The patient is doing quite well on the 5 mg dose of the Aricept, I will send in the 10 mg tablets.   CT head 09/10/20:  IMPRESSION: 1. No acute intracranial findings. 2. Mild chronic microangiopathic changes of the cerebral white matter. 3. Chronic sinusitis.

## 2020-09-13 ENCOUNTER — Other Ambulatory Visit: Payer: Self-pay | Admitting: Internal Medicine

## 2020-09-21 ENCOUNTER — Telehealth: Payer: Self-pay | Admitting: Neurology

## 2020-09-21 MED ORDER — DONEPEZIL HCL 5 MG PO TABS: 5.0000 mg | ORAL_TABLET | Freq: Every day | ORAL | 1 refills | Status: DC

## 2020-09-21 NOTE — Telephone Encounter (Signed)
Pt's wife called stating that ever since the pt started taking donepezil (ARICEPT) 10 MG tablet he has not been able to eat or sleep much, is having stomach cramps and is talking and hallucinating a lot. Wife Massie Maroon on [place] would like to discuss with RN. Please advise.

## 2020-09-21 NOTE — Telephone Encounter (Signed)
I called the wife.  The patient tolerated the 5 mg dose of Aricept, but not the 10 mg dose.  We will stop the medication, when the patient begins to feel better, they can restart the 5 mg dosing, use of the 10 mg tablets by taking 1/2 tablet daily.

## 2020-09-21 NOTE — Telephone Encounter (Signed)
Attempted to call back for more information.

## 2020-09-22 NOTE — Telephone Encounter (Signed)
Pt.'s wife Letta Median states she never received vm from Dr. but was read message today by phone rep. from Dr.. She verbalized understanding & stated she will try Doctor's orders. Wife also states that pt. is very nervous, has belly pain, he's dizzy, has been hallucinating & he states he feels like he wants to die. Please advise.

## 2020-09-22 NOTE — Telephone Encounter (Signed)
I called back, left a message.  But they are not to restart the Aricept until the patient is feeling better, once he feels better, they can restart at the 5 mg dosing.

## 2020-09-23 ENCOUNTER — Other Ambulatory Visit: Payer: Self-pay

## 2020-09-23 ENCOUNTER — Encounter: Payer: Self-pay | Admitting: Internal Medicine

## 2020-09-23 ENCOUNTER — Ambulatory Visit (INDEPENDENT_AMBULATORY_CARE_PROVIDER_SITE_OTHER): Payer: Medicare Other | Admitting: Internal Medicine

## 2020-09-23 ENCOUNTER — Ambulatory Visit (INDEPENDENT_AMBULATORY_CARE_PROVIDER_SITE_OTHER): Payer: Medicare Other

## 2020-09-23 VITALS — BP 90/62 | HR 88 | Temp 97.4°F | Wt 194.4 lb

## 2020-09-23 DIAGNOSIS — R4182 Altered mental status, unspecified: Secondary | ICD-10-CM

## 2020-09-23 DIAGNOSIS — F0280 Dementia in other diseases classified elsewhere without behavioral disturbance: Secondary | ICD-10-CM

## 2020-09-23 DIAGNOSIS — R42 Dizziness and giddiness: Secondary | ICD-10-CM | POA: Diagnosis not present

## 2020-09-23 DIAGNOSIS — G309 Alzheimer's disease, unspecified: Secondary | ICD-10-CM

## 2020-09-23 DIAGNOSIS — R1030 Lower abdominal pain, unspecified: Secondary | ICD-10-CM

## 2020-09-23 DIAGNOSIS — R059 Cough, unspecified: Secondary | ICD-10-CM | POA: Diagnosis not present

## 2020-09-23 LAB — POCT URINALYSIS DIPSTICK
Bilirubin, UA: 2
Blood, UA: NEGATIVE
Glucose, UA: NEGATIVE
Ketones, UA: NEGATIVE
Leukocytes, UA: NEGATIVE
Nitrite, UA: NEGATIVE
Protein, UA: POSITIVE — AB
Spec Grav, UA: 1.025 (ref 1.010–1.025)
Urobilinogen, UA: 0.2 E.U./dL
pH, UA: 5.5 (ref 5.0–8.0)

## 2020-09-23 NOTE — Progress Notes (Signed)
Established Patient Office Visit     This visit occurred during the SARS-CoV-2 public health emergency.  Safety protocols were in place, including screening questions prior to the visit, additional usage of staff PPE, and extensive cleaning of exam room while observing appropriate contact time as indicated for disinfecting solutions.    CC/Reason for Visit: Acute confusion  HPI: Joshua Lopez is a 87 y.o. male who is coming in today for the above mentioned reasons.  He has been dealing with some memory issues now for some time.  He had his first visit with Dr. Jannifer Franklin, neurology and was started on Aricept at 5 mg which he tolerated well.  Shortly after increasing to 10 mg his wife notes that he started having dizziness, nausea, increased confusion.  They called Dr. Tobey Grim office and they were instructed to discontinue Aricept and to touch base with me.  He is noted to have a blood pressure of 90/62 today.  His wife states that he has been having cough for the past 4 days that is nonproductive, slightly increased shortness of breath, no fever, he has had some mild abdominal pain but has not been complaining of dysuria or urinary frequency.  He is having more difficulty sleeping.  He has had all 3 COVID vaccines.  Past Medical/Surgical History: Past Medical History:  Diagnosis Date  . Alzheimer disease ([place]) 08/26/2020  . Arthritis    diffuse  . BPH (benign prostatic hypertrophy)    Dr. Risa Grill monitors  elevated PSA  . Chronic lower back pain 05-10-12   States pain is related to hip  . Colon polyp    2006 and 2010  . Esophageal stricture   . GERD (gastroesophageal reflux disease)   . Hyperlipidemia    NMR  2007  . Hypertension   . Nasal polyps   . Subclavian steal syndrome 1997    Past Surgical History:  Procedure Laterality Date  . BACK SURGERY  05-10-12   lumbar fusion with hardware  . CATARACT EXTRACTION W/ INTRAOCULAR LENS  IMPLANT, BILATERAL    . COLONOSCOPY W/  POLYPECTOMY  2006 & 2010  . ESOPHAGEAL DILATION  2003  . GANGLION CYST EXCISION  1999   LUE  . GANGLION CYST EXCISION  05/14/2012   Procedure: REMOVAL GANGLION OF WRIST;  Surgeon: Mcarthur Rossetti, MD;  Location: [facility];  Service: Orthopedics;  Laterality: Right;  Excision Right Wrist Ganglion Cyst  . JOINT REPLACEMENT    . KNEE ARTHROSCOPY  2001   left  . POSTERIOR FUSION LUMBAR SPINE  02/08/12   L2-L4  . PROSTATE BIOPSY  2000   Dr. Rosana Hoes  . [place]   right side  . TOTAL HIP ARTHROPLASTY  05/14/2012   Procedure: TOTAL HIP ARTHROPLASTY ANTERIOR APPROACH;  Surgeon: Mcarthur Rossetti, MD;  Location: [facility];  Service: Orthopedics;  Laterality: Right;  Right Total Hip Arthroplasty, Anterior Approach (C-Arm)  . TOTAL SHOULDER ARTHROPLASTY  2010   Dr. Mardelle Matte; right    Social History:  reports that he quit smoking about 47 years ago. His smoking use included cigarettes. He has a 16.50 pack-year smoking history. He quit smokeless tobacco use about 46 years ago.  His smokeless tobacco use included chew. He reports that he does not drink alcohol and does not use drugs.  Allergies: Allergies  Allergen Reactions  . Aspirin Rash and Other (See Comments)    Dyspnea, eyelid edema, truncal urticaria--Patient tolerates other NSAIDS at home Because  of a history of documented adverse serious drug reaction;Medi Alert bracelet  is recommended Other reaction(s): Other (See Comments) Dyspnea, eyelid edema, truncal urticaria--Patient tolerates other NSAIDS at home Because of a history of documented adverse serious drug reaction;Medi Alert braceletis recommended  Wife states pt is highly allergic     Family History:  Family History  Problem Relation Age of Onset  . Sudden death Father        murdered  . Hypertension Mother   . Skin cancer Mother   . Skin cancer Brother   . Hypertension Brother   . Coronary artery disease Brother        CABG  . Colon cancer  Paternal Uncle   . Breast cancer Paternal Grandmother   . Breast cancer Paternal Aunt   . Colon cancer Paternal Aunt   . Heart attack Paternal Uncle   . Kidney disease Neg Hx   . Diabetes Neg Hx   . Alzheimer's disease Neg Hx   . Dementia Neg Hx      Current Outpatient Medications:  .  acetaminophen (TYLENOL) 500 MG tablet, Take 500 mg by mouth every 6 (six) hours as needed. For pain, Disp: , Rfl:  .  albuterol (PROVENTIL HFA;VENTOLIN HFA) 108 (90 Base) MCG/ACT inhaler, Inhale 2 puffs into the lungs every 6 (six) hours as needed for wheezing or shortness of breath., Disp: 1 Inhaler, Rfl: 0 .  Calcium Carbonate-Vitamin D (CALTRATE 600+D PO), Take 1 tablet by mouth 2 (two) times daily. , Disp: , Rfl:  .  donepezil (ARICEPT) 5 MG tablet, Take 1 tablet (5 mg total) by mouth at bedtime., Disp: 90 tablet, Rfl: 1 .  escitalopram (LEXAPRO) 10 MG tablet, Take 10 mg by mouth daily., Disp: , Rfl:  .  LORazepam (ATIVAN) 1 MG tablet, Take 1 tablet (1 mg total) by mouth at bedtime as needed for anxiety., Disp: 15 tablet, Rfl: 0 .  montelukast (SINGULAIR) 10 MG tablet, TAKE 1 TABLET AT BEDTIME, Disp: 90 tablet, Rfl: 1 .  pantoprazole (PROTONIX) 40 MG tablet, TAKE 1 TABLET EVERY DAY, Disp: 90 tablet, Rfl: 1 .  polyethylene glycol (MIRALAX / GLYCOLAX) packet, Take 17 g by mouth 2 (two) times daily. , Disp: , Rfl:  .  pravastatin (PRAVACHOL) 20 MG tablet, TAKE 1 TABLET AT BEDTIME, Disp: 90 tablet, Rfl: 1 .  tamsulosin (FLOMAX) 0.4 MG CAPS capsule, Take 1 capsule (0.4 mg total) by mouth at bedtime., Disp: 90 capsule, Rfl: 1  Review of Systems:  Constitutional: Denies fever, chills, diaphoresis, appetite change.  HEENT: Denies photophobia, eye pain, redness, hearing loss, ear pain, c mouth sores, trouble swallowing, neck pain, neck stiffness and tinnitus.   Respiratory: Denies  chest tightness,  and wheezing.   Cardiovascular: Denies chest pain, palpitations and leg swelling.  Gastrointestinal: Denies  nausea, vomiting, abdominal pain, diarrhea, constipation, blood in stool and abdominal distention.  Genitourinary: Denies dysuria, urgency, frequency, hematuria, flank pain and difficulty urinating.  Endocrine: Denies: hot or cold intolerance, sweats, changes in hair or nails, polyuria, polydipsia. Musculoskeletal: Denies myalgias, back pain, joint swelling, arthralgias and gait problem.  Skin: Denies pallor, rash and wound.  Neurological: Denies  seizures, syncope, numbness and headaches.  Hematological: Denies adenopathy. Easy bruising, personal or family bleeding history  Psychiatric/Behavioral: Denies suicidal ideation, mood changes, confusion, nervousness, sleep disturbance and agitation    Physical Exam: Vitals:   09/23/20 1455  BP: 90/62  Pulse: 88  Temp: (!) 97.4 F (36.3 C)  TempSrc: Oral  SpO2:  97%  Weight: 194 lb 6.4 oz (88.2 kg)    Body mass index is 28.71 kg/m.   Constitutional: NAD, calm, comfortable Eyes: PERRL, lids and conjunctivae normal ENMT: Mucous membranes are moist.  Respiratory: clear to auscultation bilaterally, no wheezing, no crackles. Normal respiratory effort. No accessory muscle use.  Cardiovascular: Regular rate and rhythm, no murmurs / rubs / gallops. No extremity edema.  Psychiatric: Normal judgment and insight. Alert and oriented x 3. Normal mood.    Impression and Plan:  Lower abdominal pain  Acute on chronic alteration in mental status  Alzheimer disease ([place]) Dizziness  -Since symptoms started shortly after initiating higher Aricept dosing, I suspect this is the etiology.  Nonetheless I will rule out infectious sources.  Given his shortness of breath and coughing I have advised his wife to get a Covid test despite his full vaccination status.  We will do a chest x-ray in office today.  Point-of-care urine dipstick is negative, we will send for urine analysis and culture. -Assuming these tests are negative, have advised follow-up with  neurology. -Because of blood pressure of 90/62, I have advised them to discontinue losartan for now in case this is contributing to dizziness.     Lelon Frohlich, MD [facility]

## 2020-09-23 NOTE — Telephone Encounter (Signed)
Called Joshua Lopez back.  She said the belly pain, dizziness, hallucinating just started Tuesday Night/Wednesday morning.  Encouraged her to call PCP for work up to eliminate all sources of infection, UTI, etc.  She is going to call and follow back up after he sees PCP.

## 2020-09-24 LAB — URINALYSIS
Hgb urine dipstick: NEGATIVE
Ketones, ur: NEGATIVE
Leukocytes,Ua: NEGATIVE
Nitrite: NEGATIVE
Specific Gravity, Urine: >=1.03 — AB (ref 1.000–1.030)
Urine Glucose: NEGATIVE
Urobilinogen, UA: 0.2 (ref 0.0–1.0)
pH: 5.5 (ref 5.0–8.0)

## 2020-10-14 ENCOUNTER — Other Ambulatory Visit: Payer: Self-pay | Admitting: Internal Medicine

## 2020-10-26 ENCOUNTER — Encounter: Payer: Medicare Other | Admitting: Internal Medicine

## 2020-11-04 ENCOUNTER — Ambulatory Visit (INDEPENDENT_AMBULATORY_CARE_PROVIDER_SITE_OTHER): Payer: Medicare Other | Admitting: Internal Medicine

## 2020-11-04 ENCOUNTER — Other Ambulatory Visit: Payer: Self-pay

## 2020-11-04 ENCOUNTER — Encounter: Payer: Self-pay | Admitting: Internal Medicine

## 2020-11-04 VITALS — BP 102/68 | HR 77 | Temp 97.5°F | Ht 69.0 in | Wt 199.4 lb

## 2020-11-04 DIAGNOSIS — Z0000 Encounter for general adult medical examination without abnormal findings: Secondary | ICD-10-CM

## 2020-11-04 DIAGNOSIS — E782 Mixed hyperlipidemia: Secondary | ICD-10-CM | POA: Diagnosis not present

## 2020-11-04 DIAGNOSIS — F411 Generalized anxiety disorder: Secondary | ICD-10-CM | POA: Diagnosis not present

## 2020-11-04 DIAGNOSIS — F0280 Dementia in other diseases classified elsewhere without behavioral disturbance: Secondary | ICD-10-CM

## 2020-11-04 DIAGNOSIS — I10 Essential (primary) hypertension: Secondary | ICD-10-CM

## 2020-11-04 DIAGNOSIS — G309 Alzheimer's disease, unspecified: Secondary | ICD-10-CM

## 2020-11-04 LAB — CBC WITH DIFFERENTIAL/PLATELET
Basophils Absolute: 0 10*3/uL (ref 0.0–0.1)
Basophils Relative: 0.9 % (ref 0.0–3.0)
Eosinophils Absolute: 0.3 10*3/uL (ref 0.0–0.7)
Eosinophils Relative: 4.7 % (ref 0.0–5.0)
HCT: 39.8 % (ref 39.0–52.0)
Hemoglobin: 13.7 g/dL (ref 13.0–17.0)
Lymphocytes Relative: 27.7 % (ref 12.0–46.0)
Lymphs Abs: 1.5 10*3/uL (ref 0.7–4.0)
MCHC: 34.4 g/dL (ref 30.0–36.0)
MCV: 92.9 fl (ref 78.0–100.0)
Monocytes Absolute: 0.4 10*3/uL (ref 0.1–1.0)
Monocytes Relative: 7.5 % (ref 3.0–12.0)
Neutro Abs: 3.2 10*3/uL (ref 1.4–7.7)
Neutrophils Relative %: 59.2 % (ref 43.0–77.0)
Platelets: 160 10*3/uL (ref 150.0–400.0)
RBC: 4.28 Mil/uL (ref 4.22–5.81)
RDW: 13.7 % (ref 11.5–15.5)
WBC: 5.4 10*3/uL (ref 4.0–10.5)

## 2020-11-04 LAB — LIPID PANEL
Cholesterol: 137 mg/dL (ref 0–200)
HDL: 58 mg/dL (ref >39.00)
LDL Cholesterol: 58 mg/dL (ref 0–99)
NonHDL: 78.51
Total CHOL/HDL Ratio: 2
Triglycerides: 103 mg/dL (ref 0.0–149.0)
VLDL: 20.6 mg/dL (ref 0.0–40.0)

## 2020-11-04 LAB — COMPREHENSIVE METABOLIC PANEL
ALT: 12 U/L (ref 0–53)
AST: 13 U/L (ref 0–37)
Albumin: 4 g/dL (ref 3.5–5.2)
Alkaline Phosphatase: 76 U/L (ref 39–117)
BUN: 25 mg/dL — ABNORMAL HIGH (ref 6–23)
CO2: 28 mEq/L (ref 19–32)
Calcium: 8.9 mg/dL (ref 8.4–10.5)
Chloride: 105 mEq/L (ref 96–112)
Creatinine, Ser: 1.19 mg/dL (ref 0.40–1.50)
GFR: 54.53 mL/min — ABNORMAL LOW (ref >60.00)
Glucose, Bld: 97 mg/dL (ref 70–99)
Potassium: 4 mEq/L (ref 3.5–5.1)
Sodium: 140 mEq/L (ref 135–145)
Total Bilirubin: 1 mg/dL (ref 0.2–1.2)
Total Protein: 6.3 g/dL (ref 6.0–8.3)

## 2020-11-04 MED ORDER — DONEPEZIL HCL 5 MG PO TABS: 5.0000 mg | ORAL_TABLET | Freq: Every day | ORAL | 1 refills | Status: DC

## 2020-11-04 NOTE — Patient Instructions (Signed)
-Nice seeing you today!!  -Lab work today; will notify you once results are available.  -Remember to get your COVID booster and to schedule your eye exam.  -Schedule follow up in 6 months or sooner if needed.   Preventive Care 85 Years and Older, Male Preventive care refers to lifestyle choices and visits with your health care provider that can promote health and wellness. This includes:  A yearly physical exam. This is also called an annual wellness visit.  Regular dental and eye exams.  Immunizations.  Screening for certain conditions.  Healthy lifestyle choices, such as: ? Eating a healthy diet. ? Getting regular exercise. ? Not using drugs or products that contain nicotine and tobacco. ? Limiting alcohol use. What can I expect for my preventive care visit? Physical exam Your health care provider will check your:  Height and weight. These may be used to calculate your BMI (body mass index). BMI is a measurement that tells if you are at a healthy weight.  Heart rate and blood pressure.  Body temperature.  Skin for abnormal spots. Counseling Your health care provider may ask you questions about your:  Past medical problems.  Family's medical history.  Alcohol, tobacco, and drug use.  Emotional well-being.  Home life and relationship well-being.  Sexual activity.  Diet, exercise, and sleep habits.  History of falls.  Memory and ability to understand (cognition).  Work and work Statistician.  Access to firearms. What immunizations do I need? Vaccines are usually given at various ages, according to a schedule. Your health care provider will recommend vaccines for you based on your age, medical history, and lifestyle or other factors, such as travel or where you work.   What tests do I need? Blood tests  Lipid and cholesterol levels. These may be checked every 5 years, or more often depending on your overall health.  Hepatitis C test.  Hepatitis B  test. Screening  Lung cancer screening. You may have this screening every year starting at age 85 if you have a 30-pack-year history of smoking and currently smoke or have quit within the past 15 years.  Colorectal cancer screening. ? All adults should have this screening starting at age 85 and continuing until age 85. ? Your health care provider may recommend screening at age 85 if you are at increased risk. ? You will have tests every 1-10 years, depending on your results and the type of screening test.  Prostate cancer screening. Recommendations will vary depending on your family history and other risks.  Genital exam to check for testicular cancer or hernias.  Diabetes screening. ? This is done by checking your blood sugar (glucose) after you have not eaten for a while (fasting). ? You may have this done every 1-3 years.  Abdominal aortic aneurysm (AAA) screening. You may need this if you are a current or former smoker.  STD (sexually transmitted disease) testing, if you are at risk. Follow these instructions at home: Eating and drinking  Eat a diet that includes fresh fruits and vegetables, whole grains, lean protein, and low-fat dairy products. Limit your intake of foods with high amounts of sugar, saturated fats, and salt.  Take vitamin and mineral supplements as recommended by your health care provider.  Do not drink alcohol if your health care provider tells you not to drink.  If you drink alcohol: ? Limit how much you have to 0-2 drinks a day. ? Be aware of how much alcohol is in your drink. In  the [place] one drink equals one 12 oz bottle of beer (355 mL), one 5 oz glass of wine (148 mL), or one 1 oz glass of hard liquor (44 mL).   Lifestyle  Take daily care of your teeth and gums. Brush your teeth every morning and night with fluoride toothpaste. Floss one time each day.  Stay active. Exercise for at least 30 minutes 5 or more days each week.  Do not use any products  that contain nicotine or tobacco, such as cigarettes, e-cigarettes, and chewing tobacco. If you need help quitting, ask your health care provider.  Do not use drugs.  If you are sexually active, practice safe sex. Use a condom or other form of protection to prevent STIs (sexually transmitted infections).  Talk with your health care provider about taking a low-dose aspirin or statin.  Find healthy ways to cope with stress, such as: ? Meditation, yoga, or listening to music. ? Journaling. ? Talking to a trusted person. ? Spending time with friends and family. Safety  Always wear your seat belt while driving or riding in a vehicle.  Do not drive: ? If you have been drinking alcohol. Do not ride with someone who has been drinking. ? When you are tired or distracted. ? While texting.  Wear a helmet and other protective equipment during sports activities.  If you have firearms in your house, make sure you follow all gun safety procedures. What's next?  Visit your health care provider once a year for an annual wellness visit.  Ask your health care provider how often you should have your eyes and teeth checked.  Stay up to date on all vaccines. This information is not intended to replace advice given to you by your health care provider. Make sure you discuss any questions you have with your health care provider. Document Revised: 04/08/2019 Document Reviewed: 07/04/2018 Elsevier Patient Education  2021 [employer].

## 2020-11-04 NOTE — Progress Notes (Addendum)
Established Patient Office Visit     This visit occurred during the SARS-CoV-2 public health emergency.  Safety protocols were in place, including screening questions prior to the visit, additional usage of staff PPE, and extensive cleaning of exam room while observing appropriate contact time as indicated for disinfecting solutions.    CC/Reason for Visit: Subsequent Medicare wellness visit and follow-up chronic medical conditions  HPI: Joshua Lopez is a 87 y.o. male who is coming in today for the above mentioned reasons. Past Medical History is significant for: Hypertension, hyperlipidemia, BPH, generalized anxiety disorder, GERD, Alzheimer's dementia followed by neurology.  He has been doing well.  He is here with his wife today, no new issues/complaints to report.  All immunizations are up-to-date except for his fourth COVID booster.  He no longer does cancer screening due to age.  He has done poorly on his vision screening today, he is currently not wearing prescription glasses.   Past Medical/Surgical History: Past Medical History:  Diagnosis Date  . Alzheimer disease ([place]) 08/26/2020  . Arthritis    diffuse  . BPH (benign prostatic hypertrophy)    Dr. Risa Grill monitors  elevated PSA  . Chronic lower back pain 05-10-12   States pain is related to hip  . Colon polyp    2006 and 2010  . Esophageal stricture   . GERD (gastroesophageal reflux disease)   . Hyperlipidemia    NMR  2007  . Hypertension   . Nasal polyps   . Subclavian steal syndrome 1997    Past Surgical History:  Procedure Laterality Date  . BACK SURGERY  05-10-12   lumbar fusion with hardware  . CATARACT EXTRACTION W/ INTRAOCULAR LENS  IMPLANT, BILATERAL    . COLONOSCOPY W/ POLYPECTOMY  2006 & 2010  . ESOPHAGEAL DILATION  2003  . GANGLION CYST EXCISION  1999   LUE  . GANGLION CYST EXCISION  05/14/2012   Procedure: REMOVAL GANGLION OF WRIST;  Surgeon: Mcarthur Rossetti, MD;  Location: [facility];   Service: Orthopedics;  Laterality: Right;  Excision Right Wrist Ganglion Cyst  . JOINT REPLACEMENT    . KNEE ARTHROSCOPY  2001   left  . POSTERIOR FUSION LUMBAR SPINE  02/08/12   L2-L4  . PROSTATE BIOPSY  2000   Dr. Rosana Hoes  . [place]   right side  . TOTAL HIP ARTHROPLASTY  05/14/2012   Procedure: TOTAL HIP ARTHROPLASTY ANTERIOR APPROACH;  Surgeon: Mcarthur Rossetti, MD;  Location: [facility];  Service: Orthopedics;  Laterality: Right;  Right Total Hip Arthroplasty, Anterior Approach (C-Arm)  . TOTAL SHOULDER ARTHROPLASTY  2010   Dr. Mardelle Matte; right    Social History:  reports that he quit smoking about 47 years ago. His smoking use included cigarettes. He has a 16.50 pack-year smoking history. He quit smokeless tobacco use about 46 years ago.  His smokeless tobacco use included chew. He reports that he does not drink alcohol and does not use drugs.  Allergies: Allergies  Allergen Reactions  . Aspirin Rash and Other (See Comments)    Dyspnea, eyelid edema, truncal urticaria--Patient tolerates other NSAIDS at home Because of a history of documented adverse serious drug reaction;Medi Alert bracelet  is recommended Other reaction(s): Other (See Comments) Dyspnea, eyelid edema, truncal urticaria--Patient tolerates other NSAIDS at home Because of a history of documented adverse serious drug reaction;Medi Alert braceletis recommended  Wife states pt is highly allergic     Family History:  Family History  Problem Relation Age of Onset  . Sudden death Father        murdered  . Hypertension Mother   . Skin cancer Mother   . Skin cancer Brother   . Hypertension Brother   . Coronary artery disease Brother        CABG  . Colon cancer Paternal Uncle   . Breast cancer Paternal Grandmother   . Breast cancer Paternal Aunt   . Colon cancer Paternal Aunt   . Heart attack Paternal Uncle   . Kidney disease Neg Hx   . Diabetes Neg Hx   . Alzheimer's disease Neg Hx    . Dementia Neg Hx      Current Outpatient Medications:  .  acetaminophen (TYLENOL) 500 MG tablet, Take 500 mg by mouth every 6 (six) hours as needed. For pain, Disp: , Rfl:  .  albuterol (PROVENTIL HFA;VENTOLIN HFA) 108 (90 Base) MCG/ACT inhaler, Inhale 2 puffs into the lungs every 6 (six) hours as needed for wheezing or shortness of breath., Disp: 1 Inhaler, Rfl: 0 .  Calcium Carbonate-Vitamin D (CALTRATE 600+D PO), Take 1 tablet by mouth 2 (two) times daily. , Disp: , Rfl:  .  escitalopram (LEXAPRO) 10 MG tablet, Take 10 mg by mouth daily., Disp: , Rfl:  .  escitalopram (LEXAPRO) 5 MG tablet, TAKE 1 TABLET EVERY DAY, Disp: 90 tablet, Rfl: 1 .  LORazepam (ATIVAN) 1 MG tablet, Take 1 tablet (1 mg total) by mouth at bedtime as needed for anxiety., Disp: 15 tablet, Rfl: 0 .  montelukast (SINGULAIR) 10 MG tablet, TAKE 1 TABLET AT BEDTIME, Disp: 90 tablet, Rfl: 1 .  pantoprazole (PROTONIX) 40 MG tablet, TAKE 1 TABLET EVERY DAY, Disp: 90 tablet, Rfl: 1 .  polyethylene glycol (MIRALAX / GLYCOLAX) packet, Take 17 g by mouth 2 (two) times daily. , Disp: , Rfl:  .  pravastatin (PRAVACHOL) 20 MG tablet, TAKE 1 TABLET AT BEDTIME, Disp: 90 tablet, Rfl: 1 .  tamsulosin (FLOMAX) 0.4 MG CAPS capsule, Take 1 capsule (0.4 mg total) by mouth at bedtime., Disp: 90 capsule, Rfl: 1 .  donepezil (ARICEPT) 5 MG tablet, Take 1 tablet (5 mg total) by mouth at bedtime., Disp: 90 tablet, Rfl: 1  Review of Systems:  Constitutional: Denies fever, chills, diaphoresis, appetite change and fatigue.  HEENT: Denies photophobia, eye pain, redness, hearing loss, ear pain, congestion, sore throat, rhinorrhea, sneezing, mouth sores, trouble swallowing, neck pain, neck stiffness and tinnitus.   Respiratory: Denies SOB, DOE, cough, chest tightness,  and wheezing.   Cardiovascular: Denies chest pain, palpitations and leg swelling.  Gastrointestinal: Denies nausea, vomiting, abdominal pain, diarrhea, constipation, blood in stool and  abdominal distention.  Genitourinary: Denies dysuria, urgency, frequency, hematuria, flank pain and difficulty urinating.  Endocrine: Denies: hot or cold intolerance, sweats, changes in hair or nails, polyuria, polydipsia. Musculoskeletal: Denies myalgias, back pain, joint swelling, arthralgias and gait problem.  Skin: Denies pallor, rash and wound.  Neurological: Denies dizziness, seizures, syncope, weakness, light-headedness, numbness and headaches.  Hematological: Denies adenopathy. Easy bruising, personal or family bleeding history  Psychiatric/Behavioral: Denies suicidal ideation, mood changes, confusion, nervousness, sleep disturbance and agitation    Physical Exam: Vitals:   11/04/20 0753  BP: 102/68  Pulse: 77  Temp: (!) 97.5 F (36.4 C)  TempSrc: Oral  SpO2: 98%  Weight: 199 lb 6.4 oz (90.4 kg)    Body mass index is 29.45 kg/m.   Constitutional: NAD, calm, comfortable Eyes: PERRL, lids and conjunctivae normal ENMT: Mucous  membranes are moist. Posterior pharynx clear of any exudate or lesions. Normal dentition. Tympanic membrane is pearly white, no erythema or bulging. Neck: normal, supple, no masses, no thyromegaly Respiratory: clear to auscultation bilaterally, no wheezing, no crackles. Normal respiratory effort. No accessory muscle use.  Cardiovascular: Regular rate and rhythm, no murmurs / rubs / gallops. No extremity edema. 2+ pedal pulses.  Abdomen: no tenderness, no masses palpated. No hepatosplenomegaly. Bowel sounds positive.  Musculoskeletal: no clubbing / cyanosis. No joint deformity upper and lower extremities. Good ROM, no contractures. Normal muscle tone.  Skin: no rashes, lesions, ulcers. No induration Neurologic: CN 2-12 grossly intact. Sensation intact, DTR normal. Strength 5/5 in all 4.  Psychiatric: Poor judgment and insight. Alert and oriented x 2, oriented to person and place, not to time. Normal mood.    Subsequent Medicare wellness visit   1.  Risk factors, based on past  M,S,F -cardiovascular disease risk factors include age, gender, history of hypertension, history of hyperlipidemia   2.  Physical activities: Very sedentary   3.  Depression/mood:  Stable, not depressed   4.  Hearing:  Decreased hearing issues which are chronic   5.  ADL's: Moderately dependent for ADLs   6.  Fall risk:  Low fall risk   7.  Home safety: No problems identified   8.  Height weight, and visual acuity: height and weight as above, vision:   Visual Acuity Screening   Right eye Left eye Both eyes  Without correction: 20/32 20/50 20/32   With correction:        9.  Counseling:  Advised to follow-up with his eye doctor as well as to obtain his fourth Covid vaccine.   10. Lab orders based on risk factors: Laboratory update will be reviewed   11. Referral :  None today   12. Care plan:  Follow-up with me in 6 months   13. Cognitive assessment:  He has been diagnosed with Alzheimer's dementia   14. Screening: Patient provided with a written and personalized 5-10 year screening schedule in the AVS.   yes   15. Provider List Update:   PCP, neurologist Dr. Jannifer Franklin  16. Advance Directives: Full code   17. Opioids: Patient is not on any opioid prescriptions and has no risk factors for a substance use disorder.   [place] Office Visit from 10/21/2019 in [facility]  PHQ-9 Total Score 0      Fall Risk  10/21/2019 10/10/2018 07/11/2018 02/12/2017 09/27/2015  Falls in the past year? 0 0 0 No No  Comment - - - [place] Telephone Survey: data to providers prior to load -  Number falls in past yr: 0 0 0 - -  Injury with Fall? 0 0 0 - -     Impression and Plan:  Medicare annual wellness visit, subsequent -He has routine dental care, he is now due for his eye exam, he is doing poorly in his vision screening today. -He is due to get his fourth Covid vaccine, otherwise immunizations are up-to-date. -Screening labs  today. -Healthy lifestyle discussed in detail. -He has elected to defer all further cancer screening due to age and comorbidities, I agree.  Alzheimer disease ([place])  - Plan: donepezil (ARICEPT) 5 MG tablet -Followed by neurology.  GAD (generalized anxiety disorder) -Stable on Lexapro 10 mg daily.  Essential hypertension  - Plan: CBC with Differential/Platelet, Comprehensive metabolic panel -Blood pressure is currently well controlled, he was taken off medication due to hypotension.  HYPERLIPIDEMIA  - Plan: Lipid panel, on pravastatin.    Patient Instructions   -Nice seeing you today!!  -Lab work today; will notify you once results are available.  -Remember to get your COVID booster and to schedule your eye exam.  -Schedule follow up in 6 months or sooner if needed.   Preventive Care 23 Years and Older, Male Preventive care refers to lifestyle choices and visits with your health care provider that can promote health and wellness. This includes:  A yearly physical exam. This is also called an annual wellness visit.  Regular dental and eye exams.  Immunizations.  Screening for certain conditions.  Healthy lifestyle choices, such as: ? Eating a healthy diet. ? Getting regular exercise. ? Not using drugs or products that contain nicotine and tobacco. ? Limiting alcohol use. What can I expect for my preventive care visit? Physical exam Your health care provider will check your:  Height and weight. These may be used to calculate your BMI (body mass index). BMI is a measurement that tells if you are at a healthy weight.  Heart rate and blood pressure.  Body temperature.  Skin for abnormal spots. Counseling Your health care provider may ask you questions about your:  Past medical problems.  Family's medical history.  Alcohol, tobacco, and drug use.  Emotional well-being.  Home life and relationship well-being.  Sexual activity.  Diet, exercise, and  sleep habits.  History of falls.  Memory and ability to understand (cognition).  Work and work Statistician.  Access to firearms. What immunizations do I need? Vaccines are usually given at various ages, according to a schedule. Your health care provider will recommend vaccines for you based on your age, medical history, and lifestyle or other factors, such as travel or where you work.   What tests do I need? Blood tests  Lipid and cholesterol levels. These may be checked every 5 years, or more often depending on your overall health.  Hepatitis C test.  Hepatitis B test. Screening  Lung cancer screening. You may have this screening every year starting at age 65 if you have a 30-pack-year history of smoking and currently smoke or have quit within the past 15 years.  Colorectal cancer screening. ? All adults should have this screening starting at age 36 and continuing until age 55. ? Your health care provider may recommend screening at age 28 if you are at increased risk. ? You will have tests every 1-10 years, depending on your results and the type of screening test.  Prostate cancer screening. Recommendations will vary depending on your family history and other risks.  Genital exam to check for testicular cancer or hernias.  Diabetes screening. ? This is done by checking your blood sugar (glucose) after you have not eaten for a while (fasting). ? You may have this done every 1-3 years.  Abdominal aortic aneurysm (AAA) screening. You may need this if you are a current or former smoker.  STD (sexually transmitted disease) testing, if you are at risk. Follow these instructions at home: Eating and drinking  Eat a diet that includes fresh fruits and vegetables, whole grains, lean protein, and low-fat dairy products. Limit your intake of foods with high amounts of sugar, saturated fats, and salt.  Take vitamin and mineral supplements as recommended by your health care  provider.  Do not drink alcohol if your health care provider tells you not to drink.  If you drink alcohol: ? Limit how much you have to  0-2 drinks a day. ? Be aware of how much alcohol is in your drink. In the [place] one drink equals one 12 oz bottle of beer (355 mL), one 5 oz glass of wine (148 mL), or one 1 oz glass of hard liquor (44 mL).   Lifestyle  Take daily care of your teeth and gums. Brush your teeth every morning and night with fluoride toothpaste. Floss one time each day.  Stay active. Exercise for at least 30 minutes 5 or more days each week.  Do not use any products that contain nicotine or tobacco, such as cigarettes, e-cigarettes, and chewing tobacco. If you need help quitting, ask your health care provider.  Do not use drugs.  If you are sexually active, practice safe sex. Use a condom or other form of protection to prevent STIs (sexually transmitted infections).  Talk with your health care provider about taking a low-dose aspirin or statin.  Find healthy ways to cope with stress, such as: ? Meditation, yoga, or listening to music. ? Journaling. ? Talking to a trusted person. ? Spending time with friends and family. Safety  Always wear your seat belt while driving or riding in a vehicle.  Do not drive: ? If you have been drinking alcohol. Do not ride with someone who has been drinking. ? When you are tired or distracted. ? While texting.  Wear a helmet and other protective equipment during sports activities.  If you have firearms in your house, make sure you follow all gun safety procedures. What's next?  Visit your health care provider once a year for an annual wellness visit.  Ask your health care provider how often you should have your eyes and teeth checked.  Stay up to date on all vaccines. This information is not intended to replace advice given to you by your health care provider. Make sure you discuss any questions you have with your health care  provider. Document Revised: 04/08/2019 Document Reviewed: 07/04/2018 Elsevier Patient Education  2021 [place], MD [facility]

## 2020-11-09 ENCOUNTER — Encounter: Payer: Medicare Other | Admitting: Internal Medicine

## 2020-11-10 ENCOUNTER — Encounter: Payer: Self-pay | Admitting: Internal Medicine

## 2020-11-10 DIAGNOSIS — N183 Chronic kidney disease, stage 3 unspecified: Secondary | ICD-10-CM | POA: Insufficient documentation

## 2021-01-31 DIAGNOSIS — Z961 Presence of intraocular lens: Secondary | ICD-10-CM | POA: Diagnosis not present

## 2021-02-02 DIAGNOSIS — R350 Frequency of micturition: Secondary | ICD-10-CM | POA: Diagnosis not present

## 2021-02-02 DIAGNOSIS — N401 Enlarged prostate with lower urinary tract symptoms: Secondary | ICD-10-CM | POA: Diagnosis not present

## 2021-02-02 DIAGNOSIS — R351 Nocturia: Secondary | ICD-10-CM | POA: Diagnosis not present

## 2021-02-04 ENCOUNTER — Other Ambulatory Visit: Payer: Self-pay | Admitting: Internal Medicine

## 2021-02-23 ENCOUNTER — Ambulatory Visit: Payer: Medicare Other | Admitting: Neurology

## 2021-03-25 ENCOUNTER — Ambulatory Visit (INDEPENDENT_AMBULATORY_CARE_PROVIDER_SITE_OTHER): Payer: Medicare Other | Admitting: Neurology

## 2021-03-25 ENCOUNTER — Encounter: Payer: Self-pay | Admitting: Neurology

## 2021-03-25 VITALS — BP 165/83 | HR 69 | Ht 69.0 in | Wt 205.5 lb

## 2021-03-25 DIAGNOSIS — G479 Sleep disorder, unspecified: Secondary | ICD-10-CM

## 2021-03-25 DIAGNOSIS — F0280 Dementia in other diseases classified elsewhere without behavioral disturbance: Secondary | ICD-10-CM | POA: Diagnosis not present

## 2021-03-25 DIAGNOSIS — G309 Alzheimer's disease, unspecified: Secondary | ICD-10-CM | POA: Diagnosis not present

## 2021-03-25 MED ORDER — DONEPEZIL HCL 5 MG PO TABS: 5.0000 mg | ORAL_TABLET | Freq: Every day | ORAL | 3 refills | Status: DC

## 2021-03-25 NOTE — Progress Notes (Signed)
Reason for visit: Memory disturbance  Joshua Lopez is an 87 y.o. male  History of present illness:  Joshua Lopez is an 87 year old right-handed white male with a history of a progressive memory disturbance.  He is on donepezil taking 5 mg at night, he will have occasional episodes of diarrhea on this dose.  At the 10 mg dose he experienced increased confusion and abdominal pain.  He is no longer operating a motor vehicle.  His wife keeps up with medications and appointments.  The patient has been noted to have significant drowsiness.  He is sleeping about 5 hours during the day and then about 10 hours at night.  He does snore at night.  He has fallen recently and has hurt both of his shoulders, he has difficulty elevating arms on either side.  He has been followed through orthopedic surgery for his shoulder issues.  Past Medical History:  Diagnosis Date   Alzheimer disease ([place]) 08/26/2020   Arthritis    diffuse   BPH (benign prostatic hypertrophy)    Dr. Risa Grill monitors  elevated PSA   Chronic lower back pain 05-10-12   States pain is related to hip   Colon polyp    2006 and 2010   Esophageal stricture    GERD (gastroesophageal reflux disease)    Hyperlipidemia    NMR  2007   Hypertension    Nasal polyps    Subclavian steal syndrome 1997    Past Surgical History:  Procedure Laterality Date   BACK SURGERY  05-10-12   lumbar fusion with hardware   CATARACT EXTRACTION W/ INTRAOCULAR LENS  IMPLANT, BILATERAL     COLONOSCOPY W/ POLYPECTOMY  2006 & 2010   ESOPHAGEAL DILATION  2003   GANGLION CYST EXCISION  1999   LUE   GANGLION CYST EXCISION  05/14/2012   Procedure: REMOVAL GANGLION OF WRIST;  Surgeon: Mcarthur Rossetti, MD;  Location: [facility];  Service: Orthopedics;  Laterality: Right;  Excision Right Wrist Ganglion Cyst   JOINT REPLACEMENT     KNEE ARTHROSCOPY  2001   left   POSTERIOR FUSION LUMBAR SPINE  02/08/12   L2-L4   PROSTATE BIOPSY  2000   Dr. Rosana Hoes    SUBCLAVIAN STENT PLACEMENT  1997   right side   TOTAL HIP ARTHROPLASTY  05/14/2012   Procedure: TOTAL HIP ARTHROPLASTY ANTERIOR APPROACH;  Surgeon: Mcarthur Rossetti, MD;  Location: [facility];  Service: Orthopedics;  Laterality: Right;  Right Total Hip Arthroplasty, Anterior Approach (C-Arm)   TOTAL SHOULDER ARTHROPLASTY  2010   Dr. Mardelle Matte; right    Family History  Problem Relation Age of Onset   Sudden death Father        murdered   Hypertension Mother    Skin cancer Mother    Skin cancer Brother    Hypertension Brother    Coronary artery disease Brother        CABG   Colon cancer Paternal Uncle    Breast cancer Paternal Grandmother    Breast cancer Paternal Aunt    Colon cancer Paternal Aunt    Heart attack Paternal Uncle    Kidney disease Neg Hx    Diabetes Neg Hx    Alzheimer's disease Neg Hx    Dementia Neg Hx     Social history:  reports that he quit smoking about 47 years ago. His smoking use included cigarettes. He has a 16.50 pack-year smoking history. He quit smokeless tobacco use about 46 years  ago.  His smokeless tobacco use included chew. He reports that he does not drink alcohol and does not use drugs.    Allergies  Allergen Reactions   Aspirin Rash and Other (See Comments)    Dyspnea, eyelid edema, truncal urticaria--Patient tolerates other NSAIDS at home Because of a history of documented adverse serious drug reaction;Medi Alert bracelet  is recommended Other reaction(s): Other (See Comments) Dyspnea, eyelid edema, truncal urticaria--Patient tolerates other NSAIDS at home Because of a history of documented adverse serious drug reaction;Medi Alert bracelet  is recommended  Wife states pt is highly allergic     Medications:  Prior to Admission medications   Medication Sig Start Date End Date Taking? Authorizing Provider  acetaminophen (TYLENOL) 500 MG tablet Take 500 mg by mouth every 6 (six) hours as needed. For pain   Yes [provider]   albuterol (PROVENTIL HFA;VENTOLIN HFA) 108 (90 Base) MCG/ACT inhaler Inhale 2 puffs into the lungs every 6 (six) hours as needed for wheezing or shortness of breath. 10/01/18  Yes [place], Betty G, MD  Calcium Carbonate-Vitamin D (CALTRATE 600+D PO) Take 1 tablet by mouth 2 (two) times daily.    Yes [provider]  donepezil (ARICEPT) 5 MG tablet Take 1 tablet (5 mg total) by mouth at bedtime. 11/04/20  Yes Isaac Bliss, Rayford Halsted, MD  escitalopram (LEXAPRO) 5 MG tablet TAKE 1 TABLET EVERY DAY 10/14/20  Yes Isaac Bliss, Rayford Halsted, MD  LORazepam (ATIVAN) 1 MG tablet Take 1 tablet (1 mg total) by mouth at bedtime as needed for anxiety. 06/06/20  Yes Isla Pence, MD  montelukast (SINGULAIR) 10 MG tablet TAKE 1 TABLET AT BEDTIME 02/04/21  Yes Isaac Bliss, Rayford Halsted, MD  pantoprazole (PROTONIX) 40 MG tablet TAKE 1 TABLET EVERY DAY 02/04/21  Yes Isaac Bliss, Rayford Halsted, MD  polyethylene glycol [facility] / GLYCOLAX) packet Take 17 g by mouth 2 (two) times daily.    Yes [provider]  pravastatin (PRAVACHOL) 20 MG tablet TAKE 1 TABLET AT BEDTIME 02/04/21  Yes Isaac Bliss, Rayford Halsted, MD  tamsulosin (FLOMAX) 0.4 MG CAPS capsule Take 1 capsule (0.4 mg total) by mouth at bedtime. 12/09/18  Yes Isaac Bliss, Rayford Halsted, MD    ROS:  Out of a complete 14 system review of symptoms, the patient complains only of the following symptoms, and all other reviewed systems are negative.  Memory problems Shoulder pain Drowsiness  Blood pressure (!) 165/83, pulse 69, height '5\' 9"'$  (1.753 m), weight 205 lb 8 oz (93.2 kg).  Physical Exam  General: The patient is alert and cooperative at the time of the examination.  Skin: No significant peripheral edema is noted.   Neurologic Exam  Mental status: The patient is alert and oriented x 3 at the time of the examination. The Mini-Mental status examination done today shows a total score of 18/30.   Cranial nerves: Facial symmetry  is present. Speech is normal, no aphasia or dysarthria is noted. Extraocular movements are full. Visual fields are full.  Motor: The patient has good strength in all 4 extremities.  Sensory examination: Soft touch sensation is symmetric on the face, arms, and legs.  Coordination: The patient has good finger-nose-finger and heel-to-shin bilaterally.  The patient does have some apraxia with use of the extremities.  Gait and station: The patient has a limping type gait on the right leg.  Tandem gait is unsteady.  Romberg is negative.  Reflexes: Deep tendon reflexes are symmetric.   CT head  09/10/20:   IMPRESSION: 1. No acute intracranial findings. 2. Mild chronic microangiopathic changes of the cerebral white matter. 3. Chronic sinusitis.  * CT scan images were reviewed online. I agree with the written report.    Assessment/Plan:  1.  Memory disturbance, dementia  2.  Excessive daytime drowsiness  The patient will remain on Aricept taking 5 mg at night.  A prescription was sent in.  Given the fact that he is sleeping 15 or more hours a day, we will send him for a sleep evaluation to exclude sleep apnea.  He will follow-up here in 6 months, in the future he can be followed through Dr. Leta Baptist.  Jill Alexanders MD 03/25/2021 10:48 AM  [facility] [address] [place], [place] [address]  Phone [phone] Fax [phone]

## 2021-04-22 ENCOUNTER — Other Ambulatory Visit: Payer: Self-pay | Admitting: Internal Medicine

## 2021-04-27 DIAGNOSIS — L821 Other seborrheic keratosis: Secondary | ICD-10-CM | POA: Diagnosis not present

## 2021-04-27 DIAGNOSIS — L814 Other melanin hyperpigmentation: Secondary | ICD-10-CM | POA: Diagnosis not present

## 2021-04-27 DIAGNOSIS — D485 Neoplasm of uncertain behavior of skin: Secondary | ICD-10-CM | POA: Diagnosis not present

## 2021-04-27 DIAGNOSIS — D1801 Hemangioma of skin and subcutaneous tissue: Secondary | ICD-10-CM | POA: Diagnosis not present

## 2021-04-27 DIAGNOSIS — Z85828 Personal history of other malignant neoplasm of skin: Secondary | ICD-10-CM | POA: Diagnosis not present

## 2021-04-27 DIAGNOSIS — L812 Freckles: Secondary | ICD-10-CM | POA: Diagnosis not present

## 2021-04-27 DIAGNOSIS — L570 Actinic keratosis: Secondary | ICD-10-CM | POA: Diagnosis not present

## 2021-06-08 ENCOUNTER — Encounter: Payer: Self-pay | Admitting: Neurology

## 2021-06-08 ENCOUNTER — Ambulatory Visit (INDEPENDENT_AMBULATORY_CARE_PROVIDER_SITE_OTHER): Payer: Medicare Other | Admitting: Neurology

## 2021-06-08 VITALS — BP 143/77 | HR 66 | Ht 70.0 in | Wt 204.2 lb

## 2021-06-08 DIAGNOSIS — R0683 Snoring: Secondary | ICD-10-CM | POA: Diagnosis not present

## 2021-06-08 DIAGNOSIS — R413 Other amnesia: Secondary | ICD-10-CM

## 2021-06-08 DIAGNOSIS — R0681 Apnea, not elsewhere classified: Secondary | ICD-10-CM

## 2021-06-08 DIAGNOSIS — R351 Nocturia: Secondary | ICD-10-CM

## 2021-06-08 DIAGNOSIS — G4719 Other hypersomnia: Secondary | ICD-10-CM | POA: Diagnosis not present

## 2021-06-08 DIAGNOSIS — E663 Overweight: Secondary | ICD-10-CM | POA: Diagnosis not present

## 2021-06-08 NOTE — Progress Notes (Signed)
Subjective:    Patient ID: Joshua Lopez is a 87 y.o. male.  HPI    Joshua Age, MD, PhD [facility] [address], [address] [place], [place] [address]  Joshua Lopez is an 87 year old gentleman with an underlying medical history of memory loss, hypertension, hyperlipidemia, reflux disease, BPH, arthritis, subclavian steal syndrome and overweight state, who presents for evaluation of his sleepiness.  He was referred by Dr. Jannifer Lopez.  The patient is accompanied by his wife today.  He last saw Dr. Jannifer Lopez in September 2022.  He has been on Aricept.  He sleeps extended hours and is sleepy during the day.  His wife provides most of his history.  He snores mildly, he has sleep disruption due to nocturia, no trouble falling asleep or maintaining sleep otherwise, he goes to the bathroom about 3 times on an average night.  He is to bed around 830 but has already dozed off in the chair for then.  His rise time is around 7:30 AM.  He drinks decaf coffee, typically 1 serving of regular soda with caffeine during the day.  He does not drink any alcohol and quit smoking in 1975.  He lives with his wife, they have 1 daughter who is 1, another daughter passed away at Lopez 47.  They have 5 grandchildren and 7 great-grandchildren.  He had a fall about 2 to 3 months ago.  He has significant daytime somnolence and often dozes off when sedentary.  He does not drive.  He denies recurrent morning headaches.  His Epworth sleepiness score is 22/24, fatigue severity score is 40 out of 63.  His Past Medical History Is Significant For: Past Medical History:  Diagnosis Date   Alzheimer disease ([place]) 08/26/2020   Arthritis    diffuse   BPH (benign prostatic hypertrophy)    Dr. Risa Lopez monitors  elevated PSA   Chronic lower back pain 05-10-12   States pain is related to hip   Colon polyp    2006 and 2010   Esophageal stricture    GERD (gastroesophageal reflux disease)    Hyperlipidemia    NMR   2007   Hypertension    Nasal polyps    Subclavian steal syndrome 1997    His Past Surgical History Is Significant For: Past Surgical History:  Procedure Laterality Date   BACK SURGERY  05-10-12   lumbar fusion with hardware   CATARACT EXTRACTION W/ INTRAOCULAR LENS  IMPLANT, BILATERAL     COLONOSCOPY W/ POLYPECTOMY  2006 & 2010   ESOPHAGEAL DILATION  2003   GANGLION CYST EXCISION  1999   LUE   GANGLION CYST EXCISION  05/14/2012   Procedure: REMOVAL GANGLION OF WRIST;  Surgeon: Joshua Rossetti, MD;  Location: [facility];  Service: Orthopedics;  Laterality: Right;  Excision Right Wrist Ganglion Cyst   JOINT REPLACEMENT     KNEE ARTHROSCOPY  2001   left   POSTERIOR FUSION LUMBAR SPINE  02/08/12   L2-L4   PROSTATE BIOPSY  2000   Dr. Rosana Lopez   SUBCLAVIAN STENT PLACEMENT  1997   right side   TOTAL HIP ARTHROPLASTY  05/14/2012   Procedure: TOTAL HIP ARTHROPLASTY ANTERIOR APPROACH;  Surgeon: Joshua Rossetti, MD;  Location: [facility];  Service: Orthopedics;  Laterality: Right;  Right Total Hip Arthroplasty, Anterior Approach (C-Arm)   TOTAL SHOULDER ARTHROPLASTY  2010   Dr. Mardelle Lopez; right    His Family History Is Significant For: Family History  Problem Relation  Lopez of Onset   Hypertension Mother    Skin cancer Mother    Sudden death Father        murdered   Skin cancer Brother    Hypertension Brother    Coronary artery disease Brother        CABG   Breast cancer Paternal Aunt    Colon cancer Paternal Aunt    Colon cancer Paternal Uncle    Heart attack Paternal Uncle    Breast cancer Paternal Grandmother    Kidney disease Neg Hx    Diabetes Neg Hx    Alzheimer's disease Neg Hx    Dementia Neg Hx    Sleep apnea Neg Hx     His Social History Is Significant For: Social History   Socioeconomic History   Marital status: Married    Spouse name: Not on file   Number of children: 2   Years of education: Not on file   Highest education level: 7th grade  Occupational  History   Not on file  Tobacco Use   Smoking status: Former    Packs/day: 0.50    Years: 33.00    Pack years: 16.50    Types: Cigarettes    Quit date: 07/24/1973    Years since quitting: 47.9   Smokeless tobacco: Former    Types: Chew    Quit date: 07/24/1974   Tobacco comments:    smoked [place] , up to 1/3 ppd  Substance and Sexual Activity   Alcohol use: No   Drug use: No   Sexual activity: Yes  Other Topics Concern   Not on file  Social History Narrative   Lives at home with wife   Right handed   Caffeine: 1 cup/day of decaf coffee   Social Determinants of Health   Financial Resource Strain: Not on file  Food Insecurity: Not on file  Transportation Needs: Not on file  Physical Activity: Not on file  Stress: Not on file  Social Connections: Not on file    His Allergies Are:  Allergies  Allergen Reactions   Aspirin Rash and Other (See Comments)    Dyspnea, eyelid edema, truncal urticaria--Patient tolerates other NSAIDS at home Because of a history of documented adverse serious drug reaction;Medi Alert bracelet  is recommended Other reaction(s): Other (See Comments) Dyspnea, eyelid edema, truncal urticaria--Patient tolerates other NSAIDS at home Because of a history of documented adverse serious drug reaction;Medi Alert bracelet  is recommended  Wife states pt is highly allergic   :   His Current Medications Are:  Outpatient Encounter Medications as of 06/08/2021  Medication Sig   acetaminophen (TYLENOL) 500 MG tablet Take 500 mg by mouth every 6 (six) hours as needed. For pain   Calcium Carbonate-Vitamin D (CALTRATE 600+D PO) Take 1 tablet by mouth 2 (two) times daily.    donepezil (ARICEPT) 5 MG tablet Take 1 tablet (5 mg total) by mouth at bedtime.   escitalopram (LEXAPRO) 5 MG tablet TAKE 1 TABLET EVERY DAY   LORazepam (ATIVAN) 1 MG tablet Take 1 tablet (1 mg total) by mouth at bedtime as needed for anxiety.   montelukast (SINGULAIR) 10 MG tablet TAKE 1  TABLET AT BEDTIME   pantoprazole (PROTONIX) 40 MG tablet TAKE 1 TABLET EVERY DAY   polyethylene glycol (MIRALAX / GLYCOLAX) packet Take 17 g by mouth 2 (two) times daily.    pravastatin (PRAVACHOL) 20 MG tablet TAKE 1 TABLET AT BEDTIME   tamsulosin (FLOMAX) 0.4 MG CAPS capsule  Take 1 capsule (0.4 mg total) by mouth at bedtime.   albuterol (PROVENTIL HFA;VENTOLIN HFA) 108 (90 Base) MCG/ACT inhaler Inhale 2 puffs into the lungs every 6 (six) hours as needed for wheezing or shortness of breath.   No facility-administered encounter medications on file as of 06/08/2021.  :   Review of Systems:  Out of a complete 14 point review of systems, all are reviewed and negative with the exception of these symptoms as listed below:   Review of Systems  Neurological:        Pt is here for sleep consult. Pt wife states that patient has Hypertension, snores,and fatigue throughout the day. Pt wife states she thinks pt has sleep study 30 years ago. Pt  doesn't have CPAP home .   FSS:22  ESS:40   Objective:  Neurological Exam  Physical Exam Physical Examination:   Vitals:   06/08/21 1131  BP: (!) 143/77  Pulse: 66    General Examination: The patient is a very pleasant 87 y.o. male in no acute distress. He appears well-developed and well-nourished and well groomed.   HEENT: Normocephalic, atraumatic, pupils are equal, round and reactive to light, extraocular tracking is mildly impaired.  Face is symmetric with normal facial animation, speech is clear without dysarthria.  Mildly hard of hearing.  Airway examination reveals mild mouth dryness, Mallampati class II.  Smaller tonsils, small airway entry, longer uvula noted.  Neck circumference of 17 inches.  Tongue protrudes centrally and palate elevates symmetrically.  Neck is supple, no carotid bruits.  Has some implants, partial dentures are full dentures.  Chest: Clear to auscultation without wheezing, rhonchi or crackles noted.  Heart: S1+S2+0,  regular and normal without murmurs, rubs or gallops noted.   Abdomen: Soft, non-tender and non-distended with normal bowel sounds appreciated on auscultation.  Extremities: There is trace pitting edema in the distal lower extremities bilaterally.   Skin: Warm and dry without trophic changes noted.   Musculoskeletal: exam reveals no obvious joint deformities, but does have some discomfort in both shoulders.    Neurologically:  Mental status: The patient is awake, appears to be sleepy and is unable to provide details of his history.  His history is primarily provided by his wife.  His memory is impaired.  Mood is congruent, affect normal. Cranial nerves II - XII are as described above under HEENT exam.  Motor exam: Normal bulk, strength and tone is noted. There is no tremor, fine motor skills and coordination: Globally mildly impaired.   Cerebellar testing: No dysmetria or intention tremor. There is no truncal or gait ataxia.  Sensory exam: intact to light touch in the upper and lower extremities.  Gait, station and balance: He stands with mild difficulty, no walking aid.   Assessment and Plan:  In summary, Joshua Lopez is a very pleasant 87 y.o.-year old male with an underlying medical history of memory loss, hypertension, hyperlipidemia, reflux disease, BPH, arthritis, subclavian steal syndrome and overweight state, whose history and physical exam are concerning for obstructive sleep apnea (OSA). I had a long chat with the patient and his wife about the diagnosis of sleep apnea.  We talked primarily about the prognosis and treatment option with a positive airway pressure device such as CPAP or AutoPap.  His wife is somewhat familiar with the CPAP treatment option and he would be willing to come in for a sleep study and try therapy if the need arises.  He would be willing to proceed with a laboratory  attended sleep study.  I went over the procedure with the patient and his wife in detail and  answered all their questions, we will await test results and keep him posted as to the results by phone call and pick up our discussion about treatment options afterwards.  We will plan to follow-up in sleep clinic accordingly.  I answered all the questions today and the patient and his wife were in agreement.

## 2021-06-08 NOTE — Patient Instructions (Addendum)
It was nice to meet you today.  Based on your symptoms and your exam I believe you are at risk for obstructive sleep apnea (aka OSA), and I think we should proceed with a sleep study to determine whether you do or do not have OSA and how severe it is. Even, if you have mild OSA, I may want you to consider treatment with CPAP, as treatment of even borderline or mild sleep apnea can result and improvement of symptoms such as sleep disruption, daytime sleepiness, nighttime bathroom breaks, restless leg symptoms, improvement of headache syndromes, even improved mood disorder.   Please remember, the long-term risks and ramifications of untreated moderate to severe obstructive sleep apnea are: increased Cardiovascular disease, including congestive heart failure, stroke, difficult to control hypertension, treatment resistant obesity, arrhythmias, especially irregular heartbeat commonly known as A. Fib. (atrial fibrillation); even type 2 diabetes has been linked to untreated OSA.   Sleep apnea can cause disruption of sleep and sleep deprivation in most cases, which, in turn, can cause recurrent headaches, problems with memory, mood, concentration, focus, and vigilance. Most people with untreated sleep apnea report excessive daytime sleepiness, which can affect their ability to drive. Please do not drive if you feel sleepy. Patients with sleep apnea can also develop difficulty initiating and maintaining sleep (aka insomnia).   Having sleep apnea may increase your risk for other sleep disorders, including involuntary behaviors sleep such as sleep terrors, sleep talking, sleepwalking.    Having sleep apnea can also increase your risk for restless leg syndrome and leg movements at night.   Please note that untreated obstructive sleep apnea may carry additional perioperative morbidity. Patients with significant obstructive sleep apnea (typically, in the moderate to severe degree) should receive, if possible,  perioperative PAP (positive airway pressure) therapy and the surgeons and particularly the anesthesiologists should be informed of the diagnosis and the severity of the sleep disordered breathing.   I will likely see you back after your sleep study to go over the test results and where to go from there. We will call you after your sleep study to advise about the results (most likely, you will hear from [facility], my nurse) and to set up an appointment at the time, as necessary.    Our sleep lab administrative assistant will call you to schedule your sleep study and give you further instructions, regarding the check in process for the sleep study, arrival time, what to bring, when you can expect to leave after the study, etc., and to answer any other logistical questions you may have. If you don't hear back from her by about 2 weeks from now, please feel free to call her direct line at [phone] or you can call our general clinic number, or email [place] through My Chart.

## 2021-06-13 ENCOUNTER — Telehealth: Payer: Self-pay | Admitting: Neurology

## 2021-06-13 NOTE — Telephone Encounter (Signed)
Tried to call pt to schedule his sleep study but pt does not have a VM

## 2021-06-24 ENCOUNTER — Ambulatory Visit (INDEPENDENT_AMBULATORY_CARE_PROVIDER_SITE_OTHER): Payer: Medicare Other | Admitting: Neurology

## 2021-06-24 ENCOUNTER — Other Ambulatory Visit: Payer: Self-pay

## 2021-06-24 DIAGNOSIS — R0683 Snoring: Secondary | ICD-10-CM

## 2021-06-24 DIAGNOSIS — R0681 Apnea, not elsewhere classified: Secondary | ICD-10-CM

## 2021-06-24 DIAGNOSIS — G4720 Circadian rhythm sleep disorder, unspecified type: Secondary | ICD-10-CM

## 2021-06-24 DIAGNOSIS — G4719 Other hypersomnia: Secondary | ICD-10-CM

## 2021-06-24 DIAGNOSIS — R9431 Abnormal electrocardiogram [ECG] [EKG]: Secondary | ICD-10-CM

## 2021-06-24 DIAGNOSIS — G4733 Obstructive sleep apnea (adult) (pediatric): Secondary | ICD-10-CM

## 2021-06-24 DIAGNOSIS — R351 Nocturia: Secondary | ICD-10-CM

## 2021-06-24 DIAGNOSIS — E663 Overweight: Secondary | ICD-10-CM

## 2021-06-24 DIAGNOSIS — R413 Other amnesia: Secondary | ICD-10-CM

## 2021-07-07 NOTE — Procedures (Signed)
PATIENT'S NAME:  Joshua Lopez, Joshua Lopez DOB:      01-05-1930      MR#:    [id number]     DATE OF RECORDING: 06/24/2021 REFERRING M.D.:  Stephanie Acre, MD Study Performed:  Split-Night Titration Study HISTORY: 87 year old gentleman with an underlying medical history of memory loss, hypertension, hyperlipidemia, reflux disease, BPH, arthritis, subclavian steal syndrome and overweight state, who presents for evaluation of his sleepiness. The patient endorsed the Epworth Sleepiness Scale at 22 points. The patient's weight 204 pounds with a height of 70 (inches), resulting in a BMI of 29.4 kg/m2. The patient's neck circumference measured 17 inches.  CURRENT MEDICATIONS: Tylenol, Caltrate+D, Aricept, Lexapro, Ativan, Singulair, Protonix, Miralax, Pravachol, Flomax, Proventil   PROCEDURE:  This is a multichannel digital polysomnogram utilizing the Somnostar 11.2 system.  Electrodes and sensors were applied and monitored per AASM Specifications.   EEG, EOG, Chin and Limb EMG, were sampled at 200 Hz.  ECG, Snore and Nasal Pressure, Thermal Airflow, Respiratory Effort, CPAP Flow and Pressure, Oximetry was sampled at 50 Hz. Digital video and audio were recorded.      BASELINE STUDY WITHOUT CPAP RESULTS:  Lights Out was at 21:48 and Lights On at 04:10 for the night, split study was started at 00:26, epoch 323, as the patient fulfilled emergency split criteria.  Total recording time (TRT) was 157.5, with a total sleep time (TST) of 128 minutes.   The patient's sleep latency was 13 minutes. REM latency was 101 minutes. The sleep efficiency was 81.3 %.    SLEEP ARCHITECTURE: WASO (Wake after sleep onset) was 25 minutes, Stage N1 was 8.5 minutes, Stage N2 was 111 minutes, Stage N3 was 0 minutes and Stage R (REM sleep) was 8.5 minutes.  The percentages were Stage N1 6.6%, Stage N2 86.7%, which is markedly increased, stage N3 was absent, and Stage R (REM sleep) 6.6%. The arousals were noted as: 31 were spontaneous, 0 were  associated with PLMs, 33 were associated with respiratory events.  RESPIRATORY ANALYSIS:  There were a total of 99 respiratory events:  69 obstructive apneas, 11 central apneas and 6 mixed apneas with a total of 86 apneas and an apnea index (AI) of 40.3. There were 13 hypopneas with a hypopnea index of 6.1. The patient also had 0 respiratory event related arousals (RERAs).  Snoring was noted.     The total APNEA/HYPOPNEA INDEX (AHI) was 46.4 /hour and the total RESPIRATORY DISTURBANCE INDEX was 46.4 /hour.  0 events occurred in REM sleep and 43 events in NREM. The REM AHI was 0, /hour versus a non-REM AHI of 49.7 /hour. The patient spent 71 minutes sleep time in the supine position 224 minutes in non-supine. The supine AHI was 63.0 /hour versus a non-supine AHI of 38.9 /hour.  OXYGEN SATURATION & C02:  The wake baseline 02 saturation was 96%, with the lowest being 82%. Time spent below 89% saturation equaled 12 minutes.  PERIODIC LIMB MOVEMENTS: The patient had a total of 0 Periodic Limb Movements.  The Periodic Limb Movement (PLM) index was 0 /hour and the PLM Arousal index was 0 /hour.  Audio and video analysis did not show any abnormal or unusual movements, behaviors, phonations or vocalizations, some sleep talking was noted. The patient took 1 bathroom break. Mild snoring was noted The EKG showed occasional PVCs.    TITRATION STUDY WITH CPAP RESULTS:   The patient was fitted with a large Vitera fullface mask from [employer].  CPAP was initiated at 5 cmH20 with  heated humidity per AASM split night standards and pressure was advanced to 9 cmH20 because of hypopneas, apneas and desaturations.  At a PAP pressure of 9 cmH20, there was a reduction of the AHI to 3.7/h with nonsupine REM sleep achieved and O2 nadir of 86% briefly.    Total recording time (TRT) was 225 minutes, with a total sleep time (TST) of 167 minutes. The patient's sleep latency was 0.5 minutes. REM latency was 102 minutes.  The  sleep efficiency was 74.2 %.    SLEEP ARCHITECTURE: Wake after sleep was 13.5 minutes, Stage N1 3.5 minutes, Stage N2 152 minutes, Stage N3 0 minutes and Stage R (REM sleep) 11.5 minutes. The percentages were: Stage N1 2.1%, Stage N2 91.%, Stage N3 was absent, and Stage R (REM sleep) 6.9%. The arousals were noted as: 37 were spontaneous, 2 were associated with PLMs, 2 were associated with respiratory events.  RESPIRATORY ANALYSIS:  There were a total of 17 respiratory events: 2 obstructive apneas, 7 central apneas and 2 mixed apneas with a total of 11 apneas and an apnea index (AI) of 4.. There were 6 hypopneas with a hypopnea index of 2.2 /hour. The patient also had 0 respiratory event related arousals (RERAs).      The total APNEA/HYPOPNEA INDEX  (AHI) was 6.1 /hour and the total RESPIRATORY DISTURBANCE INDEX was 6.1 /hour.  0 events occurred in REM sleep and 17 events in NREM. The REM AHI was 0 /hour versus a non-REM AHI of 6.6 /hour. REM sleep was achieved on a pressure of  cm/h2o (AHI was  .) The patient spent 19% of total sleep time in the supine position. The supine AHI was 15.4 /hour, versus a non-supine AHI of 4.0/hour.  OXYGEN SATURATION & C02:  The wake baseline 02 saturation was 93%, with the lowest being 86%. Time spent below 89% saturation equaled 1 minutes.  PERIODIC LIMB MOVEMENTS:    The patient had a total of 5 Periodic Limb Movements. The Periodic Limb Movement (PLM) index was 1.8 /hour and the PLM Arousal index was .7 /hour.  Post-study, the patient indicated that sleep worse than usual.  POLYSOMNOGRAPHY IMPRESSION :   Severe Obstructive Sleep Apnea (OSA)  Dysfunctions associated with sleep stages or arousals from sleep Non-specific abnormal EKG  RECOMMENDATIONS: This patient has severe obstructive sleep apnea and responded well on CPAP therapy.  Due to the oxygen nadir of 86% with a treatment pressure of 9 cm, I will recommend a home CPAP of 10 cm via full facemask with  heated humidity. The patient will be advised to be fully compliant with PAP therapy to improve sleep related symptoms and decrease long term cardiovascular risks. Please note that untreated obstructive sleep apnea may carry additional perioperative morbidity. Patients with significant obstructive sleep apnea should receive perioperative PAP therapy and the surgeons and particularly the anesthesiologist should be informed of the diagnosis and the severity of the sleep disordered breathing. This study shows sleep fragmentation and abnormal sleep stage percentages; these are nonspecific findings and per se do not signify an intrinsic sleep disorder or a cause for the patient's sleep-related symptoms. Causes include (but are not limited to) the first night effect of the sleep study, circadian rhythm disturbances, medication effect or an underlying mood disorder or medical problem.  The study showed occasional PVCs on single lead EKG; clinical correlation is recommended and consultation with cardiology may be feasible.  The patient will be seen in follow-up in the sleep clinic at [facility] for  discussion of the test results, symptom and treatment compliance review, further management strategies, etc. The referring provider will be notified of the test results.  I certify that I have reviewed the entire raw data recording prior to the issuance of this report in accordance with the Standards of Accreditation of the American Academy of Sleep Medicine (AASM)   Dayan Desa,MD,PhD Diplomat, American Board of Neurology and Sleep Medicine( Neurology and Sleep Medicine)

## 2021-07-07 NOTE — Addendum Note (Signed)
Addended by: [name] on: 07/07/2021 05:04 PM   Modules accepted: Orders

## 2021-07-11 ENCOUNTER — Telehealth: Payer: Self-pay | Admitting: *Deleted

## 2021-07-11 ENCOUNTER — Encounter: Payer: Self-pay | Admitting: *Deleted

## 2021-07-11 NOTE — Telephone Encounter (Signed)
-----   Message from [name], MD sent at 07/07/2021  5:04 PM EST ----- Patient referred by Dr. Jannifer Franklin, seen by me on 06/08/2021, split night sleep study on 06/24/2021. Please call and notify patient that the recent sleep study confirmed the diagnosis of severe OSA. He did well with CPAP during the study with significant improvement of the respiratory events. Therefore, I would like start the patient on CPAP therapy at home by prescribing a machine for home use. I placed the order in the chart.  His EKG showed occasional extra beats which we call PVCs.  It may help to get a full EKG by his primary care physician.  She may decide to send him to a cardiologist based on his history.  I recommend follow-up with his PCP for this.  Please advise patient that we need a follow up appointment with either myself or one of our nurse practitioners in about 10 weeks post set-up to check for how the patient is feeling and how well the patient is using the machine, etc. Please go ahead and schedule the appointment, while you have the patient on the phone and make sure patient understands the importance of keeping this window for the FU appointment, as it is often an insurance requirement. Failing to adhere to this may result in losing coverage for sleep apnea treatment, at which point most patients are left with a choice of returning the machine or paying out of pocket (and we want neither of this to happen!).  Please re-enforce the importance of compliance with treatment and the need for [place] to monitor compliance data - again an insurance requirement and usually a good feedback for the patient as far as how they are doing.  Also remind patient, that any PAP machine or mask issues should be first addressed with the DME company, who provided the machine/mask.  Please ask if patient has a preference regarding DME company, may depend on the insurance too.  Please arrange for CPAP set up at home through a DME company of  patient's choice.  Once you have spoken to the patient you can close the phone encounter. Please fax/route report to referring provider, thanks,   [name], MD, PhD [facility])

## 2021-07-11 NOTE — Telephone Encounter (Signed)
I spoke with the patient's wife, Massie Maroon (on [place]), and discussed the results of pt's sleep study as noted below in detail.  Patient's wife understands that his sleep study showed that he did well on CPAP with significant improvement of the respiratory events.  He does have obstructive sleep apnea.  She is amenable to starting the patient on CPAP therapy at home.  She understands his EKG also showed occasional extra beats called PVCs.  She will call patient's primary care to get an appointment scheduled for further evaluation.  She understands the results will also be sent over to Dr. Isaac Bliss.  We also discussed insurance compliance requirements as noted below which includes using the machine at least 4 hours every night and following up in the sleep clinic between 30 and 90 days after the patient starts using his machine.  Follow-up appointment had already been scheduled with Judson Roch NP for early March so we kept that same appointment in hopes that this would fall in the compliance window for CPAP.  Patient's wife does not have a preference for DME company.  I let her know we would send the orders over to [facility].  I also gave her the phone number.  She understands she will receive a welcome call within the next few days.  She states the patient's insurance will likely be switching to [facility].  In January.    I faxed all of the information including the office note, sleep study result, insurance information, to [facility]. Received a receipt of confirmation. Pt's appt is on 09/27/21 at 9:45 AM. Result routed to Dr Isaac Bliss. Mailed letter to pt that summarizes call with wife.

## 2021-07-14 ENCOUNTER — Ambulatory Visit (INDEPENDENT_AMBULATORY_CARE_PROVIDER_SITE_OTHER): Payer: Medicare Other | Admitting: Internal Medicine

## 2021-07-14 ENCOUNTER — Encounter: Payer: Self-pay | Admitting: Internal Medicine

## 2021-07-14 VITALS — BP 150/90 | HR 64 | Temp 97.6°F | Wt 204.1 lb

## 2021-07-14 DIAGNOSIS — I499 Cardiac arrhythmia, unspecified: Secondary | ICD-10-CM

## 2021-07-14 NOTE — Progress Notes (Signed)
Established Patient Office Visit     This visit occurred during the SARS-CoV-2 public health emergency.  Safety protocols were in place, including screening questions prior to the visit, additional usage of staff PPE, and extensive cleaning of exam room while observing appropriate contact time as indicated for disinfecting solutions.    CC/Reason for Visit: Abnormal EKG  HPI: Joshua Lopez is a 87 y.o. male who is coming in today for the above mentioned reasons.  He recently had a sleep study.  During that study a 1-lead EKG showed what was thought to be PVCs and he was referred back to me for evaluation.  He is here today with his wife as he has pretty advanced dementia and hearing loss.  Per wife he has not been complaining of shortness of breath, chest pain or palpitations.  Past Medical/Surgical History: Past Medical History:  Diagnosis Date   Alzheimer disease ([place]) 08/26/2020   Arthritis    diffuse   BPH (benign prostatic hypertrophy)    Dr. Risa Grill monitors  elevated PSA   Chronic lower back pain 05-10-12   States pain is related to hip   Colon polyp    2006 and 2010   Esophageal stricture    GERD (gastroesophageal reflux disease)    Hyperlipidemia    NMR  2007   Hypertension    Nasal polyps    Subclavian steal syndrome 1997    Past Surgical History:  Procedure Laterality Date   BACK SURGERY  05-10-12   lumbar fusion with hardware   CATARACT EXTRACTION W/ INTRAOCULAR LENS  IMPLANT, BILATERAL     COLONOSCOPY W/ POLYPECTOMY  2006 & 2010   ESOPHAGEAL DILATION  2003   GANGLION CYST EXCISION  1999   LUE   GANGLION CYST EXCISION  05/14/2012   Procedure: REMOVAL GANGLION OF WRIST;  Surgeon: Mcarthur Rossetti, MD;  Location: [facility];  Service: Orthopedics;  Laterality: Right;  Excision Right Wrist Ganglion Cyst   JOINT REPLACEMENT     KNEE ARTHROSCOPY  2001   left   POSTERIOR FUSION LUMBAR SPINE  02/08/12   L2-L4   PROSTATE BIOPSY  2000   Dr. Rosana Hoes    SUBCLAVIAN STENT PLACEMENT  1997   right side   TOTAL HIP ARTHROPLASTY  05/14/2012   Procedure: TOTAL HIP ARTHROPLASTY ANTERIOR APPROACH;  Surgeon: Mcarthur Rossetti, MD;  Location: [facility];  Service: Orthopedics;  Laterality: Right;  Right Total Hip Arthroplasty, Anterior Approach (C-Arm)   TOTAL SHOULDER ARTHROPLASTY  2010   Dr. Mardelle Matte; right    Social History:  reports that he quit smoking about 48 years ago. His smoking use included cigarettes. He has a 16.50 pack-year smoking history. He quit smokeless tobacco use about 47 years ago.  His smokeless tobacco use included chew. He reports that he does not drink alcohol and does not use drugs.  Allergies: Allergies  Allergen Reactions   Aspirin Rash and Other (See Comments)    Dyspnea, eyelid edema, truncal urticaria--Patient tolerates other NSAIDS at home Because of a history of documented adverse serious drug reaction;Medi Alert bracelet  is recommended Other reaction(s): Other (See Comments) Dyspnea, eyelid edema, truncal urticaria--Patient tolerates other NSAIDS at home Because of a history of documented adverse serious drug reaction;Medi Alert bracelet  is recommended  Wife states pt is highly allergic     Family History:  Family History  Problem Relation Age of Onset   Hypertension Mother    Skin cancer Mother  Sudden death Father        murdered   Skin cancer Brother    Hypertension Brother    Coronary artery disease Brother        CABG   Breast cancer Paternal Aunt    Colon cancer Paternal Aunt    Colon cancer Paternal Uncle    Heart attack Paternal Uncle    Breast cancer Paternal Grandmother    Kidney disease Neg Hx    Diabetes Neg Hx    Alzheimer's disease Neg Hx    Dementia Neg Hx    Sleep apnea Neg Hx      Current Outpatient Medications:    acetaminophen (TYLENOL) 500 MG tablet, Take 500 mg by mouth every 6 (six) hours as needed. For pain, Disp: , Rfl:    albuterol (PROVENTIL HFA;VENTOLIN HFA) 108  (90 Base) MCG/ACT inhaler, Inhale 2 puffs into the lungs every 6 (six) hours as needed for wheezing or shortness of breath., Disp: 1 Inhaler, Rfl: 0   Calcium Carbonate-Vitamin D (CALTRATE 600+D PO), Take 1 tablet by mouth 2 (two) times daily. , Disp: , Rfl:    donepezil (ARICEPT) 5 MG tablet, Take 1 tablet (5 mg total) by mouth at bedtime., Disp: 90 tablet, Rfl: 3   escitalopram (LEXAPRO) 5 MG tablet, TAKE 1 TABLET EVERY DAY, Disp: 90 tablet, Rfl: 1   LORazepam (ATIVAN) 1 MG tablet, Take 1 tablet (1 mg total) by mouth at bedtime as needed for anxiety., Disp: 15 tablet, Rfl: 0   montelukast (SINGULAIR) 10 MG tablet, TAKE 1 TABLET AT BEDTIME, Disp: 90 tablet, Rfl: 1   pantoprazole (PROTONIX) 40 MG tablet, TAKE 1 TABLET EVERY DAY, Disp: 90 tablet, Rfl: 1   polyethylene glycol (MIRALAX / GLYCOLAX) packet, Take 17 g by mouth 2 (two) times daily. , Disp: , Rfl:    pravastatin (PRAVACHOL) 20 MG tablet, TAKE 1 TABLET AT BEDTIME, Disp: 90 tablet, Rfl: 1   tamsulosin (FLOMAX) 0.4 MG CAPS capsule, Take 1 capsule (0.4 mg total) by mouth at bedtime., Disp: 90 capsule, Rfl: 1  Review of Systems:  Constitutional: Denies fever, chills, diaphoresis, appetite change and fatigue.  HEENT: Denies photophobia, eye pain, redness, hearing loss, ear pain, congestion, sore throat, rhinorrhea, sneezing, mouth sores, trouble swallowing, neck pain, neck stiffness and tinnitus.   Respiratory: Denies SOB, DOE, cough, chest tightness,  and wheezing.   Cardiovascular: Denies chest pain, palpitations and leg swelling.  Gastrointestinal: Denies nausea, vomiting, abdominal pain, diarrhea, constipation, blood in stool and abdominal distention.  Genitourinary: Denies dysuria, urgency, frequency, hematuria, flank pain and difficulty urinating.  Endocrine: Denies: hot or cold intolerance, sweats, changes in hair or nails, polyuria, polydipsia. Musculoskeletal: Denies myalgias, back pain, joint swelling, arthralgias and gait problem.   Skin: Denies pallor, rash and wound.  Neurological: Denies dizziness, seizures, syncope, weakness, light-headedness, numbness and headaches.  Hematological: Denies adenopathy. Easy bruising, personal or family bleeding history  Psychiatric/Behavioral: Denies suicidal ideation, mood changes, confusion, nervousness, sleep disturbance and agitation    Physical Exam: Vitals:   07/14/21 0938  BP: (!) 150/90  Pulse: 64  Temp: 97.6 F (36.4 C)  TempSrc: Oral  SpO2: 96%  Weight: 204 lb 1.6 oz (92.6 kg)    Body mass index is 29.29 kg/m.   Constitutional: NAD, calm, comfortable Eyes: PERRL, lids and conjunctivae normal ENMT: Mucous membranes are moist.  Respiratory: clear to auscultation bilaterally, no wheezing, no crackles. Normal respiratory effort. No accessory muscle use.  Cardiovascular: Regular rate and rhythm, no murmurs /  rubs / gallops. No extremity edema.  Psychiatric: Normal judgment and insight. Alert and oriented x 3. Normal mood.    Impression and Plan:  Cardiac arrhythmia, unspecified cardiac arrhythmia type  - Plan: EKG 12-Lead -Heart rhythm appeared normal today.  EKG was done in office and interpreted by myself as sinus rhythm at a rate of 60 no extrasystolic beats, no ST or T wave changes.   -No PVCs are evident on EKG today, given lack of symptoms do not believe further work-up is necessary at this time.  Time spent: 23 minutes reviewing chart, interviewing wife and patient, examining patient and formulating plan of care.     Lelon Frohlich, MD [facility] at [facility]

## 2021-07-20 DIAGNOSIS — Z20822 Contact with and (suspected) exposure to covid-19: Secondary | ICD-10-CM | POA: Diagnosis not present

## 2021-08-15 ENCOUNTER — Telehealth: Payer: Medicare HMO | Admitting: Internal Medicine

## 2021-08-15 ENCOUNTER — Telehealth: Payer: Self-pay | Admitting: Internal Medicine

## 2021-08-15 DIAGNOSIS — J069 Acute upper respiratory infection, unspecified: Secondary | ICD-10-CM

## 2021-08-15 NOTE — Telephone Encounter (Signed)
Virtual appt today; noted

## 2021-08-15 NOTE — Progress Notes (Signed)
He was scheduled for virtual visit today.  I have called all 3 numbers listed in chart and left voicemail on both cell phones.  I have been unable to reach him.  Message was left to reschedule appointment.  Domingo Mend, MD [facility] at [facility]

## 2021-08-15 NOTE — Telephone Encounter (Signed)
Patient calling in with respiratory symptoms: Shortness of breath, chest pain, palpitations or other red words send to Triage  Does the patient have a fever over 100, cough, congestion, sore throat, runny nose, lost of taste/smell (please list symptoms that patient has)? runny nose/cough/chest congestion  What date did symptoms start? 08/13/21 (If over 5 days ago, pt may be scheduled for in person visit)  Have you tested for Covid in the last 5 days? No   If yes, was it positive []  OR negative [] ? If positive in the last 5 days, please schedule virtual visit now. If negative, schedule for an in person OV with the next available provider if PCP has no openings. Please also let patient know they will be tested again (follow the script below)  "you will have to arrive 27mins prior to your appt time to be Covid tested. Please park in back of office at the cone & call [phone] to let the staff know you have arrived. A staff member will meet you at your car to do a rapid covid test. Once the test has resulted you will be notified by phone of your results to determine if appt will remain an in person visit or be converted to a virtual/phone visit. If you arrive less than 50mins before your appt time, your visit will be automatically converted to virtual & any recommended testing will happen AFTER the visit."   [place]  If no availability for virtual visit in office,  please schedule another [place] office  If no availability at another [place] office, please instruct patient that they can schedule an evisit or virtual visit through their mychart account. Visits up to 8pm  patients can be seen in office 5 days after positive COVID test

## 2021-09-26 ENCOUNTER — Ambulatory Visit: Payer: Medicare Other | Admitting: Neurology

## 2021-09-27 ENCOUNTER — Ambulatory Visit: Payer: Medicare Other | Admitting: Neurology

## 2021-09-27 NOTE — Progress Notes (Unsigned)
PATIENT: Joshua Lopez DOB: July 11, 1930  REASON FOR VISIT: Initial visit for CPAP HISTORY FROM: Patient PRIMARY NEUROLOGIST: Dr. Frances Furbish   HISTORY OF PRESENT ILLNESS: Today 09/27/21 Joshua Lopez here today for initial CPAP visit. Split night sleep study 06/24/2021 showed severe OSA.  HISTORY 06/08/2021 Dr. Frances Furbish: Joshua Lopez is an 88 year old gentleman with an underlying medical history of memory loss, hypertension, hyperlipidemia, reflux disease, BPH, arthritis, subclavian steal syndrome and overweight state, who presents for evaluation of his sleepiness.  He was referred by Dr. Anne Hahn.  The patient is accompanied by his wife today.  He last saw Dr. Anne Hahn in September 2022.  He has been on Aricept.  He sleeps extended hours and is sleepy during the day.  His wife provides most of his history.  He snores mildly, he has sleep disruption due to nocturia, no trouble falling asleep or maintaining sleep otherwise, he goes to the bathroom about 3 times on an average night.  He is to bed around 830 but has already dozed off in the chair for then.  His rise time is around 7:30 AM.  He drinks decaf coffee, typically 1 serving of regular soda with caffeine during the day.  He does not drink any alcohol and quit smoking in 1975.  He lives with his wife, they have 1 daughter who is 5, another daughter passed away at age 16.  They have 5 grandchildren and 7 great-grandchildren.  He had a fall about 2 to 3 months ago.  He has significant daytime somnolence and often dozes off when sedentary.  He does not drive.  He denies recurrent morning headaches.  His Epworth sleepiness score is 22/24, fatigue severity score is 40 out of 63.   REVIEW OF SYSTEMS: Out of a complete 14 system review of symptoms, the patient complains only of the following symptoms, and all other reviewed systems are negative.  ALLERGIES: Allergies  Allergen Reactions   Aspirin Rash and Other (See Comments)    Dyspnea, eyelid edema, truncal  urticaria--Patient tolerates other NSAIDS at home Because of a history of documented adverse serious drug reaction;Medi Alert bracelet  is recommended Other reaction(s): Other (See Comments) Dyspnea, eyelid edema, truncal urticaria--Patient tolerates other NSAIDS at home Because of a history of documented adverse serious drug reaction;Medi Alert bracelet  is recommended  Wife states pt is highly allergic     HOME MEDICATIONS: Outpatient Medications Prior to Visit  Medication Sig Dispense Refill   acetaminophen (TYLENOL) 500 MG tablet Take 500 mg by mouth every 6 (six) hours as needed. For pain (Patient not taking: Reported on 08/15/2021)     albuterol (PROVENTIL HFA;VENTOLIN HFA) 108 (90 Base) MCG/ACT inhaler Inhale 2 puffs into the lungs every 6 (six) hours as needed for wheezing or shortness of breath. (Patient not taking: Reported on 08/15/2021) 1 Inhaler 0   Calcium Carbonate-Vitamin D (CALTRATE 600+D PO) Take 1 tablet by mouth 2 (two) times daily.      donepezil (ARICEPT) 5 MG tablet Take 1 tablet (5 mg total) by mouth at bedtime. 90 tablet 3   escitalopram (LEXAPRO) 5 MG tablet TAKE 1 TABLET EVERY DAY 90 tablet 1   LORazepam (ATIVAN) 1 MG tablet Take 1 tablet (1 mg total) by mouth at bedtime as needed for anxiety. (Patient not taking: Reported on 08/15/2021) 15 tablet 0   montelukast (SINGULAIR) 10 MG tablet TAKE 1 TABLET AT BEDTIME 90 tablet 1   pantoprazole (PROTONIX) 40 MG tablet TAKE 1 TABLET EVERY DAY 90  tablet 1   polyethylene glycol (MIRALAX / GLYCOLAX) packet Take 17 g by mouth 2 (two) times daily.  (Patient not taking: Reported on 08/15/2021)     pravastatin (PRAVACHOL) 20 MG tablet TAKE 1 TABLET AT BEDTIME 90 tablet 1   tamsulosin (FLOMAX) 0.4 MG CAPS capsule Take 1 capsule (0.4 mg total) by mouth at bedtime. 90 capsule 1   No facility-administered medications prior to visit.    PAST MEDICAL HISTORY: Past Medical History:  Diagnosis Date   Alzheimer disease ([facility]) 08/26/2020    Arthritis    diffuse   BPH (benign prostatic hypertrophy)    Dr. Isabel Caprice monitors  elevated PSA   Chronic lower back pain 05-10-12   States pain is related to hip   Colon polyp    2006 and 2010   Esophageal stricture    GERD (gastroesophageal reflux disease)    Hyperlipidemia    NMR  2007   Hypertension    Nasal polyps    Subclavian steal syndrome 1997    PAST SURGICAL HISTORY: Past Surgical History:  Procedure Laterality Date   BACK SURGERY  05-10-12   lumbar fusion with hardware   CATARACT EXTRACTION W/ INTRAOCULAR LENS  IMPLANT, BILATERAL     COLONOSCOPY W/ POLYPECTOMY  2006 & 2010   ESOPHAGEAL DILATION  2003   GANGLION CYST EXCISION  1999   LUE   GANGLION CYST EXCISION  05/14/2012   Procedure: REMOVAL GANGLION OF WRIST;  Surgeon: Kathryne Hitch, MD;  Location: [facility];  Service: Orthopedics;  Laterality: Right;  Excision Right Wrist Ganglion Cyst   JOINT REPLACEMENT     KNEE ARTHROSCOPY  2001   left   POSTERIOR FUSION LUMBAR SPINE  02/08/12   L2-L4   PROSTATE BIOPSY  2000   Dr. Earlene Plater   SUBCLAVIAN STENT PLACEMENT  1997   right side   TOTAL HIP ARTHROPLASTY  05/14/2012   Procedure: TOTAL HIP ARTHROPLASTY ANTERIOR APPROACH;  Surgeon: Kathryne Hitch, MD;  Location: [facility];  Service: Orthopedics;  Laterality: Right;  Right Total Hip Arthroplasty, Anterior Approach (C-Arm)   TOTAL SHOULDER ARTHROPLASTY  2010   Dr. Dion Saucier; right    FAMILY HISTORY: Family History  Problem Relation Age of Onset   Hypertension Mother    Skin cancer Mother    Sudden death Father        murdered   Skin cancer Brother    Hypertension Brother    Coronary artery disease Brother        CABG   Breast cancer Paternal Aunt    Colon cancer Paternal Aunt    Colon cancer Paternal Uncle    Heart attack Paternal Uncle    Breast cancer Paternal Grandmother    Kidney disease Neg Hx    Diabetes Neg Hx    Alzheimer's disease Neg Hx    Dementia Neg Hx    Sleep apnea Neg Hx      SOCIAL HISTORY: Social History   Socioeconomic History   Marital status: Married    Spouse name: Not on file   Number of children: 2   Years of education: Not on file   Highest education level: 7th grade  Occupational History   Not on file  Tobacco Use   Smoking status: Former    Packs/day: 0.50    Years: 33.00    Pack years: 16.50    Types: Cigarettes    Quit date: 07/24/1973    Years since quitting: 48.2   Smokeless tobacco:  Former    Types: Chew    Quit date: 07/24/1974   Tobacco comments:    smoked 1949- 1975 , up to 1/3 ppd  Substance and Sexual Activity   Alcohol use: No   Drug use: No   Sexual activity: Yes  Other Topics Concern   Not on file  Social History Narrative   Lives at home with wife   Right handed   Caffeine: 1 cup/day of decaf coffee   Social Determinants of Health   Financial Resource Strain: Not on file  Food Insecurity: Not on file  Transportation Needs: Not on file  Physical Activity: Not on file  Stress: Not on file  Social Connections: Not on file  Intimate Partner Violence: Not on file      PHYSICAL EXAM  There were no vitals filed for this visit. There is no height or weight on file to calculate BMI.  Generalized: Well developed, in no acute distress   Neurological examination  Mentation: Alert oriented to time, place, history taking. Follows all commands speech and language fluent Cranial nerve II-XII: Pupils were equal round reactive to light. Extraocular movements were full, visual field were full on confrontational test. Facial sensation and strength were normal. Uvula tongue midline. Head turning and shoulder shrug  were normal and symmetric. Motor: The motor testing reveals 5 over 5 strength of all 4 extremities. Good symmetric motor tone is noted throughout.  Sensory: Sensory testing is intact to soft touch on all 4 extremities. No evidence of extinction is noted.  Coordination: Cerebellar testing reveals good  finger-nose-finger and heel-to-shin bilaterally.  Gait and station: Gait is normal. Tandem gait is normal. Romberg is negative. No drift is seen.  Reflexes: Deep tendon reflexes are symmetric and normal bilaterally.   DIAGNOSTIC DATA (LABS, IMAGING, TESTING) - I reviewed patient records, labs, notes, testing and imaging myself where available.  Lab Results  Component Value Date   WBC 5.4 11/04/2020   HGB 13.7 11/04/2020   HCT 39.8 11/04/2020   MCV 92.9 11/04/2020   PLT 160.0 11/04/2020      Component Value Date/Time   NA 140 11/04/2020 0821   K 4.0 11/04/2020 0821   CL 105 11/04/2020 0821   CO2 28 11/04/2020 0821   GLUCOSE 97 11/04/2020 0821   BUN 25 (H) 11/04/2020 0821   CREATININE 1.19 11/04/2020 0821   CALCIUM 8.9 11/04/2020 0821   PROT 6.3 11/04/2020 0821   ALBUMIN 4.0 11/04/2020 0821   AST 13 11/04/2020 0821   ALT 12 11/04/2020 0821   ALKPHOS 76 11/04/2020 0821   BILITOT 1.0 11/04/2020 0821   GFRNONAA 61 (L) 05/15/2012 0445   GFRAA 71 (L) 05/15/2012 0445   Lab Results  Component Value Date   CHOL 137 11/04/2020   HDL 58.00 11/04/2020   LDLCALC 58 11/04/2020   TRIG 103.0 11/04/2020   CHOLHDL 2 11/04/2020   Lab Results  Component Value Date   HGBA1C 6.2 10/11/2018   Lab Results  Component Value Date   VITAMINB12 394 08/26/2020   Lab Results  Component Value Date   TSH 3.76 10/22/2019      ASSESSMENT AND PLAN 88 y.o. year old male       Otila Kluver, DNP 09/27/2021, 5:21 AM [facility] [address], [address] [place], [place] [address] [phone]

## 2021-09-29 ENCOUNTER — Encounter: Payer: Self-pay | Admitting: Neurology

## 2021-09-29 ENCOUNTER — Ambulatory Visit: Payer: Medicare HMO | Admitting: Neurology

## 2021-09-29 VITALS — BP 171/87 | HR 60 | Ht 70.0 in | Wt 195.0 lb

## 2021-09-29 DIAGNOSIS — G4733 Obstructive sleep apnea (adult) (pediatric): Secondary | ICD-10-CM | POA: Insufficient documentation

## 2021-09-29 DIAGNOSIS — R69 Illness, unspecified: Secondary | ICD-10-CM | POA: Diagnosis not present

## 2021-09-29 DIAGNOSIS — G309 Alzheimer's disease, unspecified: Secondary | ICD-10-CM

## 2021-09-29 DIAGNOSIS — F0280 Dementia in other diseases classified elsewhere without behavioral disturbance: Secondary | ICD-10-CM | POA: Diagnosis not present

## 2021-09-29 NOTE — Patient Instructions (Addendum)
Try to use the CPAP, call [facility] [phone] to ask for CPAP to be delivered  ? ?Once you start using, let me know, call our office will need to be seen within 30-90 days of use ?

## 2021-09-29 NOTE — Progress Notes (Signed)
PATIENT: Joshua Lopez DOB: 24-Jun-1930  REASON FOR VISIT: Initial visit for CPAP HISTORY FROM: Patient, wife PRIMARY NEUROLOGIST: Dr. Frances Furbish   HISTORY OF PRESENT ILLNESS: Today 09/29/21 Joshua Lopez here today for initial CPAP visit. Split night sleep study 06/24/2021 showed severe OSA. Joshua Lopez never got the CPAP. Joshua Lopez didn't want to try it, Joshua Lopez doesn't think Joshua Lopez can wear it, but Joshua Lopez never tried it. Memory declines overtime. Is agreeable most times. Tells me about pipe that burst 2 months ago, flooded some area of the basement. On Aricept, can only tolerate 5 mg. Open to CPAP, they just didn't understand the process.   HISTORY 06/08/2021 Dr. Frances Furbish: Joshua Lopez is an 88 year old gentleman with an underlying medical history of memory loss, hypertension, hyperlipidemia, reflux disease, BPH, arthritis, subclavian steal syndrome and overweight state, who presents for evaluation of his sleepiness.  Joshua Lopez was referred by Dr. Anne Hahn.  The patient is accompanied by his wife today.  Joshua Lopez last saw Dr. Anne Hahn in September 2022.  Joshua Lopez has been on Aricept.  Joshua Lopez sleeps extended hours and is sleepy during the day.  His wife provides most of his history.  Joshua Lopez snores mildly, Joshua Lopez has sleep disruption due to nocturia, no trouble falling asleep or maintaining sleep otherwise, Joshua Lopez goes to the bathroom about 3 times on an average night.  Joshua Lopez is to bed around 830 but has already dozed off in the chair for then.  His rise time is around 7:30 AM.  Joshua Lopez drinks decaf coffee, typically 1 serving of regular soda with caffeine during the day.  Joshua Lopez does not drink any alcohol and quit smoking in 1975.  Joshua Lopez lives with his wife, they have 1 daughter who is 74, another daughter passed away at age 54.  They have 5 grandchildren and 7 great-grandchildren.  Joshua Lopez had a fall about 2 to 3 months ago.  Joshua Lopez has significant daytime somnolence and often dozes off when sedentary.  Joshua Lopez does not drive.  Joshua Lopez denies recurrent morning headaches.  His Epworth sleepiness score is 22/24,  fatigue severity score is 40 out of 63.   REVIEW OF SYSTEMS: Out of a complete 14 system review of symptoms, the patient complains only of the following symptoms, and all other reviewed systems are negative.  See HPI  ALLERGIES: Allergies  Allergen Reactions   Aspirin Rash and Other (See Comments)    Dyspnea, eyelid edema, truncal urticaria--Patient tolerates other NSAIDS at home Because of a history of documented adverse serious drug reaction;Medi Alert bracelet  is recommended Other reaction(s): Other (See Comments) Dyspnea, eyelid edema, truncal urticaria--Patient tolerates other NSAIDS at home Because of a history of documented adverse serious drug reaction;Medi Alert bracelet  is recommended  Wife states pt is highly allergic     HOME MEDICATIONS: Outpatient Medications Prior to Visit  Medication Sig Dispense Refill   Calcium Carbonate-Vitamin D (CALTRATE 600+D PO) Take 1 tablet by mouth 2 (two) times daily.      donepezil (ARICEPT) 5 MG tablet Take 1 tablet (5 mg total) by mouth at bedtime. 90 tablet 3   escitalopram (LEXAPRO) 5 MG tablet TAKE 1 TABLET EVERY DAY 90 tablet 1   LORazepam (ATIVAN) 1 MG tablet Take 1 tablet (1 mg total) by mouth at bedtime as needed for anxiety. 15 tablet 0   montelukast (SINGULAIR) 10 MG tablet TAKE 1 TABLET AT BEDTIME 90 tablet 1   pantoprazole (PROTONIX) 40 MG tablet TAKE 1 TABLET EVERY DAY 90 tablet 1   polyethylene glycol (  MIRALAX / GLYCOLAX) packet Take 17 g by mouth 2 (two) times daily.     pravastatin (PRAVACHOL) 20 MG tablet TAKE 1 TABLET AT BEDTIME 90 tablet 1   tamsulosin (FLOMAX) 0.4 MG CAPS capsule Take 1 capsule (0.4 mg total) by mouth at bedtime. 90 capsule 1   acetaminophen (TYLENOL) 500 MG tablet Take 500 mg by mouth every 6 (six) hours as needed. For pain (Patient not taking: Reported on 08/15/2021)     albuterol (PROVENTIL HFA;VENTOLIN HFA) 108 (90 Base) MCG/ACT inhaler Inhale 2 puffs into the lungs every 6 (six) hours as needed  for wheezing or shortness of breath. (Patient not taking: Reported on 08/15/2021) 1 Inhaler 0   No facility-administered medications prior to visit.    PAST MEDICAL HISTORY: Past Medical History:  Diagnosis Date   Alzheimer disease (HCC) 08/26/2020   Arthritis    diffuse   BPH (benign prostatic hypertrophy)    Dr. Isabel Caprice monitors  elevated PSA   Chronic lower back pain 05-10-12   States pain is related to hip   Colon polyp    2006 and 2010   Esophageal stricture    GERD (gastroesophageal reflux disease)    Hyperlipidemia    NMR  2007   Hypertension    Nasal polyps    Subclavian steal syndrome 1997    PAST SURGICAL HISTORY: Past Surgical History:  Procedure Laterality Date   BACK SURGERY  05-10-12   lumbar fusion with hardware   CATARACT EXTRACTION W/ INTRAOCULAR LENS  IMPLANT, BILATERAL     COLONOSCOPY W/ POLYPECTOMY  2006 & 2010   ESOPHAGEAL DILATION  2003   GANGLION CYST EXCISION  1999   LUE   GANGLION CYST EXCISION  05/14/2012   Procedure: REMOVAL GANGLION OF WRIST;  Surgeon: Kathryne Hitch, MD;  Location: [facility];  Service: Orthopedics;  Laterality: Right;  Excision Right Wrist Ganglion Cyst   JOINT REPLACEMENT     KNEE ARTHROSCOPY  2001   left   POSTERIOR FUSION LUMBAR SPINE  02/08/12   L2-L4   PROSTATE BIOPSY  2000   Dr. Earlene Plater   SUBCLAVIAN STENT PLACEMENT  1997   right side   TOTAL HIP ARTHROPLASTY  05/14/2012   Procedure: TOTAL HIP ARTHROPLASTY ANTERIOR APPROACH;  Surgeon: Kathryne Hitch, MD;  Location: [facility];  Service: Orthopedics;  Laterality: Right;  Right Total Hip Arthroplasty, Anterior Approach (C-Arm)   TOTAL SHOULDER ARTHROPLASTY  2010   Dr. Dion Saucier; right    FAMILY HISTORY: Family History  Problem Relation Age of Onset   Hypertension Mother    Skin cancer Mother    Sudden death Father        murdered   Skin cancer Brother    Hypertension Brother    Coronary artery disease Brother        CABG   Breast cancer Paternal Aunt     Colon cancer Paternal Aunt    Colon cancer Paternal Uncle    Heart attack Paternal Uncle    Breast cancer Paternal Grandmother    Kidney disease Neg Hx    Diabetes Neg Hx    Alzheimer's disease Neg Hx    Dementia Neg Hx    Sleep apnea Neg Hx     SOCIAL HISTORY: Social History   Socioeconomic History   Marital status: Married    Spouse name: Not on file   Number of children: 2   Years of education: Not on file   Highest education level: 7th grade  Occupational History   Not on file  Tobacco Use   Smoking status: Former    Packs/day: 0.50    Years: 33.00    Pack years: 16.50    Types: Cigarettes    Quit date: 07/24/1973    Years since quitting: 48.2   Smokeless tobacco: Former    Types: Chew    Quit date: 07/24/1974   Tobacco comments:    smoked 1949- 1975 , up to 1/3 ppd  Substance and Sexual Activity   Alcohol use: No   Drug use: No   Sexual activity: Yes  Other Topics Concern   Not on file  Social History Narrative   Lives at home with wife   Right handed   Caffeine: 1 cup/day of decaf coffee   Social Determinants of Health   Financial Resource Strain: Not on file  Food Insecurity: Not on file  Transportation Needs: Not on file  Physical Activity: Not on file  Stress: Not on file  Social Connections: Not on file  Intimate Partner Violence: Not on file   PHYSICAL EXAM  Vitals:   09/29/21 1523  BP: (!) 171/87  Pulse: 60  Weight: 195 lb (88.5 kg)  Height: 5\' 10"  (1.778 m)   Body mass index is 27.98 kg/m.  Generalized: Well developed, in no acute distress  MMSE - Mini Mental State Exam 03/25/2021 08/26/2020  Orientation to time 3 1  Orientation to Place 4 3  Registration 3 3  Attention/ Calculation 0 1  Recall 3 2  Language- name 2 objects 2 2  Language- repeat 1 0  Language- follow 3 step command 0 2  Language- read & follow direction 1 0  Write a sentence 1 0  Copy design 0 0  Total score 18 14   Neurological examination  Mentation: Alert,  cooperative, hard of hearing, most history provided by wife. Some trouble following exam commands Cranial nerve II-XII: Pupils were equal round reactive to light. Extraocular movements were full, visual field were full on confrontational test. Facial sensation and strength were normal.  Head turning and shoulder shrug  were normal and symmetric. Motor: Good strength all extremities noted Sensory: Sensory testing is intact to soft touch on all 4 extremities. No evidence of extinction is noted.  Coordination: Cerebellar testing reveals good finger-nose-finger and heel-to-shin bilaterally.  Gait and station: Gait is wide-based, cautious  DIAGNOSTIC DATA (LABS, IMAGING, TESTING) - I reviewed patient records, labs, notes, testing and imaging myself where available.  Lab Results  Component Value Date   WBC 5.4 11/04/2020   HGB 13.7 11/04/2020   HCT 39.8 11/04/2020   MCV 92.9 11/04/2020   PLT 160.0 11/04/2020      Component Value Date/Time   NA 140 11/04/2020 0821   K 4.0 11/04/2020 0821   CL 105 11/04/2020 0821   CO2 28 11/04/2020 0821   GLUCOSE 97 11/04/2020 0821   BUN 25 (H) 11/04/2020 0821   CREATININE 1.19 11/04/2020 0821   CALCIUM 8.9 11/04/2020 0821   PROT 6.3 11/04/2020 0821   ALBUMIN 4.0 11/04/2020 0821   AST 13 11/04/2020 0821   ALT 12 11/04/2020 0821   ALKPHOS 76 11/04/2020 0821   BILITOT 1.0 11/04/2020 0821   GFRNONAA 61 (L) 05/15/2012 0445   GFRAA 71 (L) 05/15/2012 0445   Lab Results  Component Value Date   CHOL 137 11/04/2020   HDL 58.00 11/04/2020   LDLCALC 58 11/04/2020   TRIG 103.0 11/04/2020   CHOLHDL 2 11/04/2020  Lab Results  Component Value Date   HGBA1C 6.2 10/11/2018   Lab Results  Component Value Date   VITAMINB12 394 08/26/2020   Lab Results  Component Value Date   TSH 3.76 10/22/2019    ASSESSMENT AND PLAN 88 y.o. year old male   1.  Dementia 2.  Obstructive sleep apnea, severe  -Given # for [facility] to call and have CPAP delivered,  they will try CPAP, once started let me know, will need to be seen within 30 to 90 days for compliance; we spent a long time discussing about the CPAP machine, process, and benefits -Will schedule a 6 month follow up for memory, Joshua Lopez has an underlying Dementia process, but hopefully the memory will benefit in some degree once OSA is treated  Margie Ege, AGNP-C, DNP 09/29/2021, 3:52 PM [facility] [address] [place], [place] [address] [phone]

## 2021-10-04 ENCOUNTER — Telehealth: Payer: Self-pay | Admitting: Neurology

## 2021-10-04 NOTE — Telephone Encounter (Signed)
Called wife, LM with son in law to have her call me back.  ?

## 2021-10-04 NOTE — Telephone Encounter (Signed)
Refaxed order for cpap to [facility] with fax confirmation received. [phone]. ?

## 2021-10-04 NOTE — Telephone Encounter (Signed)
Wife called back. I relayed that referral was sent to [facility] (received fax confirmation).  They will authorize thru insurance then will call them to discuss.  Will need to go to office for initial fitting of mask and go over machine.  She verbalized understanding.   ?

## 2021-10-04 NOTE — Telephone Encounter (Signed)
Pt's wife, Terius Jacuinde (on [place]) [facility] informed that [facility] would have to send a referral for CPAP machine to be delivered. Would like a call from the nurse to confirm referral has been sent. ?

## 2021-10-12 ENCOUNTER — Other Ambulatory Visit: Payer: Self-pay | Admitting: Internal Medicine

## 2021-10-12 DIAGNOSIS — G4733 Obstructive sleep apnea (adult) (pediatric): Secondary | ICD-10-CM | POA: Diagnosis not present

## 2021-10-13 ENCOUNTER — Encounter: Payer: Self-pay | Admitting: *Deleted

## 2021-10-13 NOTE — Telephone Encounter (Signed)
We received a fax from [facility] stating they had made numerous attempts to contact the patient to coordinate set-up.  They are going to send the patient a notification in the mail as well.  They have to mark the patient as unable to contact.  I called the patient's wife Letta Median (on [place]) twice and her phone line was busy.  I called the patient's daughter Hilda Blades (on [place]) and LVM asking for a call back from her or the wife.  ?

## 2021-10-13 NOTE — Telephone Encounter (Addendum)
I called daughter and spoke to wife.  Made appt for pt for initial cpap set up 10-13-21. 11-29-2021 at 0815. DME [facility].  She verbalized understanding. Mailed letter with appt date time.  ?

## 2021-10-24 ENCOUNTER — Telehealth: Payer: Self-pay

## 2021-10-24 DIAGNOSIS — Z85828 Personal history of other malignant neoplasm of skin: Secondary | ICD-10-CM | POA: Diagnosis not present

## 2021-10-24 DIAGNOSIS — L308 Other specified dermatitis: Secondary | ICD-10-CM | POA: Diagnosis not present

## 2021-10-24 DIAGNOSIS — L821 Other seborrheic keratosis: Secondary | ICD-10-CM | POA: Diagnosis not present

## 2021-10-24 NOTE — Telephone Encounter (Signed)
Last AWV 11/04/20 by PCP. Pt needs either 40mof/u or AWV scheduled with PCP. ?

## 2021-10-25 ENCOUNTER — Telehealth: Payer: Self-pay | Admitting: Internal Medicine

## 2021-10-25 NOTE — Telephone Encounter (Signed)
Patient spouse stated that [employer] informed her to tell Dr.Hernandez to call them to set up for patient to get her medication shipped through the mail.  ? ?She said the pharmacy is [facility] ? ?The number is [phone]. ? ?Fax number: [phone] ? ?Please advise ?

## 2021-10-25 NOTE — Telephone Encounter (Signed)
noted 

## 2021-11-01 ENCOUNTER — Encounter: Payer: Self-pay | Admitting: *Deleted

## 2021-11-01 NOTE — Telephone Encounter (Addendum)
Received a fax from [facility] which stated the pt's wife returned his CPAP machine on 10/20/21 because he cannot tolerate it and refuses to wear it. Sarah NP aware. Cancel the initial cpap appt for May. Will see him at f/u in September.  ? ?Appt canceled. I tried to call the wife to let her know we had canceled the appt but couldn't reach her and there was no ability to LVM. If she calls back, please let her know the 5/9 appt was canceled because he is not on CPAP. We will keep his appointment for April 06, 2022 at 3:15 PM. ? ?I went ahead and mailed a letter to pt's wife updating her on appointments.  ?

## 2021-11-29 ENCOUNTER — Ambulatory Visit: Payer: Medicare HMO | Admitting: Adult Health

## 2021-11-29 ENCOUNTER — Ambulatory Visit: Payer: Medicare HMO | Admitting: Neurology

## 2022-01-31 DIAGNOSIS — N401 Enlarged prostate with lower urinary tract symptoms: Secondary | ICD-10-CM | POA: Diagnosis not present

## 2022-01-31 DIAGNOSIS — R3912 Poor urinary stream: Secondary | ICD-10-CM | POA: Diagnosis not present

## 2022-01-31 DIAGNOSIS — R351 Nocturia: Secondary | ICD-10-CM | POA: Diagnosis not present

## 2022-02-06 DIAGNOSIS — Z961 Presence of intraocular lens: Secondary | ICD-10-CM | POA: Diagnosis not present

## 2022-02-06 DIAGNOSIS — H353220 Exudative age-related macular degeneration, left eye, stage unspecified: Secondary | ICD-10-CM | POA: Diagnosis not present

## 2022-02-06 DIAGNOSIS — H353111 Nonexudative age-related macular degeneration, right eye, early dry stage: Secondary | ICD-10-CM | POA: Diagnosis not present

## 2022-03-15 ENCOUNTER — Other Ambulatory Visit: Payer: Self-pay

## 2022-03-15 NOTE — Patient Outreach (Signed)
Care Coordination   03/15/2022 Name: Joshua Lopez MRN: [id number] DOB: 1929/10/08   Care Coordination Outreach Attempts:  An unsuccessful telephone outreach was attempted today to offer the patient information about available care coordination services as a benefit of their health plan.   Follow Up Plan:  Additional outreach attempts will be made to offer the patient care coordination information and services.   Encounter Outcome:  No Answer  Care Coordination Interventions Activated:  No   Care Coordination Interventions:  No, not indicated    Dudley Major RN, BSN,CCM, CDE Care Management Coordinator [facility] Care Management [phone]

## 2022-03-20 ENCOUNTER — Other Ambulatory Visit: Payer: Self-pay

## 2022-03-20 NOTE — Patient Outreach (Signed)
Care Coordination   03/20/2022 Name: TRISDEN PIPPIN MRN: [id number] DOB: August 03, 1929   Care Coordination Outreach Attempts:  A second unsuccessful outreach was attempted today to offer the patient with information about available care coordination services as a benefit of their health plan.     Follow Up Plan:  Additional outreach attempts will be made to offer the patient care coordination information and services.   Encounter Outcome:  No Answer  Care Coordination Interventions Activated:  No   Care Coordination Interventions:  No, not indicated    Dudley Major RN, BSN,CCM, CDE Care Management Coordinator [facility] Care Management [phone]

## 2022-03-29 ENCOUNTER — Telehealth: Payer: Self-pay

## 2022-03-29 NOTE — Patient Outreach (Signed)
Care Coordination   03/29/2022 Name: Joshua Lopez MRN: [id number] DOB: 02-16-1930   Care Coordination Outreach Attempts:  A third unsuccessful outreach was attempted today to offer the patient with information about available care coordination services as a benefit of their health plan.   Follow Up Plan:  No further outreach attempts will be made at this time. We have been unable to contact the patient to offer or enroll patient in care coordination services  Encounter Outcome:  No Answer  Care Coordination Interventions Activated:  No   Care Coordination Interventions:  No, not indicated    Bevelyn Ngo, BSW, CDP Social Worker, Certified Dementia Practitioner Care Coordination [phone]

## 2022-03-31 ENCOUNTER — Encounter: Payer: Self-pay | Admitting: Family Medicine

## 2022-03-31 ENCOUNTER — Ambulatory Visit (INDEPENDENT_AMBULATORY_CARE_PROVIDER_SITE_OTHER): Payer: Medicare HMO | Admitting: Family Medicine

## 2022-03-31 VITALS — BP 138/78 | HR 67 | Temp 97.9°F | Wt 184.0 lb

## 2022-03-31 DIAGNOSIS — N390 Urinary tract infection, site not specified: Secondary | ICD-10-CM

## 2022-03-31 DIAGNOSIS — R300 Dysuria: Secondary | ICD-10-CM | POA: Diagnosis not present

## 2022-03-31 LAB — POC URINALSYSI DIPSTICK (AUTOMATED)
Blood, UA: NEGATIVE
Glucose, UA: NEGATIVE
Leukocytes, UA: NEGATIVE
Nitrite, UA: NEGATIVE
Protein, UA: POSITIVE — AB
Spec Grav, UA: >=1.03 — AB (ref 1.010–1.025)
Urobilinogen, UA: 1 E.U./dL
pH, UA: 6 (ref 5.0–8.0)

## 2022-03-31 MED ORDER — CIPROFLOXACIN HCL 500 MG PO TABS: 500.0000 mg | ORAL_TABLET | Freq: Two times a day (BID) | ORAL | 0 refills | Status: AC

## 2022-03-31 NOTE — Progress Notes (Signed)
Subjective:    Patient ID: Joshua Lopez, male    DOB: 1930/06/09, 88 y.o.   MRN: [id number]  HPI Here with a family member for several days of urgency to urinate and burning with urination. The hx is obtained solely from the family member. No fevers. His behavior has not changed from baseline. He does not have a hx of UTI's, but apparently about one month ago he began wearing protective underpants in bed because he has been incontinent.    Review of Systems  Constitutional: Negative.   Respiratory: Negative.    Cardiovascular: Negative.   Gastrointestinal: Negative.   Genitourinary:  Positive for dysuria and urgency. Negative for frequency and hematuria.       Objective:   Physical Exam Constitutional:      General: He is not in acute distress.    Appearance: Normal appearance.  Cardiovascular:     Rate and Rhythm: Normal rate and regular rhythm.     Pulses: Normal pulses.     Heart sounds: Normal heart sounds.  Pulmonary:     Effort: Pulmonary effort is normal.     Breath sounds: Normal breath sounds.  Abdominal:     Tenderness: There is no right CVA tenderness or left CVA tenderness.  Neurological:     Mental Status: He is alert.           Assessment & Plan:  UTI, treat with 10 days of Cipro. Drink plenty of water. We will culture the sample. Gershon Crane, MD

## 2022-03-31 NOTE — Addendum Note (Signed)
Addended by: Rosalyn Gess D on: 03/31/2022 02:59 PM   Modules accepted: Orders

## 2022-04-01 LAB — URINE CULTURE
MICRO NUMBER:: 13891767
SPECIMEN QUALITY:: ADEQUATE

## 2022-04-04 ENCOUNTER — Telehealth: Payer: Self-pay | Admitting: Internal Medicine

## 2022-04-04 NOTE — Telephone Encounter (Signed)
Patient's daughter dropped off paperwork needing to be completed by Dr.Hernandez. Paperwork was placed in folder for completion.      Please advise

## 2022-04-04 NOTE — Telephone Encounter (Signed)
Form placed in Dr Ledell Noss folder.

## 2022-04-05 NOTE — Progress Notes (Signed)
PATIENT: CASMER LANE DOB: 03-09-1930  REASON FOR VISIT: Follow-up for memory HISTORY FROM: Patient, wife PRIMARY NEUROLOGIST: Dr. Frances Furbish   HISTORY OF PRESENT ILLNESS: Today 04/06/22 Mr. Essa is here today for follow-up for memory. He tried CPAP but couldn't tolerate the machine, they returned it. He doesn't hear well, he didn't want hearing aids. His wife helps with showering, dressing. She manages his medications. He sleeps a lot in the chair. He doesn't drive. He remains on Aricept. He has had a few falls, stumbles. Hasn't hit his head. Has to ask family to help him get up. Currently being treated for UTI. Good appetite with small portions. He has dreams at night, talks in his sleep. His wife is checking into adult day program about a few days. He is declining. He had a hard time with MMSE 11/30.  Update 09/29/21 SS: Mr. Gamlin here today for initial CPAP visit. Split night sleep study 06/24/2021 showed severe OSA. He never got the CPAP. He didn't want to try it, he doesn't think he can wear it, but he never tried it. Memory declines overtime. Is agreeable most times. Tells me about pipe that burst 2 months ago, flooded some area of the basement. On Aricept, can only tolerate 5 mg. Open to CPAP, they just didn't understand the process.   HISTORY 06/08/2021 Dr. Frances Furbish: Mr. Estime is an 88 year old gentleman with an underlying medical history of memory loss, hypertension, hyperlipidemia, reflux disease, BPH, arthritis, subclavian steal syndrome and overweight state, who presents for evaluation of his sleepiness.  He was referred by Dr. Anne Hahn.  The patient is accompanied by his wife today.  He last saw Dr. Anne Hahn in September 2022.  He has been on Aricept.  He sleeps extended hours and is sleepy during the day.  His wife provides most of his history.  He snores mildly, he has sleep disruption due to nocturia, no trouble falling asleep or maintaining sleep otherwise, he goes to the bathroom about 3  times on an average night.  He is to bed around 830 but has already dozed off in the chair for then.  His rise time is around 7:30 AM.  He drinks decaf coffee, typically 1 serving of regular soda with caffeine during the day.  He does not drink any alcohol and quit smoking in 1975.  He lives with his wife, they have 1 daughter who is 86, another daughter passed away at age 86.  They have 5 grandchildren and 7 great-grandchildren.  He had a fall about 2 to 3 months ago.  He has significant daytime somnolence and often dozes off when sedentary.  He does not drive.  He denies recurrent morning headaches.  His Epworth sleepiness score is 22/24, fatigue severity score is 40 out of 63.   REVIEW OF SYSTEMS: Out of a complete 14 system review of symptoms, the patient complains only of the following symptoms, and all other reviewed systems are negative.  See HPI  ALLERGIES: Allergies  Allergen Reactions   Aspirin Rash and Other (See Comments)    Dyspnea, eyelid edema, truncal urticaria--Patient tolerates other NSAIDS at home Because of a history of documented adverse serious drug reaction;Medi Alert bracelet  is recommended Other reaction(s): Other (See Comments) Dyspnea, eyelid edema, truncal urticaria--Patient tolerates other NSAIDS at home Because of a history of documented adverse serious drug reaction;Medi Alert bracelet  is recommended  Wife states pt is highly allergic     HOME MEDICATIONS: Outpatient Medications Prior to Visit  Medication Sig Dispense Refill   Calcium Carbonate-Vitamin D (CALTRATE 600+D PO) Take 1 tablet by mouth 2 (two) times daily.      ciprofloxacin (CIPRO) 500 MG tablet Take 1 tablet (500 mg total) by mouth 2 (two) times daily for 10 days. 20 tablet 0   escitalopram (LEXAPRO) 5 MG tablet TAKE 1 TABLET EVERY DAY 90 tablet 1   LORazepam (ATIVAN) 1 MG tablet Take 1 tablet (1 mg total) by mouth at bedtime as needed for anxiety. 15 tablet 0   montelukast (SINGULAIR) 10 MG  tablet TAKE 1 TABLET AT BEDTIME 90 tablet 0   pantoprazole (PROTONIX) 40 MG tablet TAKE 1 TABLET EVERY DAY 90 tablet 0   polyethylene glycol (MIRALAX / GLYCOLAX) packet Take 17 g by mouth 2 (two) times daily.     pravastatin (PRAVACHOL) 20 MG tablet TAKE 1 TABLET AT BEDTIME 90 tablet 0   tamsulosin (FLOMAX) 0.4 MG CAPS capsule Take 1 capsule (0.4 mg total) by mouth at bedtime. 90 capsule 1   donepezil (ARICEPT) 5 MG tablet Take 1 tablet (5 mg total) by mouth at bedtime. 90 tablet 3   No facility-administered medications prior to visit.    PAST MEDICAL HISTORY: Past Medical History:  Diagnosis Date   Alzheimer disease (HCC) 08/26/2020   Arthritis    diffuse   BPH (benign prostatic hypertrophy)    Dr. Isabel Caprice monitors  elevated PSA   Chronic lower back pain 05-10-12   States pain is related to hip   Colon polyp    2006 and 2010   Esophageal stricture    GERD (gastroesophageal reflux disease)    Hyperlipidemia    NMR  2007   Hypertension    Nasal polyps    Subclavian steal syndrome 1997    PAST SURGICAL HISTORY: Past Surgical History:  Procedure Laterality Date   BACK SURGERY  05-10-12   lumbar fusion with hardware   CATARACT EXTRACTION W/ INTRAOCULAR LENS  IMPLANT, BILATERAL     COLONOSCOPY W/ POLYPECTOMY  2006 & 2010   ESOPHAGEAL DILATION  2003   GANGLION CYST EXCISION  1999   LUE   GANGLION CYST EXCISION  05/14/2012   Procedure: REMOVAL GANGLION OF WRIST;  Surgeon: Kathryne Hitch, MD;  Location: [facility];  Service: Orthopedics;  Laterality: Right;  Excision Right Wrist Ganglion Cyst   JOINT REPLACEMENT     KNEE ARTHROSCOPY  2001   left   POSTERIOR FUSION LUMBAR SPINE  02/08/12   L2-L4   PROSTATE BIOPSY  2000   Dr. Earlene Plater   SUBCLAVIAN STENT PLACEMENT  1997   right side   TOTAL HIP ARTHROPLASTY  05/14/2012   Procedure: TOTAL HIP ARTHROPLASTY ANTERIOR APPROACH;  Surgeon: Kathryne Hitch, MD;  Location: [facility];  Service: Orthopedics;  Laterality: Right;  Right  Total Hip Arthroplasty, Anterior Approach (C-Arm)   TOTAL SHOULDER ARTHROPLASTY  2010   Dr. Dion Saucier; right    FAMILY HISTORY: Family History  Problem Relation Age of Onset   Hypertension Mother    Skin cancer Mother    Sudden death Father        murdered   Skin cancer Brother    Hypertension Brother    Coronary artery disease Brother        CABG   Breast cancer Paternal Aunt    Colon cancer Paternal Aunt    Colon cancer Paternal Uncle    Heart attack Paternal Uncle    Breast cancer Paternal Grandmother    Kidney  disease Neg Hx    Diabetes Neg Hx    Alzheimer's disease Neg Hx    Dementia Neg Hx    Sleep apnea Neg Hx     SOCIAL HISTORY: Social History   Socioeconomic History   Marital status: Married    Spouse name: Not on file   Number of children: 2   Years of education: Not on file   Highest education level: 7th grade  Occupational History   Not on file  Tobacco Use   Smoking status: Former    Packs/day: 0.50    Years: 33.00    Total pack years: 16.50    Types: Cigarettes    Quit date: 07/24/1973    Years since quitting: 48.7   Smokeless tobacco: Former    Types: Chew    Quit date: 07/24/1974   Tobacco comments:    smoked 1949- 1975 , up to 1/3 ppd  Substance and Sexual Activity   Alcohol use: No   Drug use: No   Sexual activity: Yes  Other Topics Concern   Not on file  Social History Narrative   Lives at home with wife   Right handed   Caffeine: 1 cup/day of decaf coffee   Social Determinants of Health   Financial Resource Strain: Not on file  Food Insecurity: Not on file  Transportation Needs: Not on file  Physical Activity: Not on file  Stress: Not on file  Social Connections: Not on file  Intimate Partner Violence: Not on file   PHYSICAL EXAM  Vitals:   04/06/22 1524  BP: (!) 169/84  Pulse: (!) 59  Weight: 191 lb (86.6 kg)  Height: 5\' 10"  (1.778 m)    Body mass index is 27.41 kg/m.  Generalized: Well developed, in no acute distress      04/06/2022    3:00 PM 03/25/2021   10:43 AM 08/26/2020   10:31 AM  MMSE - Mini Mental State Exam  Orientation to time 1 3 1   Orientation to Place 0 4 3  Registration 3 3 3   Attention/ Calculation 0 0 1  Recall 1 3 2   Language- name 2 objects 1 2 2   Language- repeat 1 1 0  Language- follow 3 step command 3 0 2  Language- read & follow direction 1 1 0  Write a sentence 0 1 0  Copy design 0 0 0  Total score 11 18 14    Neurological examination  Mentation: Alert, cooperative, hard of hearing, relies on wife for history. Some trouble following exam commands, tired looking Cranial nerve II-XII: Pupils were equal round reactive to light. Extraocular movements were full, visual field were full on confrontational test. Facial sensation and strength were normal.  Head turning and shoulder shrug  were normal and symmetric. Motor: Good strength all extremities noted Sensory: Sensory testing is intact to soft touch on all 4 extremities. No evidence of extinction is noted.  Coordination: Apraxia with finger nose finger; able to perform heel to shin  Gait and station: Gait is wide-based, cautious, no assistive device  DIAGNOSTIC DATA (LABS, IMAGING, TESTING) - I reviewed patient records, labs, notes, testing and imaging myself where available.  Lab Results  Component Value Date   WBC 5.4 11/04/2020   HGB 13.7 11/04/2020   HCT 39.8 11/04/2020   MCV 92.9 11/04/2020   PLT 160.0 11/04/2020      Component Value Date/Time   NA 140 11/04/2020 0821   K 4.0 11/04/2020 0821   CL 105 11/04/2020  0821   CO2 28 11/04/2020 0821   GLUCOSE 97 11/04/2020 0821   BUN 25 (H) 11/04/2020 0821   CREATININE 1.19 11/04/2020 0821   CALCIUM 8.9 11/04/2020 0821   PROT 6.3 11/04/2020 0821   ALBUMIN 4.0 11/04/2020 0821   AST 13 11/04/2020 0821   ALT 12 11/04/2020 0821   ALKPHOS 76 11/04/2020 0821   BILITOT 1.0 11/04/2020 0821   GFRNONAA 61 (L) 05/15/2012 0445   GFRAA 71 (L) 05/15/2012 0445   Lab Results   Component Value Date   CHOL 137 11/04/2020   HDL 58.00 11/04/2020   LDLCALC 58 11/04/2020   TRIG 103.0 11/04/2020   CHOLHDL 2 11/04/2020   Lab Results  Component Value Date   HGBA1C 6.2 10/11/2018   Lab Results  Component Value Date   VITAMINB12 394 08/26/2020   Lab Results  Component Value Date   TSH 3.76 10/22/2019    ASSESSMENT AND PLAN 88 y.o. year old male   1.  Dementia 2.  Obstructive sleep apnea, severe  -He has declined since last seen, MMSE 11/30, his wife is exploring adult day program options, which I fully support -Try Aricept 5 mg in the morning, side effect of dreams at night, has not been able to tolerate 10 mg dosing -We talked about Namenda, will not add at this time, history of falls, worry about potential side effect -Has absolutely not been able to tolerate CPAP -We talked about needing increased supervision, assistance in the future, I will see him back in 6 months or sooner if needed  Margie Ege, Edrick Oh, DNP 04/06/2022, 3:51 PM [facility] [address], [address] [place], [place] [address] [phone]

## 2022-04-06 ENCOUNTER — Encounter: Payer: Self-pay | Admitting: Neurology

## 2022-04-06 ENCOUNTER — Ambulatory Visit: Payer: Medicare HMO | Admitting: Neurology

## 2022-04-06 VITALS — BP 169/84 | HR 59 | Ht 70.0 in | Wt 191.0 lb

## 2022-04-06 DIAGNOSIS — F0280 Dementia in other diseases classified elsewhere without behavioral disturbance: Secondary | ICD-10-CM

## 2022-04-06 DIAGNOSIS — R69 Illness, unspecified: Secondary | ICD-10-CM | POA: Diagnosis not present

## 2022-04-06 DIAGNOSIS — G4733 Obstructive sleep apnea (adult) (pediatric): Secondary | ICD-10-CM

## 2022-04-06 DIAGNOSIS — G309 Alzheimer's disease, unspecified: Secondary | ICD-10-CM

## 2022-04-06 MED ORDER — DONEPEZIL HCL 5 MG PO TABS: 5.0000 mg | ORAL_TABLET | Freq: Every day | ORAL | 3 refills | Status: DC

## 2022-04-06 NOTE — Patient Instructions (Signed)
Start giving the Aricept 5 mg in the morning to see if less dreams/talking at night I will see you back in 6 months

## 2022-04-11 NOTE — Telephone Encounter (Signed)
Papers has been faxed

## 2022-04-18 NOTE — Telephone Encounter (Signed)
Attempted to call the patient, but unable to leave a message. Form placed up front.

## 2022-04-25 ENCOUNTER — Telehealth: Payer: Self-pay | Admitting: Internal Medicine

## 2022-04-25 MED ORDER — ESCITALOPRAM OXALATE 5 MG PO TABS: 5.0000 mg | ORAL_TABLET | Freq: Every day | ORAL | 1 refills | Status: DC

## 2022-04-25 NOTE — Addendum Note (Signed)
Addended by: Westley Hummer B on: 04/25/2022 10:52 AM   Modules accepted: Orders

## 2022-04-25 NOTE — Telephone Encounter (Signed)
Requesting refill last fill 9/22  escitalopram (LEXAPRO) 5 MG tablet

## 2022-06-19 ENCOUNTER — Other Ambulatory Visit: Payer: Self-pay

## 2022-06-19 MED ORDER — MONTELUKAST SODIUM 10 MG PO TABS
10.0000 mg | ORAL_TABLET | Freq: Every day | ORAL | 0 refills | Status: DC
Start: 2022-06-19 — End: 2023-08-01

## 2022-09-29 ENCOUNTER — Ambulatory Visit: Payer: Medicare HMO | Admitting: Adult Health

## 2022-10-02 ENCOUNTER — Ambulatory Visit (INDEPENDENT_AMBULATORY_CARE_PROVIDER_SITE_OTHER): Payer: Medicare HMO | Admitting: Internal Medicine

## 2022-10-02 VITALS — BP 148/84 | HR 65 | Temp 97.5°F | Wt 178.4 lb

## 2022-10-02 DIAGNOSIS — N1832 Chronic kidney disease, stage 3b: Secondary | ICD-10-CM

## 2022-10-02 DIAGNOSIS — G309 Alzheimer's disease, unspecified: Secondary | ICD-10-CM | POA: Diagnosis not present

## 2022-10-02 DIAGNOSIS — R351 Nocturia: Secondary | ICD-10-CM

## 2022-10-02 DIAGNOSIS — N401 Enlarged prostate with lower urinary tract symptoms: Secondary | ICD-10-CM

## 2022-10-02 DIAGNOSIS — I10 Essential (primary) hypertension: Secondary | ICD-10-CM | POA: Diagnosis not present

## 2022-10-02 DIAGNOSIS — F0280 Dementia in other diseases classified elsewhere without behavioral disturbance: Secondary | ICD-10-CM

## 2022-10-02 DIAGNOSIS — R69 Illness, unspecified: Secondary | ICD-10-CM | POA: Diagnosis not present

## 2022-10-02 DIAGNOSIS — E782 Mixed hyperlipidemia: Secondary | ICD-10-CM | POA: Diagnosis not present

## 2022-10-02 NOTE — Progress Notes (Signed)
Established Patient Office Visit     CC/Reason for Visit: Follow-up chronic medical conditions  HPI: Joshua Lopez is a 89 y.o. male who is coming in today for the above mentioned reasons. Past Medical History is significant for: Hypertension, hyperlipidemia, OSA, BPH, Alzheimer's dementia, stage IIIb chronic kidney disease.  He is here with his wife.  His cognitive function continues to decline.  His wife is concerned about his blood pressure which has been on average with systolics in the 0000000 and diastolics in the high [id number] to 90s.  He continues to exhibit reversal of sleep-wake cycle.  This is very difficult on his wife.  She has some family members that assist with care on occasion.  He also attends an adult daycare program once a week.   Past Medical/Surgical History: Past Medical History:  Diagnosis Date   Alzheimer disease ([place]) 08/26/2020   Arthritis    diffuse   BPH (benign prostatic hypertrophy)    Dr. Risa Grill monitors  elevated PSA   Chronic lower back pain 05-10-12   States pain is related to hip   Colon polyp    2006 and 2010   Esophageal stricture    GERD (gastroesophageal reflux disease)    Hyperlipidemia    NMR  2007   Hypertension    Nasal polyps    Subclavian steal syndrome 1997    Past Surgical History:  Procedure Laterality Date   BACK SURGERY  05-10-12   lumbar fusion with hardware   CATARACT EXTRACTION W/ INTRAOCULAR LENS  IMPLANT, BILATERAL     COLONOSCOPY W/ POLYPECTOMY  2006 & 2010   ESOPHAGEAL DILATION  2003   GANGLION CYST EXCISION  1999   LUE   GANGLION CYST EXCISION  05/14/2012   Procedure: REMOVAL GANGLION OF WRIST;  Surgeon: Mcarthur Rossetti, MD;  Location: [facility];  Service: Orthopedics;  Laterality: Right;  Excision Right Wrist Ganglion Cyst   JOINT REPLACEMENT     KNEE ARTHROSCOPY  2001   left   POSTERIOR FUSION LUMBAR SPINE  02/08/12   L2-L4   PROSTATE BIOPSY  2000   Dr. Rosana Hoes   SUBCLAVIAN STENT PLACEMENT  1997   right  side   TOTAL HIP ARTHROPLASTY  05/14/2012   Procedure: TOTAL HIP ARTHROPLASTY ANTERIOR APPROACH;  Surgeon: Mcarthur Rossetti, MD;  Location: [facility];  Service: Orthopedics;  Laterality: Right;  Right Total Hip Arthroplasty, Anterior Approach (C-Arm)   TOTAL SHOULDER ARTHROPLASTY  2010   Dr. Mardelle Matte; right    Social History:  reports that he quit smoking about 49 years ago. His smoking use included cigarettes. He has a 16.50 pack-year smoking history. He quit smokeless tobacco use about 48 years ago.  His smokeless tobacco use included chew. He reports that he does not drink alcohol and does not use drugs.  Allergies: Allergies  Allergen Reactions   Aspirin Rash and Other (See Comments)    Dyspnea, eyelid edema, truncal urticaria--Patient tolerates other NSAIDS at home Because of a history of documented adverse serious drug reaction;Medi Alert bracelet  is recommended Other reaction(s): Other (See Comments) Dyspnea, eyelid edema, truncal urticaria--Patient tolerates other NSAIDS at home Because of a history of documented adverse serious drug reaction;Medi Alert bracelet  is recommended  Wife states pt is highly allergic     Family History:  Family History  Problem Relation Age of Onset   Hypertension Mother    Skin cancer Mother    Sudden death Father  murdered   Skin cancer Brother    Hypertension Brother    Coronary artery disease Brother        CABG   Breast cancer Paternal Aunt    Colon cancer Paternal Aunt    Colon cancer Paternal Uncle    Heart attack Paternal Uncle    Breast cancer Paternal Grandmother    Kidney disease Neg Hx    Diabetes Neg Hx    Alzheimer's disease Neg Hx    Dementia Neg Hx    Sleep apnea Neg Hx      Current Outpatient Medications:    Calcium Carbonate-Vitamin D (CALTRATE 600+D PO), Take 1 tablet by mouth 2 (two) times daily. , Disp: , Rfl:    donepezil (ARICEPT) 5 MG tablet, Take 1 tablet (5 mg total) by mouth daily., Disp: 90  tablet, Rfl: 3   escitalopram (LEXAPRO) 5 MG tablet, Take 1 tablet (5 mg total) by mouth daily., Disp: 90 tablet, Rfl: 1   LORazepam (ATIVAN) 1 MG tablet, Take 1 tablet (1 mg total) by mouth at bedtime as needed for anxiety., Disp: 15 tablet, Rfl: 0   montelukast (SINGULAIR) 10 MG tablet, Take 1 tablet (10 mg total) by mouth at bedtime., Disp: 90 tablet, Rfl: 0   pantoprazole (PROTONIX) 40 MG tablet, TAKE 1 TABLET EVERY DAY, Disp: 90 tablet, Rfl: 0   polyethylene glycol (MIRALAX / GLYCOLAX) packet, Take 17 g by mouth 2 (two) times daily., Disp: , Rfl:    pravastatin (PRAVACHOL) 20 MG tablet, TAKE 1 TABLET AT BEDTIME, Disp: 90 tablet, Rfl: 0   tamsulosin (FLOMAX) 0.4 MG CAPS capsule, Take 1 capsule (0.4 mg total) by mouth at bedtime., Disp: 90 capsule, Rfl: 1  Review of Systems:  Negative unless indicated in HPI.   Physical Exam: Vitals:   10/02/22 1308 10/02/22 1313  BP: (!) 140/90 (!) 148/84  Pulse: 65   Temp: (!) 97.5 F (36.4 C)   TempSrc: Oral   Weight: 178 lb 6.4 oz (80.9 kg)     Body mass index is 25.6 kg/m.   Physical Exam Vitals reviewed.  Constitutional:      Appearance: Normal appearance.  HENT:     Head: Normocephalic and atraumatic.  Eyes:     Conjunctiva/sclera: Conjunctivae normal.  Cardiovascular:     Rate and Rhythm: Normal rate and regular rhythm.  Pulmonary:     Effort: Pulmonary effort is normal.     Breath sounds: Normal breath sounds.  Skin:    General: Skin is warm and dry.  Neurological:     General: No focal deficit present.     Mental Status: He is alert and oriented to person, place, and time.  Psychiatric:        Mood and Affect: Mood normal.        Behavior: Behavior normal.      Impression and Plan:  HYPERLIPIDEMIA  Essential hypertension  Benign prostatic hyperplasia with nocturia  Stage 3b chronic kidney disease (HCC)  Alzheimer disease ([place])  -Blood pressure remains above goal, however given age and advanced Alzheimer's  dementia I believe 140/80 is acceptable.  I would be more concerned about increased fall  risk with low blood pressure. -He has reversed his sleep-wake cycle, this is very difficult on his wife.  He continues to follow with neurology. -He continues to have significant nocturia and frequency related to his BPH, he continues tamsulosin. -Check kidney function next visit.  Time spent:32 minutes reviewing chart, interviewing and examining patient and  formulating plan of care.     Lelon Frohlich, MD [facility]

## 2022-10-10 ENCOUNTER — Telehealth: Payer: Self-pay | Admitting: Internal Medicine

## 2022-10-10 NOTE — Telephone Encounter (Signed)
Patient dropped off document Handicap Placard, to be filled out by provider. Patient requested to Call Patient to pick up Joshua Lopez) within 5-days. Document is located in providers tray at front office.Please advise at Mobile [phone]  Please advise

## 2022-10-11 NOTE — Telephone Encounter (Signed)
Placed in Dr Hernandez's folder 

## 2022-10-11 NOTE — Telephone Encounter (Signed)
Form is ready to be picked up and wife is aware

## 2022-10-11 NOTE — Progress Notes (Unsigned)
PATIENT: Joshua Lopez DOB: Dec 11, 1929  REASON FOR VISIT: Follow-up for memory HISTORY FROM: Patient, wife PRIMARY NEUROLOGIST: Dr. Frances Furbish   HISTORY OF PRESENT ILLNESS: Today 10/12/22 Here with his wife. Memory is getting worse. Wife is having trouble with him getting to sleep at night. Goes to bed at 8:30 PM, is up at midnight, pacing the halls, fidgeting with his wallet. This lasts about 2 hours, will wake at 7 AM. He sleeps a lot during the day. Has a good appetite. He goes to day program once a week, he doesn't like it, it's 9-2. He takes Aricept 5 mg. He can get agitated, but mostly mood is good. He walks short distance from house to mailbox, hasn't fallen lately. He needs helps with his ADLs. His memory is progressing.   Update 04/06/22 SS: [name] is here today for follow-up for memory. He tried CPAP but couldn't tolerate the machine, they returned it. He doesn't hear well, he didn't want hearing aids. His wife helps with showering, dressing. She manages his medications. He sleeps a lot in the chair. He doesn't drive. He remains on Aricept. He has had a few falls, stumbles. Hasn't hit his head. Has to ask family to help him get up. Currently being treated for UTI. Good appetite with small portions. He has dreams at night, talks in his sleep. His wife is checking into adult day program about a few days. He is declining. He had a hard time with MMSE 11/30.  Update 09/29/21 SS: [name] here today for initial CPAP visit. Split night sleep study 06/24/2021 showed severe OSA. He never got the CPAP. He didn't want to try it, he doesn't think he can wear it, but he never tried it. Memory declines overtime. Is agreeable most times. Tells me about pipe that burst 2 months ago, flooded some area of the basement. On Aricept, can only tolerate 5 mg. Open to CPAP, they just didn't understand the process.   HISTORY 06/08/2021 Dr. Frances Furbish: [name] is an 89 year old gentleman with an underlying medical  history of memory loss, hypertension, hyperlipidemia, reflux disease, BPH, arthritis, subclavian steal syndrome and overweight state, who presents for evaluation of his sleepiness.  He was referred by Dr. Anne Hahn.  The patient is accompanied by his wife today.  He last saw Dr. Anne Hahn in September 2022.  He has been on Aricept.  He sleeps extended hours and is sleepy during the day.  His wife provides most of his history.  He snores mildly, he has sleep disruption due to nocturia, no trouble falling asleep or maintaining sleep otherwise, he goes to the bathroom about 3 times on an average night.  He is to bed around 830 but has already dozed off in the chair for then.  His rise time is around 7:30 AM.  He drinks decaf coffee, typically 1 serving of regular soda with caffeine during the day.  He does not drink any alcohol and quit smoking in 1975.  He lives with his wife, they have 1 daughter who is 7, another daughter passed away at age 34.  They have 5 grandchildren and 7 great-grandchildren.  He had a fall about 2 to 3 months ago.  He has significant daytime somnolence and often dozes off when sedentary.  He does not drive.  He denies recurrent morning headaches.  His Epworth sleepiness score is 22/24, fatigue severity score is 40 out of 63.   REVIEW OF SYSTEMS: Out of a complete 14 system review  of symptoms, the patient complains only of the following symptoms, and all other reviewed systems are negative.  See HPI  ALLERGIES: Allergies  Allergen Reactions   Aspirin Other (See Comments) and Rash    Dyspnea, eyelid edema, truncal urticaria--Patient tolerates other NSAIDS at home  Because of a history of documented adverse serious drug reaction;Medi Alert bracelet  is recommended  Other reaction(s): Other (See Comments)  Because of a history of documented adverse serious drug reaction;Medi Alert bracelet  is recommended  Wife states pt is highly allergic  Dyspnea, eyelid edema, truncal  urticaria--Patient tolerates other NSAIDS at home, Because of a history of documented adverse serious drug reaction;Medi Alert bracelet  is recommended    HOME MEDICATIONS: Outpatient Medications Prior to Visit  Medication Sig Dispense Refill   Calcium Carbonate-Vitamin D (CALTRATE 600+D PO) Take 1 tablet by mouth 2 (two) times daily.      donepezil (ARICEPT) 5 MG tablet Take 1 tablet (5 mg total) by mouth daily. 90 tablet 3   escitalopram (LEXAPRO) 5 MG tablet Take 1 tablet (5 mg total) by mouth daily. 90 tablet 1   LORazepam (ATIVAN) 1 MG tablet Take 1 tablet (1 mg total) by mouth at bedtime as needed for anxiety. 15 tablet 0   montelukast (SINGULAIR) 10 MG tablet Take 1 tablet (10 mg total) by mouth at bedtime. 90 tablet 0   pantoprazole (PROTONIX) 40 MG tablet TAKE 1 TABLET EVERY DAY 90 tablet 0   polyethylene glycol (MIRALAX / GLYCOLAX) packet Take 17 g by mouth 2 (two) times daily.     pravastatin (PRAVACHOL) 20 MG tablet TAKE 1 TABLET AT BEDTIME 90 tablet 0   tamsulosin (FLOMAX) 0.4 MG CAPS capsule Take 1 capsule (0.4 mg total) by mouth at bedtime. 90 capsule 1   No facility-administered medications prior to visit.    PAST MEDICAL HISTORY: Past Medical History:  Diagnosis Date   Alzheimer disease (HCC) 08/26/2020   Arthritis    diffuse   BPH (benign prostatic hypertrophy)    Dr. Isabel Caprice monitors  elevated PSA   Chronic lower back pain 05-10-12   States pain is related to hip   Colon polyp    2006 and 2010   Esophageal stricture    GERD (gastroesophageal reflux disease)    Hyperlipidemia    NMR  2007   Hypertension    Nasal polyps    Subclavian steal syndrome 1997    PAST SURGICAL HISTORY: Past Surgical History:  Procedure Laterality Date   BACK SURGERY  05-10-12   lumbar fusion with hardware   CATARACT EXTRACTION W/ INTRAOCULAR LENS  IMPLANT, BILATERAL     COLONOSCOPY W/ POLYPECTOMY  2006 & 2010   ESOPHAGEAL DILATION  2003   GANGLION CYST EXCISION  1999   LUE    GANGLION CYST EXCISION  05/14/2012   Procedure: REMOVAL GANGLION OF WRIST;  Surgeon: Kathryne Hitch, MD;  Location: [facility];  Service: Orthopedics;  Laterality: Right;  Excision Right Wrist Ganglion Cyst   JOINT REPLACEMENT     KNEE ARTHROSCOPY  2001   left   POSTERIOR FUSION LUMBAR SPINE  02/08/12   L2-L4   PROSTATE BIOPSY  2000   Dr. Earlene Plater   SUBCLAVIAN STENT PLACEMENT  1997   right side   TOTAL HIP ARTHROPLASTY  05/14/2012   Procedure: TOTAL HIP ARTHROPLASTY ANTERIOR APPROACH;  Surgeon: Kathryne Hitch, MD;  Location: [facility];  Service: Orthopedics;  Laterality: Right;  Right Total Hip Arthroplasty, Anterior Approach (C-Arm)  TOTAL SHOULDER ARTHROPLASTY  2010   Dr. Dion Saucier; right    FAMILY HISTORY: Family History  Problem Relation Age of Onset   Hypertension Mother    Skin cancer Mother    Sudden death Father        murdered   Skin cancer Brother    Hypertension Brother    Coronary artery disease Brother        CABG   Breast cancer Paternal Aunt    Colon cancer Paternal Aunt    Colon cancer Paternal Uncle    Heart attack Paternal Uncle    Breast cancer Paternal Grandmother    Kidney disease Neg Hx    Diabetes Neg Hx    Alzheimer's disease Neg Hx    Dementia Neg Hx    Sleep apnea Neg Hx     SOCIAL HISTORY: Social History   Socioeconomic History   Marital status: Married    Spouse name: Not on file   Number of children: 2   Years of education: Not on file   Highest education level: 7th grade  Occupational History   Not on file  Tobacco Use   Smoking status: Former    Packs/day: 0.50    Years: 33.00    Additional pack years: 0.00    Total pack years: 16.50    Types: Cigarettes    Quit date: 07/24/1973    Years since quitting: 49.2   Smokeless tobacco: Former    Types: Chew    Quit date: 07/24/1974   Tobacco comments:    smoked 1949- 1975 , up to 1/3 ppd  Substance and Sexual Activity   Alcohol use: No   Drug use: No   Sexual activity: Yes   Other Topics Concern   Not on file  Social History Narrative   Lives at home with wife   Right handed   Caffeine: 1 cup/day of decaf coffee   Social Determinants of Health   Financial Resource Strain: Not on file  Food Insecurity: Not on file  Transportation Needs: Not on file  Physical Activity: Not on file  Stress: Not on file  Social Connections: Not on file  Intimate Partner Violence: Not on file   PHYSICAL EXAM  Vitals:   10/12/22 1343  BP: (!) 157/78  Pulse: (!) 50  Weight: 177 lb (80.3 kg)  Height: 5\' 9"  (1.753 m)     Body mass index is 26.14 kg/m.  Generalized: Well developed, in no acute distress     04/06/2022    3:00 PM 03/25/2021   10:43 AM 08/26/2020   10:31 AM  MMSE - Mini Mental State Exam  Orientation to time 1 3 1   Orientation to Place 0 4 3  Registration 3 3 3   Attention/ Calculation 0 0 1  Recall 1 3 2   Language- name 2 objects 1 2 2   Language- repeat 1 1 0  Language- follow 3 step command 3 0 2  Language- read & follow direction 1 1 0  Write a sentence 0 1 0  Copy design 0 0 0  Total score 11 18 14    Neurological examination  Mentation: Alert, cooperative, hard of hearing, relies on wife for history. Falling asleep during visit.  Trouble following exam commands.  Speech is off topic could not answer specific questions. Thinks I used to work with him at [employer]  Cranial nerve II-XII: Pupils were equal round reactive to light. Extraocular movements were full, visual field were full on confrontational test. Facial  sensation and strength were normal.  Head turning and shoulder shrug  were normal and symmetric. Motor: Good strength all extremities noted Sensory: Sensory testing is intact to soft touch on all 4 extremities. No evidence of extinction is noted.  Coordination: Apraxia with these commands Gait and station: Gait is wide-based, cautious, no assistive device, small strides  DIAGNOSTIC DATA (LABS, IMAGING, TESTING) - I reviewed patient  records, labs, notes, testing and imaging myself where available.  Lab Results  Component Value Date   WBC 5.4 11/04/2020   HGB 13.7 11/04/2020   HCT 39.8 11/04/2020   MCV 92.9 11/04/2020   PLT 160.0 11/04/2020      Component Value Date/Time   NA 140 11/04/2020 0821   K 4.0 11/04/2020 0821   CL 105 11/04/2020 0821   CO2 28 11/04/2020 0821   GLUCOSE 97 11/04/2020 0821   BUN 25 (H) 11/04/2020 0821   CREATININE 1.19 11/04/2020 0821   CALCIUM 8.9 11/04/2020 0821   PROT 6.3 11/04/2020 0821   ALBUMIN 4.0 11/04/2020 0821   AST 13 11/04/2020 0821   ALT 12 11/04/2020 0821   ALKPHOS 76 11/04/2020 0821   BILITOT 1.0 11/04/2020 0821   GFRNONAA 61 (L) 05/15/2012 0445   GFRAA 71 (L) 05/15/2012 0445   Lab Results  Component Value Date   CHOL 137 11/04/2020   HDL 58.00 11/04/2020   LDLCALC 58 11/04/2020   TRIG 103.0 11/04/2020   CHOLHDL 2 11/04/2020   Lab Results  Component Value Date   HGBA1C 6.2 10/11/2018   Lab Results  Component Value Date   VITAMINB12 394 08/26/2020   Lab Results  Component Value Date   TSH 3.76 10/22/2019    ASSESSMENT AND PLAN 89 y.o. year old male   1.  Dementia 2.  Obstructive sleep apnea, severe  -He continues to have a significant decline, we discussed considering memory care units -I am going to reach out to his PCP to see if they have a case manager or social worker who could assist his wife -He is going to [facility] 1 day a week, his wife may look into increasing the days -Try melatonin starting at 5 mg at bedtime to help with sleep cycle, staying asleep -He will remain on Aricept 5 mg daily, has not been able to tolerate higher, unclear how much this is doing for him, but since no side effects we will continue for now -Has absolutely not been able to tolerate CPAP -Follow-up with me in 6 months or sooner if needed  Margie Ege, Edrick Oh, DNP 10/12/2022, 2:01 PM [facility] [address], [address] [place], [place] [address] [phone]

## 2022-10-12 ENCOUNTER — Other Ambulatory Visit: Payer: Self-pay | Admitting: Internal Medicine

## 2022-10-12 ENCOUNTER — Encounter: Payer: Self-pay | Admitting: Neurology

## 2022-10-12 ENCOUNTER — Ambulatory Visit: Payer: Medicare HMO | Admitting: Neurology

## 2022-10-12 VITALS — BP 157/78 | HR 50 | Ht 69.0 in | Wt 177.0 lb

## 2022-10-12 DIAGNOSIS — G4733 Obstructive sleep apnea (adult) (pediatric): Secondary | ICD-10-CM | POA: Diagnosis not present

## 2022-10-12 DIAGNOSIS — R69 Illness, unspecified: Secondary | ICD-10-CM | POA: Diagnosis not present

## 2022-10-12 DIAGNOSIS — G309 Alzheimer's disease, unspecified: Secondary | ICD-10-CM

## 2022-10-12 DIAGNOSIS — F0280 Dementia in other diseases classified elsewhere without behavioral disturbance: Secondary | ICD-10-CM | POA: Diagnosis not present

## 2022-10-12 MED ORDER — DONEPEZIL HCL 5 MG PO TABS: 5.0000 mg | ORAL_TABLET | Freq: Every day | ORAL | 3 refills | Status: DC

## 2022-10-12 NOTE — Patient Instructions (Addendum)
I would encourage you to look into memory care facilities  I will reach out to your primary care doctor to see if any resources of case manager/social worker are available Try melatonin starting with 5 mg at bedtime to help with sleep, increase up to 10 mg if needed

## 2022-12-07 IMAGING — DX DG CHEST 2V
2 series · 2 of 2 positions shown · non-contrast
Comparison: 10/01/2018

CLINICAL DATA: Productive cough 3-4 days

EXAM:
CHEST - 2 VIEW

[chest pa]
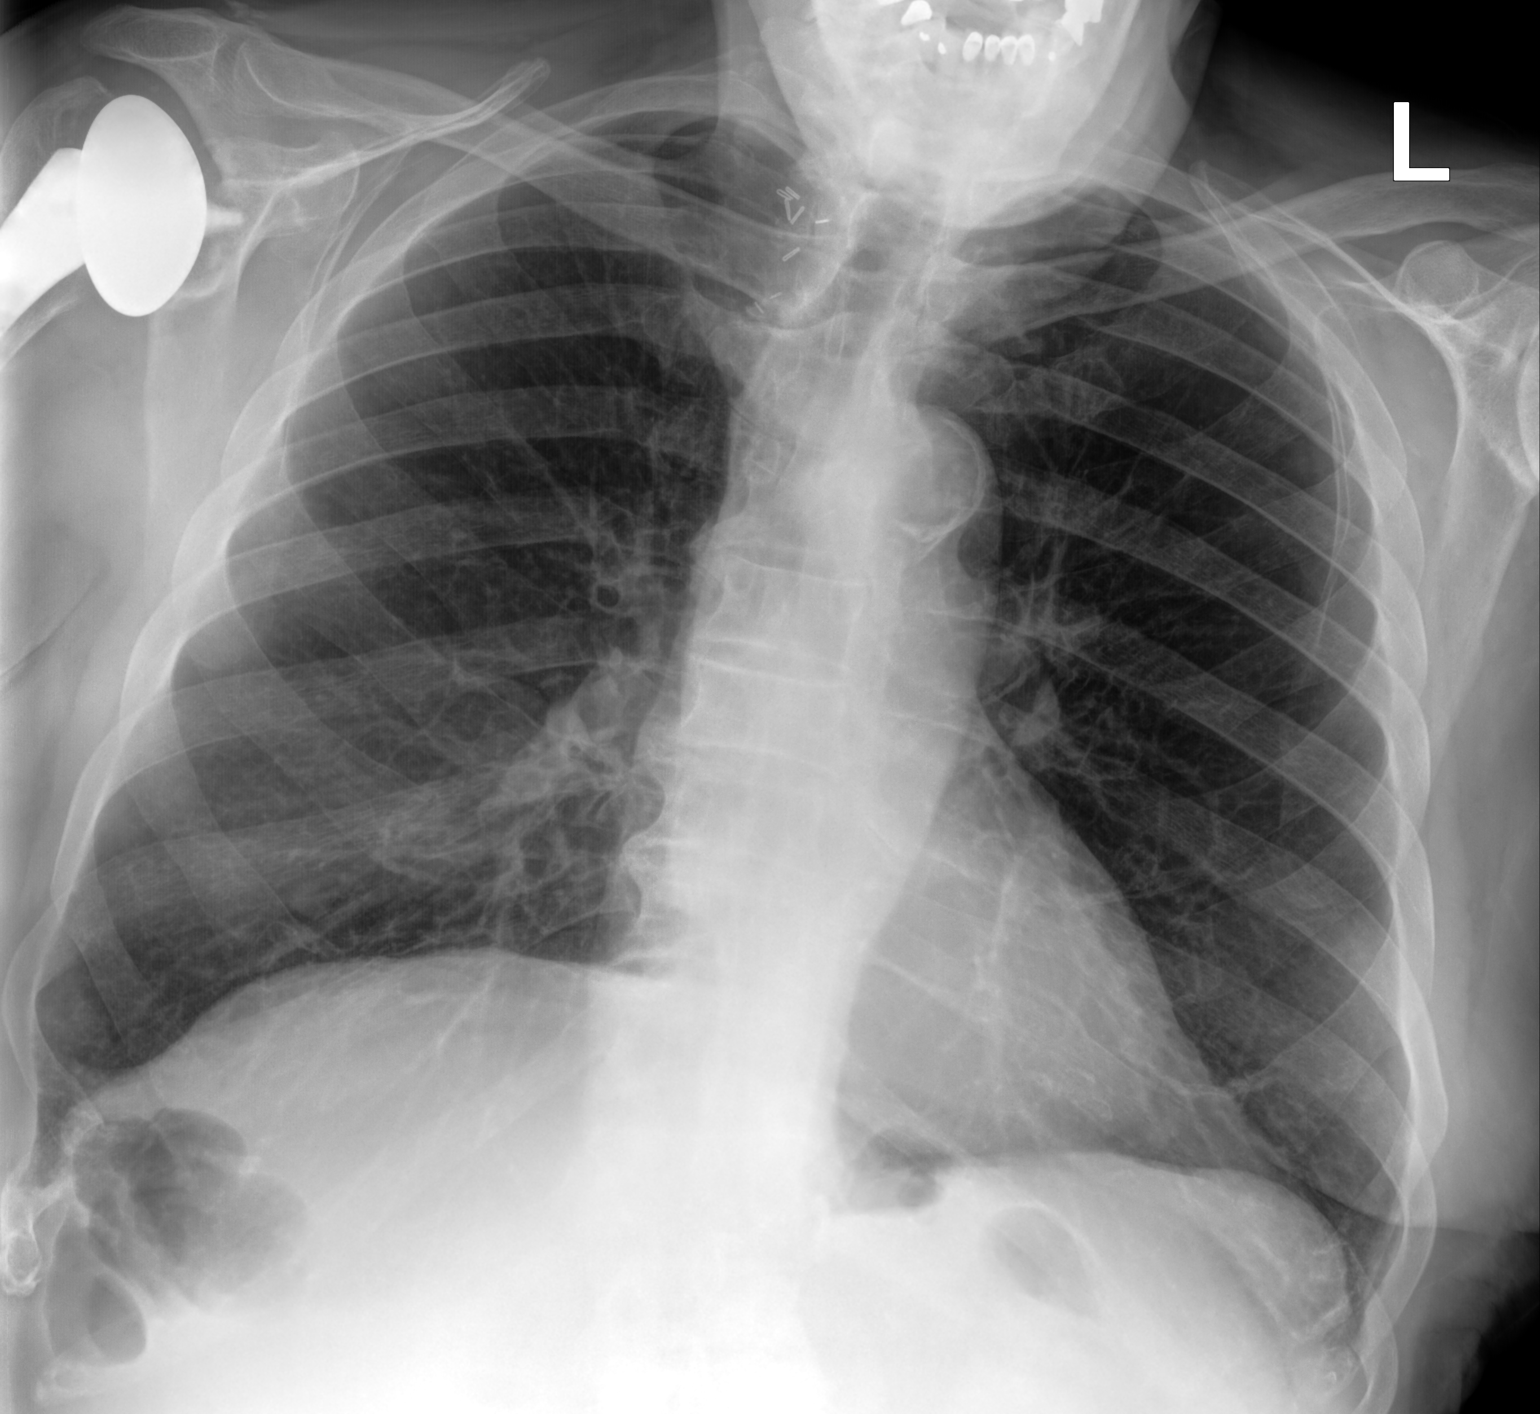

[chest lat]
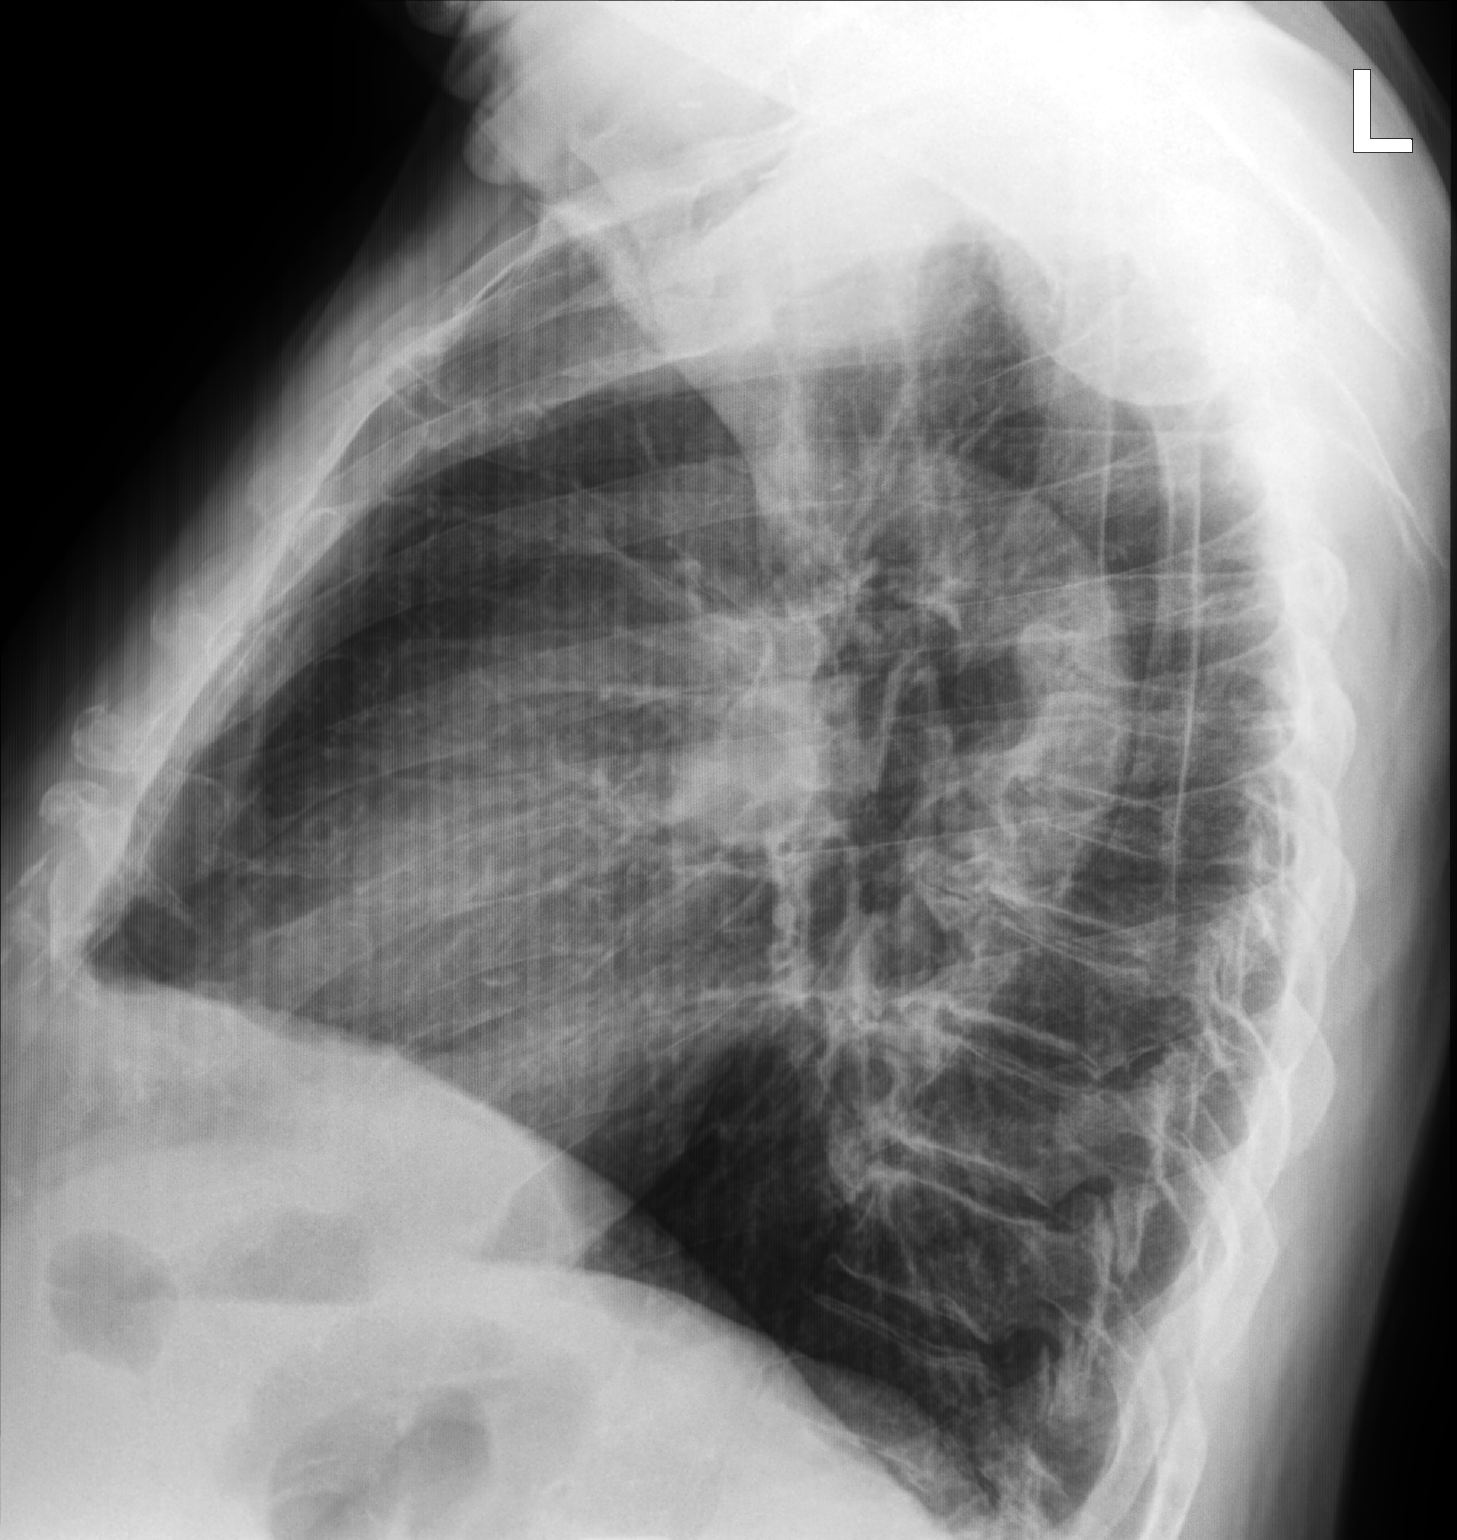

[2 of 2 positions shown; findings below may reference images not displayed]

FINDINGS: Heart size and vascularity normal. Atherosclerotic calcification
aortic arch. Lungs clear without infiltrate effusion or mass. Right
shoulder replacement. Surgical clips right thyroid region.
IMPRESSION: No active cardiopulmonary disease.

## 2023-01-22 ENCOUNTER — Other Ambulatory Visit: Payer: Self-pay | Admitting: Internal Medicine

## 2023-01-31 ENCOUNTER — Telehealth: Payer: Self-pay | Admitting: Internal Medicine

## 2023-01-31 NOTE — Telephone Encounter (Signed)
Spoke to [place] and informed her that Dr Ardyth Harps will complete the form once Joshua Lopez has been accepted.

## 2023-01-31 NOTE — Telephone Encounter (Signed)
Pt's daughter, Aundria Rud called in and requested an FL2 form for the following facilities: [facility], [employer] and [address]. She is requesting to pick up these forms once they are complete, call back number: [phone].

## 2023-02-13 DIAGNOSIS — H524 Presbyopia: Secondary | ICD-10-CM | POA: Diagnosis not present

## 2023-03-07 ENCOUNTER — Telehealth: Payer: Self-pay | Admitting: Internal Medicine

## 2023-03-07 NOTE — Telephone Encounter (Signed)
Patient dropped off document  [facility] Medical Examination Report , to be filled out by provider. Patient requested to send it back via Call Patient to pick up within 7-days, pt's wife was informed that Dr. Ardyth Harps is out of the office this week. Document is located in providers tray at front office.Please advise at Mobile [phone] (mobile)

## 2023-03-08 NOTE — Telephone Encounter (Signed)
Placed on Dr Hernandez's desk 

## 2023-03-13 DIAGNOSIS — Z0279 Encounter for issue of other medical certificate: Secondary | ICD-10-CM

## 2023-03-15 NOTE — Telephone Encounter (Signed)
Form is ready for pick up and wife is aware.

## 2023-03-27 ENCOUNTER — Telehealth: Payer: Self-pay | Admitting: Internal Medicine

## 2023-03-27 NOTE — Telephone Encounter (Signed)
Patient dropped off document FL2, to be filled out by provider. Patient requested to send it back via Fax within 5-days. Document is located in providers tray at front office.Please advise at Mobile [phone] (mobile)

## 2023-03-29 DIAGNOSIS — Z0279 Encounter for issue of other medical certificate: Secondary | ICD-10-CM

## 2023-03-29 NOTE — Telephone Encounter (Signed)
Left message on machine for patient to return our call.  Form is ready for pick up. We need to know where he is going to fill out #11.

## 2023-03-29 NOTE — Telephone Encounter (Signed)
Placed on Dr Hernandez's desk 

## 2023-04-02 NOTE — Telephone Encounter (Signed)
Daughter will be here this afternoon to pick up the forms.

## 2023-04-02 NOTE — Telephone Encounter (Deleted)
Joshua Lopez picked up form--POA

## 2023-04-03 ENCOUNTER — Telehealth: Payer: Self-pay | Admitting: Internal Medicine

## 2023-04-03 NOTE — Telephone Encounter (Signed)
Patient dropped off document  [facility] FL2 Form , to be filled out by provider. Patient requested to send it back via Call Patient to pick up within ASAP. Document is located in providers tray at front office.Please advise at [facility] [phone]

## 2023-04-03 NOTE — Telephone Encounter (Signed)
Form is complete and ready to be picked up.  Wife is aware.

## 2023-04-16 NOTE — Telephone Encounter (Signed)
Joshua Lopez called back stating she only needs the updated medication list

## 2023-04-16 NOTE — Telephone Encounter (Signed)
Joshua Lopez with [facility] called to request an updated medicine list, signed by MD and updated FL2.  Please fax to: [phone]

## 2023-04-17 NOTE — Telephone Encounter (Signed)
Signed Medication list faxed and confirmed.

## 2023-04-23 DIAGNOSIS — M48061 Spinal stenosis, lumbar region without neurogenic claudication: Secondary | ICD-10-CM | POA: Diagnosis not present

## 2023-04-23 DIAGNOSIS — G309 Alzheimer's disease, unspecified: Secondary | ICD-10-CM | POA: Diagnosis not present

## 2023-04-23 DIAGNOSIS — N400 Enlarged prostate without lower urinary tract symptoms: Secondary | ICD-10-CM | POA: Diagnosis not present

## 2023-04-23 DIAGNOSIS — E785 Hyperlipidemia, unspecified: Secondary | ICD-10-CM | POA: Diagnosis not present

## 2023-04-23 DIAGNOSIS — K2100 Gastro-esophageal reflux disease with esophagitis, without bleeding: Secondary | ICD-10-CM | POA: Diagnosis not present

## 2023-04-23 DIAGNOSIS — N183 Chronic kidney disease, stage 3 unspecified: Secondary | ICD-10-CM | POA: Diagnosis not present

## 2023-04-23 DIAGNOSIS — G4733 Obstructive sleep apnea (adult) (pediatric): Secondary | ICD-10-CM | POA: Diagnosis not present

## 2023-04-23 DIAGNOSIS — I272 Pulmonary hypertension, unspecified: Secondary | ICD-10-CM | POA: Diagnosis not present

## 2023-04-26 ENCOUNTER — Encounter: Payer: Medicare HMO | Admitting: Family Medicine

## 2023-04-26 DIAGNOSIS — G301 Alzheimer's disease with late onset: Secondary | ICD-10-CM | POA: Diagnosis not present

## 2023-04-26 DIAGNOSIS — I10 Essential (primary) hypertension: Secondary | ICD-10-CM | POA: Diagnosis not present

## 2023-04-26 DIAGNOSIS — Z79899 Other long term (current) drug therapy: Secondary | ICD-10-CM | POA: Diagnosis not present

## 2023-04-30 DIAGNOSIS — M79604 Pain in right leg: Secondary | ICD-10-CM | POA: Diagnosis not present

## 2023-04-30 DIAGNOSIS — M25551 Pain in right hip: Secondary | ICD-10-CM | POA: Diagnosis not present

## 2023-04-30 DIAGNOSIS — G309 Alzheimer's disease, unspecified: Secondary | ICD-10-CM | POA: Diagnosis not present

## 2023-04-30 DIAGNOSIS — F02C11 Dementia in other diseases classified elsewhere, severe, with agitation: Secondary | ICD-10-CM | POA: Diagnosis not present

## 2023-04-30 DIAGNOSIS — R296 Repeated falls: Secondary | ICD-10-CM | POA: Diagnosis not present

## 2023-04-30 DIAGNOSIS — I10 Essential (primary) hypertension: Secondary | ICD-10-CM | POA: Diagnosis not present

## 2023-05-02 ENCOUNTER — Other Ambulatory Visit: Payer: Self-pay

## 2023-05-02 ENCOUNTER — Emergency Department (HOSPITAL_COMMUNITY): Payer: Medicare HMO

## 2023-05-02 ENCOUNTER — Emergency Department (HOSPITAL_COMMUNITY)
Admission: EM | Admit: 2023-05-02 | Discharge: 2023-05-02 | Disposition: A | Discharge status: Skilled Nursing Facility | Payer: Medicare HMO | Attending: Student | Admitting: Student

## 2023-05-02 DIAGNOSIS — Z23 Encounter for immunization: Secondary | ICD-10-CM | POA: Insufficient documentation

## 2023-05-02 DIAGNOSIS — I129 Hypertensive chronic kidney disease with stage 1 through stage 4 chronic kidney disease, or unspecified chronic kidney disease: Secondary | ICD-10-CM | POA: Diagnosis not present

## 2023-05-02 DIAGNOSIS — M1610 Unilateral primary osteoarthritis, unspecified hip: Secondary | ICD-10-CM | POA: Diagnosis not present

## 2023-05-02 DIAGNOSIS — Z96641 Presence of right artificial hip joint: Secondary | ICD-10-CM | POA: Diagnosis not present

## 2023-05-02 DIAGNOSIS — W19XXXA Unspecified fall, initial encounter: Secondary | ICD-10-CM | POA: Diagnosis not present

## 2023-05-02 DIAGNOSIS — Z7401 Bed confinement status: Secondary | ICD-10-CM | POA: Diagnosis not present

## 2023-05-02 DIAGNOSIS — R58 Hemorrhage, not elsewhere classified: Secondary | ICD-10-CM | POA: Diagnosis not present

## 2023-05-02 DIAGNOSIS — R9431 Abnormal electrocardiogram [ECG] [EKG]: Secondary | ICD-10-CM | POA: Diagnosis not present

## 2023-05-02 DIAGNOSIS — N189 Chronic kidney disease, unspecified: Secondary | ICD-10-CM | POA: Diagnosis not present

## 2023-05-02 DIAGNOSIS — S0990XA Unspecified injury of head, initial encounter: Secondary | ICD-10-CM | POA: Diagnosis not present

## 2023-05-02 DIAGNOSIS — M1612 Unilateral primary osteoarthritis, left hip: Secondary | ICD-10-CM | POA: Diagnosis not present

## 2023-05-02 DIAGNOSIS — R2681 Unsteadiness on feet: Secondary | ICD-10-CM | POA: Diagnosis not present

## 2023-05-02 DIAGNOSIS — S0101XA Laceration without foreign body of scalp, initial encounter: Secondary | ICD-10-CM | POA: Insufficient documentation

## 2023-05-02 DIAGNOSIS — S199XXA Unspecified injury of neck, initial encounter: Secondary | ICD-10-CM | POA: Diagnosis not present

## 2023-05-02 DIAGNOSIS — M169 Osteoarthritis of hip, unspecified: Secondary | ICD-10-CM | POA: Diagnosis not present

## 2023-05-02 DIAGNOSIS — I10 Essential (primary) hypertension: Secondary | ICD-10-CM | POA: Diagnosis not present

## 2023-05-02 DIAGNOSIS — R456 Violent behavior: Secondary | ICD-10-CM | POA: Diagnosis not present

## 2023-05-02 DIAGNOSIS — Z743 Need for continuous supervision: Secondary | ICD-10-CM | POA: Diagnosis not present

## 2023-05-02 LAB — COMPREHENSIVE METABOLIC PANEL
ALT: 27 U/L (ref 0–44)
AST: 45 U/L — ABNORMAL HIGH (ref 15–41)
Albumin: 3.4 g/dL — ABNORMAL LOW (ref 3.5–5.0)
Alkaline Phosphatase: 61 U/L (ref 38–126)
Anion gap: 7 (ref 5–15)
BUN: 21 mg/dL (ref 8–23)
CO2: 26 mmol/L (ref 22–32)
Calcium: 8.6 mg/dL — ABNORMAL LOW (ref 8.9–10.3)
Chloride: 105 mmol/L (ref 98–111)
Creatinine, Ser: 1.1 mg/dL (ref 0.61–1.24)
GFR, Estimated: >60 mL/min (ref >60)
Glucose, Bld: 116 mg/dL — ABNORMAL HIGH (ref 70–99)
Potassium: 3.8 mmol/L (ref 3.5–5.1)
Sodium: 138 mmol/L (ref 135–145)
Total Bilirubin: 1 mg/dL (ref 0.3–1.2)
Total Protein: 5.9 g/dL — ABNORMAL LOW (ref 6.5–8.1)

## 2023-05-02 LAB — TROPONIN I (HIGH SENSITIVITY)
Troponin I (High Sensitivity): 14 ng/L (ref <18)
Troponin I (High Sensitivity): 11 ng/L (ref <18)

## 2023-05-02 LAB — URINALYSIS, W/ REFLEX TO CULTURE (INFECTION SUSPECTED)
Bacteria, UA: NONE SEEN
Bilirubin Urine: NEGATIVE
Glucose, UA: NEGATIVE mg/dL
Hgb urine dipstick: NEGATIVE
Ketones, ur: 5 mg/dL — AB
Leukocytes,Ua: NEGATIVE
Nitrite: NEGATIVE
Protein, ur: NEGATIVE mg/dL
Specific Gravity, Urine: 1.017 (ref 1.005–1.030)
pH: 6 (ref 5.0–8.0)

## 2023-05-02 LAB — CBC WITH DIFFERENTIAL/PLATELET
Abs Immature Granulocytes: 0.01 10*3/uL (ref 0.00–0.07)
Basophils Absolute: 0 10*3/uL (ref 0.0–0.1)
Basophils Relative: 1 %
Eosinophils Absolute: 0.1 10*3/uL (ref 0.0–0.5)
Eosinophils Relative: 2 %
HCT: 37.9 % — ABNORMAL LOW (ref 39.0–52.0)
Hemoglobin: 12.7 g/dL — ABNORMAL LOW (ref 13.0–17.0)
Immature Granulocytes: 0 %
Lymphocytes Relative: 14 %
Lymphs Abs: 0.8 10*3/uL (ref 0.7–4.0)
MCH: 31.1 pg (ref 26.0–34.0)
MCHC: 33.5 g/dL (ref 30.0–36.0)
MCV: 92.9 fL (ref 80.0–100.0)
Monocytes Absolute: 0.3 10*3/uL (ref 0.1–1.0)
Monocytes Relative: 6 %
Neutro Abs: 4.4 10*3/uL (ref 1.7–7.7)
Neutrophils Relative %: 77 %
Platelets: 158 10*3/uL (ref 150–400)
RBC: 4.08 MIL/uL — ABNORMAL LOW (ref 4.22–5.81)
RDW: 13.1 % (ref 11.5–15.5)
WBC: 5.8 10*3/uL (ref 4.0–10.5)
nRBC: 0 % (ref 0.0–0.2)

## 2023-05-02 MED ORDER — TETANUS-DIPHTH-ACELL PERTUSSIS 5-2.5-18.5 LF-MCG/0.5 IM SUSY
0.5000 mL | PREFILLED_SYRINGE | Freq: Once | INTRAMUSCULAR | Status: AC
  Administered 2023-05-02: 0.5 mL via INTRAMUSCULAR
  Filled 2023-05-02: qty 0.5

## 2023-05-02 NOTE — ED Triage Notes (Addendum)
Patient BIB EMS coming from [facility]. Unwitnessed fall with hitting head on table 2in laceration. No blood thinners. Skin tear under right eye 168/86, 72, 14, CBG 150. Unsteady gait, W/C. Baseline confusion and non-verbal. C-collar. Unknown LOC.

## 2023-05-02 NOTE — ED Notes (Signed)
Pt wound cleaned and irrigated with hydrogen peroxide. Stapler at bedside.

## 2023-05-02 NOTE — ED Notes (Signed)
Pt in NAD, disoriented and irritable at time of departure from ED. Pt at baseline. Pt fighting staff when trying to obtain VS.

## 2023-05-02 NOTE — ED Notes (Signed)
[facility] called  

## 2023-05-02 NOTE — ED Provider Notes (Signed)
[facility]MENT AT [facility] Provider Note   CSN: [id number] Arrival date & time: 05/02/23  1005     History  Chief Complaint  Patient presents with   Marletta Lor    Joshua Lopez is a 89 y.o. male history of Alzheimer's, CKD, GAD, hyperlipidemia, hypertension presented after a fall occurred this morning.  This fall was unwitnessed.  Patient is unable to give history due to his Alzheimer's and so history is obtained through EMS and [facility] where he is from.  Both state that patient had a fall this morning however was only on the ground for a few seconds as this fall was heard but unwitnessed.  They state that patient normally uses a wheelchair in the morning is in the morning he is normally unsteady however they note the wheelchair was not near the patient so they suspect he was walking around without the wheelchair which is what led to the fall.  They note a laceration to the back of his head however patient is unable to say he is having head pain, vision changes, neck pain, belly pain, back pain, changes sensation/motor skills.  Patient is not on any blood thinners.  Patient is acting at his baseline.  Nursing home denies any new medications or medication changes.  Home Medications Prior to Admission medications   Medication Sig Start Date End Date Taking? Authorizing Provider  donepezil (ARICEPT) 5 MG tablet Take 1 tablet (5 mg total) by mouth daily. 10/12/22  Yes Glean Salvo, NP  escitalopram (LEXAPRO) 5 MG tablet Take 1 tablet by mouth once daily 01/24/23  Yes Philip Aspen, Limmie Patricia, MD  LORazepam (ATIVAN) 1 MG tablet Take 1 tablet (1 mg total) by mouth at bedtime as needed for anxiety. 06/06/20  Yes Jacalyn Lefevre, MD  montelukast (SINGULAIR) 10 MG tablet Take 1 tablet (10 mg total) by mouth at bedtime. 06/19/22  Yes Philip Aspen, Limmie Patricia, MD  pantoprazole (PROTONIX) 40 MG tablet TAKE 1 TABLET EVERY DAY 10/12/21  Yes Philip Aspen, Limmie Patricia, MD   polyethylene glycol [facility] / GLYCOLAX) packet Take 17 g by mouth 2 (two) times daily.   Yes [provider]  pravastatin (PRAVACHOL) 20 MG tablet TAKE 1 TABLET AT BEDTIME 10/12/21  Yes Philip Aspen, Limmie Patricia, MD  tamsulosin (FLOMAX) 0.4 MG CAPS capsule Take 1 capsule (0.4 mg total) by mouth at bedtime. 12/09/18  Yes Henderson Cloud, MD      Allergies    Aspirin    Review of Systems   Review of Systems  Physical Exam Updated Vital Signs BP (!) 120/98 (BP Location: Left Arm)   Pulse 68   Temp 98.5 F (36.9 C) (Oral)   Resp 18   SpO2 100%  Physical Exam Constitutional:      General: He is not in acute distress.    Appearance: He is not ill-appearing, toxic-appearing or diaphoretic.     Comments: Demented but at baseline  Eyes:     Extraocular Movements: Extraocular movements intact.     Conjunctiva/sclera: Conjunctivae normal.     Pupils: Pupils are equal, round, and reactive to light.  Neck:     Comments: Initially in c-collar however attending remove the c-collar and after c-collar was removed I did not appreciate any cervical abnormalities Patient was unable to say whether or not he was having any midline tenderness Cardiovascular:     Rate and Rhythm: Normal rate and regular rhythm.     Pulses:  Normal pulses.     Heart sounds: Normal heart sounds.  Pulmonary:     Effort: Pulmonary effort is normal. No respiratory distress.     Breath sounds: Normal breath sounds.  Musculoskeletal:        General: Normal range of motion.     Cervical back: Normal range of motion.  Skin:    General: Skin is warm and dry.     Capillary Refill: Capillary refill takes less than 2 seconds.     Comments: Some ecchymosis noted on left hip however no open wounds noted and pelvis is stable Able to range left lower extremity without patient wincing in pain  Neurological:     Comments: Tremors noted however this is patient's baseline due to Alzheimer's according to  nursing home     ED Results / Procedures / Treatments   Labs (all labs ordered are listed, but only abnormal results are displayed) Labs Reviewed  COMPREHENSIVE METABOLIC PANEL - Abnormal; Notable for the following components:      Result Value   Glucose, Bld 116 (*)    Calcium 8.6 (*)    Total Protein 5.9 (*)    Albumin 3.4 (*)    AST 45 (*)    All other components within normal limits  CBC WITH DIFFERENTIAL/PLATELET - Abnormal; Notable for the following components:   RBC 4.08 (*)    Hemoglobin 12.7 (*)    HCT 37.9 (*)    All other components within normal limits  URINALYSIS, W/ REFLEX TO CULTURE (INFECTION SUSPECTED) - Abnormal; Notable for the following components:   Ketones, ur 5 (*)    All other components within normal limits  TROPONIN I (HIGH SENSITIVITY)  TROPONIN I (HIGH SENSITIVITY)    EKG None  Radiology DG Hip Unilat W or Wo Pelvis 2-3 Views Left  Result Date: 05/02/2023 CLINICAL DATA:  Fall.  Unsteady gait. EXAM: DG HIP (WITH OR WITHOUT PELVIS) 2-3V LEFT COMPARISON:  04/17/2012. FINDINGS: Examination is limited due to blurred left hip AP radiograph. Pelvis is intact with normal and symmetric sacroiliac joints. No acute fracture or dislocation. No aggressive osseous lesion. Visualized sacral arcuate lines are unremarkable. Unremarkable symphysis pubis. There are mild degenerative changes of the left hip joint characterized by mild joint space narrowing and osteophytosis of the superior acetabulum. Note is made of right hip arthroplasty. There is also partially imaged lower lumbar spinal fixation hardware. No radiopaque foreign bodies. IMPRESSION: 1. Mild left hip osteoarthritis. No acute fracture or dislocation. Electronically Signed   By: Jules Schick M.D.   On: 05/02/2023 13:50   CT Head Wo Contrast  Result Date: 05/02/2023 CLINICAL DATA:  Fall head and neck injury, pain EXAM: CT HEAD WITHOUT CONTRAST CT MAXILLOFACIAL WITHOUT CONTRAST CT CERVICAL SPINE WITHOUT  CONTRAST TECHNIQUE: Multidetector CT imaging of the head, cervical spine, and maxillofacial structures were performed using the standard protocol without intravenous contrast. Multiplanar CT image reconstructions of the cervical spine and maxillofacial structures were also generated. RADIATION DOSE REDUCTION: This exam was performed according to the departmental dose-optimization program which includes automated exposure control, adjustment of the mA and/or kV according to patient size and/or use of iterative reconstruction technique. COMPARISON:  09/10/2020 FINDINGS: CT HEAD FINDINGS Brain: No evidence of acute infarction, hemorrhage, hydrocephalus, extra-axial collection or mass lesion/mass effect. Mild periventricular white matter hypodensity. Vascular: No hyperdense vessel or unexpected calcification. CT FACIAL BONES FINDINGS Skull: Normal. Negative for fracture or focal lesion. Facial bones: No displaced fractures or dislocations. Sinuses/Orbits: Mucosal thickening  of the maxillary sinuses and ethmoid air cells. Other: Small lipoma of the right forehead (series 4, image 58. CT CERVICAL SPINE FINDINGS Alignment: Degenerative straightening of the normal cervical lordosis. Skull base and vertebrae: No acute fracture. No primary bone lesion or focal pathologic process. Soft tissues and spinal canal: No prevertebral fluid or swelling. No visible canal hematoma. Disc levels: Moderate to severe multilevel disc degenerative disease throughout the cervical spine. Upper chest: Negative. Other: None. IMPRESSION: 1. No acute intracranial pathology. 2. No displaced fractures or dislocations of the facial bones. 3. No fracture or static subluxation of the cervical spine. 4. Moderate to severe multilevel disc degenerative disease throughout the cervical spine. Electronically Signed   By: Jearld Lesch M.D.   On: 05/02/2023 12:15   CT Cervical Spine Wo Contrast  Result Date: 05/02/2023 CLINICAL DATA:  Fall head and neck  injury, pain EXAM: CT HEAD WITHOUT CONTRAST CT MAXILLOFACIAL WITHOUT CONTRAST CT CERVICAL SPINE WITHOUT CONTRAST TECHNIQUE: Multidetector CT imaging of the head, cervical spine, and maxillofacial structures were performed using the standard protocol without intravenous contrast. Multiplanar CT image reconstructions of the cervical spine and maxillofacial structures were also generated. RADIATION DOSE REDUCTION: This exam was performed according to the departmental dose-optimization program which includes automated exposure control, adjustment of the mA and/or kV according to patient size and/or use of iterative reconstruction technique. COMPARISON:  09/10/2020 FINDINGS: CT HEAD FINDINGS Brain: No evidence of acute infarction, hemorrhage, hydrocephalus, extra-axial collection or mass lesion/mass effect. Mild periventricular white matter hypodensity. Vascular: No hyperdense vessel or unexpected calcification. CT FACIAL BONES FINDINGS Skull: Normal. Negative for fracture or focal lesion. Facial bones: No displaced fractures or dislocations. Sinuses/Orbits: Mucosal thickening of the maxillary sinuses and ethmoid air cells. Other: Small lipoma of the right forehead (series 4, image 58. CT CERVICAL SPINE FINDINGS Alignment: Degenerative straightening of the normal cervical lordosis. Skull base and vertebrae: No acute fracture. No primary bone lesion or focal pathologic process. Soft tissues and spinal canal: No prevertebral fluid or swelling. No visible canal hematoma. Disc levels: Moderate to severe multilevel disc degenerative disease throughout the cervical spine. Upper chest: Negative. Other: None. IMPRESSION: 1. No acute intracranial pathology. 2. No displaced fractures or dislocations of the facial bones. 3. No fracture or static subluxation of the cervical spine. 4. Moderate to severe multilevel disc degenerative disease throughout the cervical spine. Electronically Signed   By: Jearld Lesch M.D.   On: 05/02/2023  12:15   CT Maxillofacial WO CM  Result Date: 05/02/2023 CLINICAL DATA:  Fall head and neck injury, pain EXAM: CT HEAD WITHOUT CONTRAST CT MAXILLOFACIAL WITHOUT CONTRAST CT CERVICAL SPINE WITHOUT CONTRAST TECHNIQUE: Multidetector CT imaging of the head, cervical spine, and maxillofacial structures were performed using the standard protocol without intravenous contrast. Multiplanar CT image reconstructions of the cervical spine and maxillofacial structures were also generated. RADIATION DOSE REDUCTION: This exam was performed according to the departmental dose-optimization program which includes automated exposure control, adjustment of the mA and/or kV according to patient size and/or use of iterative reconstruction technique. COMPARISON:  09/10/2020 FINDINGS: CT HEAD FINDINGS Brain: No evidence of acute infarction, hemorrhage, hydrocephalus, extra-axial collection or mass lesion/mass effect. Mild periventricular white matter hypodensity. Vascular: No hyperdense vessel or unexpected calcification. CT FACIAL BONES FINDINGS Skull: Normal. Negative for fracture or focal lesion. Facial bones: No displaced fractures or dislocations. Sinuses/Orbits: Mucosal thickening of the maxillary sinuses and ethmoid air cells. Other: Small lipoma of the right forehead (series 4, image 58. CT CERVICAL SPINE FINDINGS  Alignment: Degenerative straightening of the normal cervical lordosis. Skull base and vertebrae: No acute fracture. No primary bone lesion or focal pathologic process. Soft tissues and spinal canal: No prevertebral fluid or swelling. No visible canal hematoma. Disc levels: Moderate to severe multilevel disc degenerative disease throughout the cervical spine. Upper chest: Negative. Other: None. IMPRESSION: 1. No acute intracranial pathology. 2. No displaced fractures or dislocations of the facial bones. 3. No fracture or static subluxation of the cervical spine. 4. Moderate to severe multilevel disc degenerative disease  throughout the cervical spine. Electronically Signed   By: Jearld Lesch M.D.   On: 05/02/2023 12:15    Procedures .Marland KitchenLaceration Repair  Date/Time: 05/02/2023 11:28 AM  Performed by: Netta Corrigan, PA-C Authorized by: Netta Corrigan, PA-C   Consent:    Consent obtained:  Emergent situation   Consent given by:  Healthcare agent   Risks, benefits, and alternatives were discussed: yes     Risks discussed:  Infection, need for additional repair, nerve damage, pain, poor cosmetic result, poor wound healing, retained foreign body, vascular damage and tendon damage Universal protocol:    Procedure explained and questions answered to patient or proxy's satisfaction: yes     Imaging studies available: yes     Patient identity confirmed:  Arm band Anesthesia:    Anesthesia method:  None Laceration details:    Location:  Scalp   Scalp location:  Crown   Length (cm):  3   Depth (mm):  1 Treatment:    Area cleansed with:  Saline   Amount of cleaning:  Standard   Irrigation solution:  Sterile saline   Irrigation method:  Pressure wash   Visualized foreign bodies/material removed: no     Debridement:  None Skin repair:    Repair method:  Staples   Number of staples:  4 Approximation:    Approximation:  Close Repair type:    Repair type:  Simple     Medications Ordered in ED Medications  Tdap (BOOSTRIX) injection 0.5 mL (0.5 mLs Intramuscular Given 05/02/23 1156)    ED Course/ Medical Decision Making/ A&P                                 Medical Decision Making Amount and/or Complexity of Data Reviewed Labs: ordered. Radiology: ordered.  Risk Prescription drug management.   Gabriel Cirri 89 y.o. presented today for fall. Working DDx that I considered at this time includes, but not limited to, vasovagal episode, mechanical fall, ICH, epidural/subdural hematoma, basilar skull fracture, anemia, electrolyte abnormalities, drug-induced, arrhythmia, UTI, fracture, contusion,  soft tissue injury.  R/o DDx: vasovagal episode, ICH, epidural/subdural hematoma, basilar skull fracture, anemia, electrolyte abnormalities, drug-induced, arrhythmia, UTI, fracture,: These are considered less likely due to history of present illness, physical exam, lab/imaging findings  Review of prior external notes: 04/03/2023 telephone  Unique Tests and My Interpretation:  CT head without contrast: No acute changes CT cervical spine without contrast: No acute changes CT maxillofacial: No acute changes Left hip x-ray: Mild arthritis noted CBC: Unremarkable CMP: Unremarkable UA: Unremarkable Troponin: 14, 11  Discussion with Independent Historian:  EMS, [facility]  Discussion of Management of Tests: None  Risk: Low: based on diagnostic testing/clinical impression and treatment plan  Risk Stratification Score: none  Staffed with Kommor, MD  Plan: On exam patient was in no acute distress with stable vitals.  Patient was at his mental  baseline according to EMS and [facility] with his Alzheimer's and was unable to give a history.  Patient does have laceration noted to the back of his head that was cleaned out and 4 staples were placed.  It sounds like patient had mechanical fall this morning when speaking with girlfriend home however will obtain broad workup to rule out other causes.  Attending evaluated patient removed c-collar.  Physical exam was very limited as patient cannot follow commands due to Alzheimer's.  Will update tetanus.  Imaging and labs are unremarkable.  Patient has remained at his baseline throughout his ER stay for the past 4 hours.  Will discharge back to home.  Laceration care was put into the discharge papers along with when the staples need to be removed.  Red flag signs were discussed in the discharge papers as well.  Patient stable to be discharged at this time.  Patient was given return precautions. Patient stable for discharge at this time.   Patient verbalized understanding of plan.  This chart was dictated using voice recognition software.  Despite best efforts to proofread,  errors can occur which can change the documentation meaning.         Final Clinical Impression(s) / ED Diagnoses Final diagnoses:  Fall, initial encounter  Scalp laceration, initial encounter  Hip arthritis    Rx / DC Orders ED Discharge Orders     None         Remi Deter 05/02/23 1440    Glendora Score, MD 05/02/23 2143

## 2023-05-02 NOTE — ED Notes (Signed)
Pt fighting and not allowing staff to obtain DC VS. PTAR at bedside to transport pt back to facility.

## 2023-05-02 NOTE — Discharge Instructions (Signed)
Please follow-up with your primary care provider in regards to his symptoms and ER visit.  Today your labs and physical dam were reassuring and you have 4 staples placed.  These will need to be taken out in 7 to 10 days by a primary care provider, urgent care, ER.  Please keep the wound clean and dry.  If you begin to see signs of redness, fluctuance, drainage, fevers, extreme pain not controlled by Tylenol please return to ER.  Your tetanus was also updated today.  You have arthritis in your left hip as per the x-ray.

## 2023-05-03 DIAGNOSIS — K219 Gastro-esophageal reflux disease without esophagitis: Secondary | ICD-10-CM | POA: Diagnosis not present

## 2023-05-03 DIAGNOSIS — F02818 Dementia in other diseases classified elsewhere, unspecified severity, with other behavioral disturbance: Secondary | ICD-10-CM | POA: Diagnosis not present

## 2023-05-03 DIAGNOSIS — G4733 Obstructive sleep apnea (adult) (pediatric): Secondary | ICD-10-CM | POA: Diagnosis not present

## 2023-05-03 DIAGNOSIS — N39498 Other specified urinary incontinence: Secondary | ICD-10-CM | POA: Diagnosis not present

## 2023-05-03 DIAGNOSIS — N401 Enlarged prostate with lower urinary tract symptoms: Secondary | ICD-10-CM | POA: Diagnosis not present

## 2023-05-03 DIAGNOSIS — M48061 Spinal stenosis, lumbar region without neurogenic claudication: Secondary | ICD-10-CM | POA: Diagnosis not present

## 2023-05-03 DIAGNOSIS — G309 Alzheimer's disease, unspecified: Secondary | ICD-10-CM | POA: Diagnosis not present

## 2023-05-03 DIAGNOSIS — S098XXD Other specified injuries of head, subsequent encounter: Secondary | ICD-10-CM | POA: Diagnosis not present

## 2023-05-03 DIAGNOSIS — N183 Chronic kidney disease, stage 3 unspecified: Secondary | ICD-10-CM | POA: Diagnosis not present

## 2023-05-03 DIAGNOSIS — E785 Hyperlipidemia, unspecified: Secondary | ICD-10-CM | POA: Diagnosis not present

## 2023-05-03 DIAGNOSIS — G301 Alzheimer's disease with late onset: Secondary | ICD-10-CM | POA: Diagnosis not present

## 2023-05-03 DIAGNOSIS — I272 Pulmonary hypertension, unspecified: Secondary | ICD-10-CM | POA: Diagnosis not present

## 2023-05-03 DIAGNOSIS — Z9181 History of falling: Secondary | ICD-10-CM | POA: Diagnosis not present

## 2023-05-08 DIAGNOSIS — G301 Alzheimer's disease with late onset: Secondary | ICD-10-CM | POA: Diagnosis not present

## 2023-05-08 DIAGNOSIS — F02B18 Dementia in other diseases classified elsewhere, moderate, with other behavioral disturbance: Secondary | ICD-10-CM | POA: Diagnosis not present

## 2023-05-08 DIAGNOSIS — F331 Major depressive disorder, recurrent, moderate: Secondary | ICD-10-CM | POA: Diagnosis not present

## 2023-05-11 DIAGNOSIS — N401 Enlarged prostate with lower urinary tract symptoms: Secondary | ICD-10-CM | POA: Diagnosis not present

## 2023-05-11 DIAGNOSIS — M48061 Spinal stenosis, lumbar region without neurogenic claudication: Secondary | ICD-10-CM | POA: Diagnosis not present

## 2023-05-11 DIAGNOSIS — S098XXD Other specified injuries of head, subsequent encounter: Secondary | ICD-10-CM | POA: Diagnosis not present

## 2023-05-11 DIAGNOSIS — N39498 Other specified urinary incontinence: Secondary | ICD-10-CM | POA: Diagnosis not present

## 2023-05-11 DIAGNOSIS — E785 Hyperlipidemia, unspecified: Secondary | ICD-10-CM | POA: Diagnosis not present

## 2023-05-11 DIAGNOSIS — Z9181 History of falling: Secondary | ICD-10-CM | POA: Diagnosis not present

## 2023-05-11 DIAGNOSIS — K219 Gastro-esophageal reflux disease without esophagitis: Secondary | ICD-10-CM | POA: Diagnosis not present

## 2023-05-11 DIAGNOSIS — G4733 Obstructive sleep apnea (adult) (pediatric): Secondary | ICD-10-CM | POA: Diagnosis not present

## 2023-05-11 DIAGNOSIS — G309 Alzheimer's disease, unspecified: Secondary | ICD-10-CM | POA: Diagnosis not present

## 2023-05-11 DIAGNOSIS — F02818 Dementia in other diseases classified elsewhere, unspecified severity, with other behavioral disturbance: Secondary | ICD-10-CM | POA: Diagnosis not present

## 2023-05-11 DIAGNOSIS — I272 Pulmonary hypertension, unspecified: Secondary | ICD-10-CM | POA: Diagnosis not present

## 2023-05-11 DIAGNOSIS — N183 Chronic kidney disease, stage 3 unspecified: Secondary | ICD-10-CM | POA: Diagnosis not present

## 2023-05-14 DIAGNOSIS — S098XXD Other specified injuries of head, subsequent encounter: Secondary | ICD-10-CM | POA: Diagnosis not present

## 2023-05-14 DIAGNOSIS — M6281 Muscle weakness (generalized): Secondary | ICD-10-CM | POA: Diagnosis not present

## 2023-05-14 DIAGNOSIS — N39498 Other specified urinary incontinence: Secondary | ICD-10-CM | POA: Diagnosis not present

## 2023-05-14 DIAGNOSIS — F02818 Dementia in other diseases classified elsewhere, unspecified severity, with other behavioral disturbance: Secondary | ICD-10-CM | POA: Diagnosis not present

## 2023-05-14 DIAGNOSIS — N183 Chronic kidney disease, stage 3 unspecified: Secondary | ICD-10-CM | POA: Diagnosis not present

## 2023-05-14 DIAGNOSIS — N401 Enlarged prostate with lower urinary tract symptoms: Secondary | ICD-10-CM | POA: Diagnosis not present

## 2023-05-14 DIAGNOSIS — Z9181 History of falling: Secondary | ICD-10-CM | POA: Diagnosis not present

## 2023-05-14 DIAGNOSIS — I10 Essential (primary) hypertension: Secondary | ICD-10-CM | POA: Diagnosis not present

## 2023-05-14 DIAGNOSIS — I272 Pulmonary hypertension, unspecified: Secondary | ICD-10-CM | POA: Diagnosis not present

## 2023-05-14 DIAGNOSIS — S0101XD Laceration without foreign body of scalp, subsequent encounter: Secondary | ICD-10-CM | POA: Diagnosis not present

## 2023-05-14 DIAGNOSIS — G309 Alzheimer's disease, unspecified: Secondary | ICD-10-CM | POA: Diagnosis not present

## 2023-05-14 DIAGNOSIS — R54 Age-related physical debility: Secondary | ICD-10-CM | POA: Diagnosis not present

## 2023-05-14 DIAGNOSIS — R296 Repeated falls: Secondary | ICD-10-CM | POA: Diagnosis not present

## 2023-05-14 DIAGNOSIS — E785 Hyperlipidemia, unspecified: Secondary | ICD-10-CM | POA: Diagnosis not present

## 2023-05-14 DIAGNOSIS — G4733 Obstructive sleep apnea (adult) (pediatric): Secondary | ICD-10-CM | POA: Diagnosis not present

## 2023-05-14 DIAGNOSIS — K219 Gastro-esophageal reflux disease without esophagitis: Secondary | ICD-10-CM | POA: Diagnosis not present

## 2023-05-14 DIAGNOSIS — M48061 Spinal stenosis, lumbar region without neurogenic claudication: Secondary | ICD-10-CM | POA: Diagnosis not present

## 2023-05-16 DIAGNOSIS — E785 Hyperlipidemia, unspecified: Secondary | ICD-10-CM | POA: Diagnosis not present

## 2023-05-16 DIAGNOSIS — Z9181 History of falling: Secondary | ICD-10-CM | POA: Diagnosis not present

## 2023-05-16 DIAGNOSIS — N183 Chronic kidney disease, stage 3 unspecified: Secondary | ICD-10-CM | POA: Diagnosis not present

## 2023-05-16 DIAGNOSIS — G309 Alzheimer's disease, unspecified: Secondary | ICD-10-CM | POA: Diagnosis not present

## 2023-05-16 DIAGNOSIS — M48061 Spinal stenosis, lumbar region without neurogenic claudication: Secondary | ICD-10-CM | POA: Diagnosis not present

## 2023-05-16 DIAGNOSIS — G4733 Obstructive sleep apnea (adult) (pediatric): Secondary | ICD-10-CM | POA: Diagnosis not present

## 2023-05-16 DIAGNOSIS — N39498 Other specified urinary incontinence: Secondary | ICD-10-CM | POA: Diagnosis not present

## 2023-05-16 DIAGNOSIS — I272 Pulmonary hypertension, unspecified: Secondary | ICD-10-CM | POA: Diagnosis not present

## 2023-05-16 DIAGNOSIS — K219 Gastro-esophageal reflux disease without esophagitis: Secondary | ICD-10-CM | POA: Diagnosis not present

## 2023-05-16 DIAGNOSIS — F02818 Dementia in other diseases classified elsewhere, unspecified severity, with other behavioral disturbance: Secondary | ICD-10-CM | POA: Diagnosis not present

## 2023-05-16 DIAGNOSIS — N401 Enlarged prostate with lower urinary tract symptoms: Secondary | ICD-10-CM | POA: Diagnosis not present

## 2023-05-16 DIAGNOSIS — S098XXD Other specified injuries of head, subsequent encounter: Secondary | ICD-10-CM | POA: Diagnosis not present

## 2023-05-21 DIAGNOSIS — N401 Enlarged prostate with lower urinary tract symptoms: Secondary | ICD-10-CM | POA: Diagnosis not present

## 2023-05-21 DIAGNOSIS — Z9181 History of falling: Secondary | ICD-10-CM | POA: Diagnosis not present

## 2023-05-21 DIAGNOSIS — N183 Chronic kidney disease, stage 3 unspecified: Secondary | ICD-10-CM | POA: Diagnosis not present

## 2023-05-21 DIAGNOSIS — E785 Hyperlipidemia, unspecified: Secondary | ICD-10-CM | POA: Diagnosis not present

## 2023-05-21 DIAGNOSIS — K219 Gastro-esophageal reflux disease without esophagitis: Secondary | ICD-10-CM | POA: Diagnosis not present

## 2023-05-21 DIAGNOSIS — G309 Alzheimer's disease, unspecified: Secondary | ICD-10-CM | POA: Diagnosis not present

## 2023-05-21 DIAGNOSIS — N39498 Other specified urinary incontinence: Secondary | ICD-10-CM | POA: Diagnosis not present

## 2023-05-21 DIAGNOSIS — I272 Pulmonary hypertension, unspecified: Secondary | ICD-10-CM | POA: Diagnosis not present

## 2023-05-21 DIAGNOSIS — F02818 Dementia in other diseases classified elsewhere, unspecified severity, with other behavioral disturbance: Secondary | ICD-10-CM | POA: Diagnosis not present

## 2023-05-21 DIAGNOSIS — S098XXD Other specified injuries of head, subsequent encounter: Secondary | ICD-10-CM | POA: Diagnosis not present

## 2023-05-21 DIAGNOSIS — G4733 Obstructive sleep apnea (adult) (pediatric): Secondary | ICD-10-CM | POA: Diagnosis not present

## 2023-05-21 DIAGNOSIS — M48061 Spinal stenosis, lumbar region without neurogenic claudication: Secondary | ICD-10-CM | POA: Diagnosis not present

## 2023-05-23 DIAGNOSIS — N183 Chronic kidney disease, stage 3 unspecified: Secondary | ICD-10-CM | POA: Diagnosis not present

## 2023-05-23 DIAGNOSIS — S098XXD Other specified injuries of head, subsequent encounter: Secondary | ICD-10-CM | POA: Diagnosis not present

## 2023-05-23 DIAGNOSIS — G309 Alzheimer's disease, unspecified: Secondary | ICD-10-CM | POA: Diagnosis not present

## 2023-05-23 DIAGNOSIS — G4733 Obstructive sleep apnea (adult) (pediatric): Secondary | ICD-10-CM | POA: Diagnosis not present

## 2023-05-23 DIAGNOSIS — F02818 Dementia in other diseases classified elsewhere, unspecified severity, with other behavioral disturbance: Secondary | ICD-10-CM | POA: Diagnosis not present

## 2023-05-23 DIAGNOSIS — N401 Enlarged prostate with lower urinary tract symptoms: Secondary | ICD-10-CM | POA: Diagnosis not present

## 2023-05-23 DIAGNOSIS — I272 Pulmonary hypertension, unspecified: Secondary | ICD-10-CM | POA: Diagnosis not present

## 2023-05-23 DIAGNOSIS — N39498 Other specified urinary incontinence: Secondary | ICD-10-CM | POA: Diagnosis not present

## 2023-05-23 DIAGNOSIS — E785 Hyperlipidemia, unspecified: Secondary | ICD-10-CM | POA: Diagnosis not present

## 2023-05-23 DIAGNOSIS — M48061 Spinal stenosis, lumbar region without neurogenic claudication: Secondary | ICD-10-CM | POA: Diagnosis not present

## 2023-05-23 DIAGNOSIS — K219 Gastro-esophageal reflux disease without esophagitis: Secondary | ICD-10-CM | POA: Diagnosis not present

## 2023-05-23 DIAGNOSIS — Z9181 History of falling: Secondary | ICD-10-CM | POA: Diagnosis not present

## 2023-05-24 DIAGNOSIS — M25532 Pain in left wrist: Secondary | ICD-10-CM | POA: Diagnosis not present

## 2023-05-24 DIAGNOSIS — M79642 Pain in left hand: Secondary | ICD-10-CM | POA: Diagnosis not present

## 2023-05-25 DIAGNOSIS — I10 Essential (primary) hypertension: Secondary | ICD-10-CM | POA: Diagnosis not present

## 2023-05-25 DIAGNOSIS — S60222A Contusion of left hand, initial encounter: Secondary | ICD-10-CM | POA: Diagnosis not present

## 2023-05-25 DIAGNOSIS — S63592A Other specified sprain of left wrist, initial encounter: Secondary | ICD-10-CM | POA: Diagnosis not present

## 2023-05-28 DIAGNOSIS — R319 Hematuria, unspecified: Secondary | ICD-10-CM | POA: Diagnosis not present

## 2023-05-28 DIAGNOSIS — I10 Essential (primary) hypertension: Secondary | ICD-10-CM | POA: Diagnosis not present

## 2023-05-28 DIAGNOSIS — G301 Alzheimer's disease with late onset: Secondary | ICD-10-CM | POA: Diagnosis not present

## 2023-05-28 DIAGNOSIS — G4733 Obstructive sleep apnea (adult) (pediatric): Secondary | ICD-10-CM | POA: Diagnosis not present

## 2023-05-28 DIAGNOSIS — I272 Pulmonary hypertension, unspecified: Secondary | ICD-10-CM | POA: Diagnosis not present

## 2023-05-28 DIAGNOSIS — E440 Moderate protein-calorie malnutrition: Secondary | ICD-10-CM | POA: Diagnosis not present

## 2023-05-28 DIAGNOSIS — N183 Chronic kidney disease, stage 3 unspecified: Secondary | ICD-10-CM | POA: Diagnosis not present

## 2023-06-04 DIAGNOSIS — R54 Age-related physical debility: Secondary | ICD-10-CM | POA: Diagnosis not present

## 2023-06-04 DIAGNOSIS — G309 Alzheimer's disease, unspecified: Secondary | ICD-10-CM | POA: Diagnosis not present

## 2023-06-04 DIAGNOSIS — M6281 Muscle weakness (generalized): Secondary | ICD-10-CM | POA: Diagnosis not present

## 2023-06-04 DIAGNOSIS — R296 Repeated falls: Secondary | ICD-10-CM | POA: Diagnosis not present

## 2023-06-05 DIAGNOSIS — G301 Alzheimer's disease with late onset: Secondary | ICD-10-CM | POA: Diagnosis not present

## 2023-06-05 DIAGNOSIS — F331 Major depressive disorder, recurrent, moderate: Secondary | ICD-10-CM | POA: Diagnosis not present

## 2023-06-05 DIAGNOSIS — F02B18 Dementia in other diseases classified elsewhere, moderate, with other behavioral disturbance: Secondary | ICD-10-CM | POA: Diagnosis not present

## 2023-07-04 DIAGNOSIS — M6281 Muscle weakness (generalized): Secondary | ICD-10-CM | POA: Diagnosis not present

## 2023-07-04 DIAGNOSIS — R54 Age-related physical debility: Secondary | ICD-10-CM | POA: Diagnosis not present

## 2023-07-04 DIAGNOSIS — G309 Alzheimer's disease, unspecified: Secondary | ICD-10-CM | POA: Diagnosis not present

## 2023-07-04 DIAGNOSIS — R296 Repeated falls: Secondary | ICD-10-CM | POA: Diagnosis not present

## 2023-07-13 ENCOUNTER — Telehealth: Payer: Self-pay | Admitting: Internal Medicine

## 2023-07-13 NOTE — Telephone Encounter (Signed)
Patient's daughter came by the office to let Dr. Ardyth Harps know that patient passed away.

## 2023-07-25 DEATH — deceased

## 2023-07-31 NOTE — Telephone Encounter (Signed)
 Must have the date the patient passed away to mark the chart decease

## 2023-08-01 NOTE — Telephone Encounter (Signed)
 Spoke to wife and Mr Cumbie past away 07/07/23.
